# Patient Record
Sex: Female | Born: 1950
Health system: Southern US, Community
[De-identification: ages and names within clinical notes are randomized; demographics above are authoritative.]

## PROBLEM LIST (undated history)

## (undated) DIAGNOSIS — G43909 Migraine, unspecified, not intractable, without status migrainosus: Secondary | ICD-10-CM

## (undated) DIAGNOSIS — R7303 Prediabetes: Secondary | ICD-10-CM

## (undated) DIAGNOSIS — H269 Unspecified cataract: Secondary | ICD-10-CM

## (undated) DIAGNOSIS — E7800 Pure hypercholesterolemia, unspecified: Secondary | ICD-10-CM

## (undated) DIAGNOSIS — J189 Pneumonia, unspecified organism: Secondary | ICD-10-CM

## (undated) DIAGNOSIS — F32A Depression, unspecified: Secondary | ICD-10-CM

## (undated) DIAGNOSIS — I10 Essential (primary) hypertension: Secondary | ICD-10-CM

## (undated) DIAGNOSIS — F439 Reaction to severe stress, unspecified: Secondary | ICD-10-CM

## (undated) DIAGNOSIS — F329 Major depressive disorder, single episode, unspecified: Secondary | ICD-10-CM

## (undated) DIAGNOSIS — G40409 Other generalized epilepsy and epileptic syndromes, not intractable, without status epilepticus: Secondary | ICD-10-CM

## (undated) DIAGNOSIS — G40309 Generalized idiopathic epilepsy and epileptic syndromes, not intractable, without status epilepticus: Secondary | ICD-10-CM

## (undated) DIAGNOSIS — R569 Unspecified convulsions: Secondary | ICD-10-CM

## (undated) DIAGNOSIS — F0390 Unspecified dementia without behavioral disturbance: Secondary | ICD-10-CM

## (undated) DIAGNOSIS — T148XXA Other injury of unspecified body region, initial encounter: Secondary | ICD-10-CM

## (undated) HISTORY — PX: VAGINAL HYSTERECTOMY: SUR661

## (undated) HISTORY — DX: Unspecified cataract: H26.9

## (undated) HISTORY — DX: Pure hypercholesterolemia, unspecified: E78.00

## (undated) HISTORY — PX: HERNIA REPAIR: SHX51

---

## 1998-07-08 ENCOUNTER — Ambulatory Visit (HOSPITAL_COMMUNITY): Admission: RE | Admit: 1998-07-08 | Discharge: 1998-07-08 | Payer: Self-pay | Admitting: Family Medicine

## 1998-12-31 ENCOUNTER — Emergency Department (HOSPITAL_COMMUNITY): Admission: EM | Admit: 1998-12-31 | Discharge: 1998-12-31 | Payer: Self-pay

## 1999-05-17 ENCOUNTER — Ambulatory Visit: Admission: RE | Admit: 1999-05-17 | Discharge: 1999-05-17 | Payer: Self-pay | Admitting: *Deleted

## 1999-06-25 ENCOUNTER — Emergency Department (HOSPITAL_COMMUNITY): Admission: EM | Admit: 1999-06-25 | Discharge: 1999-06-25 | Payer: Self-pay | Admitting: Emergency Medicine

## 1999-09-07 ENCOUNTER — Ambulatory Visit (HOSPITAL_COMMUNITY): Admission: RE | Admit: 1999-09-07 | Discharge: 1999-09-07 | Payer: Self-pay | Admitting: Gastroenterology

## 1999-09-07 ENCOUNTER — Encounter (INDEPENDENT_AMBULATORY_CARE_PROVIDER_SITE_OTHER): Payer: Self-pay | Admitting: Specialist

## 1999-11-03 ENCOUNTER — Encounter: Payer: Self-pay | Admitting: Family Medicine

## 1999-11-03 ENCOUNTER — Ambulatory Visit (HOSPITAL_COMMUNITY): Admission: RE | Admit: 1999-11-03 | Discharge: 1999-11-03 | Payer: Self-pay | Admitting: Family Medicine

## 1999-11-28 ENCOUNTER — Ambulatory Visit (HOSPITAL_COMMUNITY): Admission: RE | Admit: 1999-11-28 | Discharge: 1999-11-28 | Payer: Self-pay | Admitting: Gastroenterology

## 1999-11-28 ENCOUNTER — Encounter: Payer: Self-pay | Admitting: Gastroenterology

## 2000-10-14 ENCOUNTER — Emergency Department (HOSPITAL_COMMUNITY): Admission: EM | Admit: 2000-10-14 | Discharge: 2000-10-14 | Payer: Self-pay | Admitting: Emergency Medicine

## 2000-10-14 ENCOUNTER — Encounter: Payer: Self-pay | Admitting: Emergency Medicine

## 2000-11-06 ENCOUNTER — Ambulatory Visit (HOSPITAL_COMMUNITY): Admission: RE | Admit: 2000-11-06 | Discharge: 2000-11-06 | Payer: Self-pay | Admitting: Family Medicine

## 2000-11-06 ENCOUNTER — Encounter: Payer: Self-pay | Admitting: Family Medicine

## 2001-11-18 ENCOUNTER — Encounter: Payer: Self-pay | Admitting: Family Medicine

## 2001-11-18 ENCOUNTER — Ambulatory Visit (HOSPITAL_COMMUNITY): Admission: RE | Admit: 2001-11-18 | Discharge: 2001-11-18 | Payer: Self-pay | Admitting: Family Medicine

## 2002-09-06 ENCOUNTER — Emergency Department (HOSPITAL_COMMUNITY): Admission: EM | Admit: 2002-09-06 | Discharge: 2002-09-06 | Payer: Self-pay | Admitting: Emergency Medicine

## 2002-12-03 ENCOUNTER — Encounter: Payer: Self-pay | Admitting: Family Medicine

## 2002-12-03 ENCOUNTER — Ambulatory Visit (HOSPITAL_COMMUNITY): Admission: RE | Admit: 2002-12-03 | Discharge: 2002-12-03 | Payer: Self-pay | Admitting: Family Medicine

## 2003-01-22 ENCOUNTER — Emergency Department (HOSPITAL_COMMUNITY): Admission: EM | Admit: 2003-01-22 | Discharge: 2003-01-22 | Payer: Self-pay

## 2003-01-26 ENCOUNTER — Encounter: Payer: Self-pay | Admitting: Family Medicine

## 2003-01-26 ENCOUNTER — Ambulatory Visit (HOSPITAL_COMMUNITY): Admission: RE | Admit: 2003-01-26 | Discharge: 2003-01-26 | Payer: Self-pay | Admitting: Family Medicine

## 2003-02-01 ENCOUNTER — Ambulatory Visit (HOSPITAL_COMMUNITY): Admission: RE | Admit: 2003-02-01 | Discharge: 2003-02-01 | Payer: Self-pay | Admitting: Family Medicine

## 2003-02-01 ENCOUNTER — Encounter: Payer: Self-pay | Admitting: Family Medicine

## 2003-08-21 ENCOUNTER — Encounter: Payer: Self-pay | Admitting: Emergency Medicine

## 2003-08-21 ENCOUNTER — Emergency Department (HOSPITAL_COMMUNITY): Admission: EM | Admit: 2003-08-21 | Discharge: 2003-08-21 | Payer: Self-pay

## 2003-12-16 ENCOUNTER — Emergency Department (HOSPITAL_COMMUNITY): Admission: EM | Admit: 2003-12-16 | Discharge: 2003-12-16 | Payer: Self-pay | Admitting: Emergency Medicine

## 2004-04-06 ENCOUNTER — Ambulatory Visit (HOSPITAL_COMMUNITY): Admission: RE | Admit: 2004-04-06 | Discharge: 2004-04-06 | Payer: Self-pay | Admitting: Family Medicine

## 2004-06-16 ENCOUNTER — Emergency Department (HOSPITAL_COMMUNITY): Admission: EM | Admit: 2004-06-16 | Discharge: 2004-06-16 | Payer: Self-pay | Admitting: Emergency Medicine

## 2005-03-29 ENCOUNTER — Ambulatory Visit (HOSPITAL_COMMUNITY): Admission: RE | Admit: 2005-03-29 | Discharge: 2005-03-29 | Payer: Self-pay | Admitting: Gastroenterology

## 2005-04-11 ENCOUNTER — Ambulatory Visit (HOSPITAL_COMMUNITY): Admission: RE | Admit: 2005-04-11 | Discharge: 2005-04-11 | Payer: Self-pay | Admitting: Family Medicine

## 2005-10-06 ENCOUNTER — Emergency Department (HOSPITAL_COMMUNITY): Admission: EM | Admit: 2005-10-06 | Discharge: 2005-10-06 | Payer: Self-pay | Admitting: Emergency Medicine

## 2006-03-11 ENCOUNTER — Other Ambulatory Visit: Admission: RE | Admit: 2006-03-11 | Discharge: 2006-03-11 | Payer: Self-pay | Admitting: Family Medicine

## 2006-06-25 ENCOUNTER — Ambulatory Visit (HOSPITAL_COMMUNITY): Admission: RE | Admit: 2006-06-25 | Discharge: 2006-06-25 | Payer: Self-pay | Admitting: Family Medicine

## 2006-09-03 ENCOUNTER — Emergency Department (HOSPITAL_COMMUNITY): Admission: EM | Admit: 2006-09-03 | Discharge: 2006-09-04 | Payer: Self-pay | Admitting: Emergency Medicine

## 2006-09-12 ENCOUNTER — Emergency Department (HOSPITAL_COMMUNITY): Admission: EM | Admit: 2006-09-12 | Discharge: 2006-09-13 | Payer: Self-pay | Admitting: Emergency Medicine

## 2007-04-29 ENCOUNTER — Ambulatory Visit (HOSPITAL_COMMUNITY): Admission: RE | Admit: 2007-04-29 | Discharge: 2007-04-29 | Payer: Self-pay | Admitting: Family Medicine

## 2008-02-02 ENCOUNTER — Ambulatory Visit (HOSPITAL_COMMUNITY): Admission: RE | Admit: 2008-02-02 | Discharge: 2008-02-02 | Payer: Self-pay | Admitting: Family Medicine

## 2008-02-12 ENCOUNTER — Observation Stay (HOSPITAL_COMMUNITY): Admission: AD | Admit: 2008-02-12 | Discharge: 2008-02-12 | Payer: Self-pay | Admitting: Internal Medicine

## 2008-10-14 ENCOUNTER — Ambulatory Visit: Payer: Self-pay | Admitting: Internal Medicine

## 2008-10-25 LAB — CBC & DIFF AND RETIC
BASO%: 0.6 % (ref 0.0–2.0)
Basophils Absolute: 0 10*3/uL (ref 0.0–0.1)
EOS%: 0.7 % (ref 0.0–7.0)
Eosinophils Absolute: 0.1 10*3/uL (ref 0.0–0.5)
HCT: 35 % (ref 34.8–46.6)
HGB: 11.7 g/dL (ref 11.6–15.9)
IRF: 0.32 (ref 0.130–0.330)
LYMPH%: 35.1 % (ref 14.0–48.0)
MCH: 30 pg (ref 26.0–34.0)
MCHC: 33.4 g/dL (ref 32.0–36.0)
MCV: 89.9 fL (ref 81.0–101.0)
MONO#: 0.7 10*3/uL (ref 0.1–0.9)
MONO%: 10 % (ref 0.0–13.0)
NEUT#: 3.7 10*3/uL (ref 1.5–6.5)
NEUT%: 53.6 % (ref 39.6–76.8)
Platelets: 331 10*3/uL (ref 145–400)
RBC: 3.9 10*6/uL (ref 3.70–5.32)
RDW: 13.6 % (ref 11.3–14.5)
RETIC #: 28.9 10*3/uL (ref 19.7–115.1)
Retic %: 0.7 % (ref 0.4–2.3)
WBC: 7 10*3/uL (ref 3.9–10.0)
lymph#: 2.4 10*3/uL (ref 0.9–3.3)

## 2008-10-27 LAB — COMPREHENSIVE METABOLIC PANEL
ALT: 12 U/L (ref 0–35)
AST: 16 U/L (ref 0–37)
Albumin: 4.6 g/dL (ref 3.5–5.2)
Alkaline Phosphatase: 52 U/L (ref 39–117)
BUN: 29 mg/dL — ABNORMAL HIGH (ref 6–23)
CO2: 27 mEq/L (ref 19–32)
Calcium: 11.3 mg/dL — ABNORMAL HIGH (ref 8.4–10.5)
Chloride: 99 mEq/L (ref 96–112)
Creatinine, Ser: 1.75 mg/dL — ABNORMAL HIGH (ref 0.40–1.20)
Glucose, Bld: 88 mg/dL (ref 70–99)
Potassium: 3.8 mEq/L (ref 3.5–5.3)
Sodium: 140 mEq/L (ref 135–145)
Total Bilirubin: 0.4 mg/dL (ref 0.3–1.2)
Total Protein: 7.6 g/dL (ref 6.0–8.3)

## 2008-10-27 LAB — PROTEIN ELECTROPHORESIS, SERUM
Albumin ELP: 56.2 % (ref 55.8–66.1)
Alpha-1-Globulin: 5.7 % — ABNORMAL HIGH (ref 2.9–4.9)
Alpha-2-Globulin: 14.2 % — ABNORMAL HIGH (ref 7.1–11.8)
Beta 2: 6 % (ref 3.2–6.5)
Beta Globulin: 5.8 % (ref 4.7–7.2)
Gamma Globulin: 12.1 % (ref 11.1–18.8)
Total Protein, Serum Electrophoresis: 7.6 g/dL (ref 6.0–8.3)

## 2008-10-27 LAB — IRON AND TIBC
%SAT: 35 % (ref 20–55)
Iron: 112 ug/dL (ref 42–145)
TIBC: 321 ug/dL (ref 250–470)
UIBC: 209 ug/dL

## 2008-10-27 LAB — HEMOGLOBINOPATHY EVALUATION
Hemoglobin Other: 0 % (ref 0.0–0.0)
Hgb A2 Quant: 2.1 % — ABNORMAL LOW (ref 2.2–3.2)
Hgb A: 97.6 % (ref 96.8–97.8)
Hgb F Quant: 0.3 % (ref 0.0–2.0)
Hgb S Quant: 0 % (ref 0.0–0.0)

## 2008-10-27 LAB — LACTATE DEHYDROGENASE: LDH: 123 U/L (ref 94–250)

## 2008-10-27 LAB — FOLATE RBC: RBC Folate: 936 ng/mL — ABNORMAL HIGH (ref 180–600)

## 2008-10-27 LAB — FERRITIN: Ferritin: 78 ng/mL (ref 10–291)

## 2008-10-27 LAB — VITAMIN B12: Vitamin B-12: 780 pg/mL (ref 211–911)

## 2008-10-27 LAB — ERYTHROPOIETIN: Erythropoietin: 5 m[IU]/mL (ref 2.6–34.0)

## 2008-11-02 LAB — CBC WITH DIFFERENTIAL/PLATELET
BASO%: 0.5 % (ref 0.0–2.0)
Basophils Absolute: 0 10*3/uL (ref 0.0–0.1)
EOS%: 0.6 % (ref 0.0–7.0)
Eosinophils Absolute: 0 10*3/uL (ref 0.0–0.5)
HCT: 32.7 % — ABNORMAL LOW (ref 34.8–46.6)
HGB: 10.9 g/dL — ABNORMAL LOW (ref 11.6–15.9)
LYMPH%: 25.3 % (ref 14.0–48.0)
MCH: 29.9 pg (ref 26.0–34.0)
MCHC: 33.2 g/dL (ref 32.0–36.0)
MCV: 90 fL (ref 81.0–101.0)
MONO#: 0.7 10*3/uL (ref 0.1–0.9)
MONO%: 8.6 % (ref 0.0–13.0)
NEUT#: 5 10*3/uL (ref 1.5–6.5)
NEUT%: 65 % (ref 39.6–76.8)
Platelets: 299 10*3/uL (ref 145–400)
RBC: 3.63 10*6/uL — ABNORMAL LOW (ref 3.70–5.32)
RDW: 13.4 % (ref 11.3–14.5)
WBC: 7.7 10*3/uL (ref 3.9–10.0)
lymph#: 1.9 10*3/uL (ref 0.9–3.3)

## 2009-03-01 ENCOUNTER — Ambulatory Visit (HOSPITAL_COMMUNITY): Admission: RE | Admit: 2009-03-01 | Discharge: 2009-03-01 | Payer: Self-pay | Admitting: Family Medicine

## 2009-05-08 ENCOUNTER — Emergency Department (HOSPITAL_COMMUNITY): Admission: EM | Admit: 2009-05-08 | Discharge: 2009-05-08 | Payer: Self-pay | Admitting: Emergency Medicine

## 2009-05-18 ENCOUNTER — Emergency Department (HOSPITAL_COMMUNITY): Admission: EM | Admit: 2009-05-18 | Discharge: 2009-05-18 | Payer: Self-pay | Admitting: Emergency Medicine

## 2009-07-08 ENCOUNTER — Emergency Department (HOSPITAL_COMMUNITY): Admission: EM | Admit: 2009-07-08 | Discharge: 2009-07-08 | Payer: Self-pay | Admitting: Emergency Medicine

## 2009-09-15 ENCOUNTER — Emergency Department (HOSPITAL_COMMUNITY): Admission: EM | Admit: 2009-09-15 | Discharge: 2009-09-16 | Payer: Self-pay | Admitting: Emergency Medicine

## 2009-09-15 ENCOUNTER — Encounter: Admission: RE | Admit: 2009-09-15 | Discharge: 2009-09-15 | Payer: Self-pay | Admitting: Family Medicine

## 2010-01-02 ENCOUNTER — Other Ambulatory Visit: Admission: RE | Admit: 2010-01-02 | Discharge: 2010-01-02 | Payer: Self-pay | Admitting: Family Medicine

## 2010-03-02 ENCOUNTER — Ambulatory Visit (HOSPITAL_COMMUNITY): Admission: RE | Admit: 2010-03-02 | Discharge: 2010-03-02 | Payer: Self-pay | Admitting: Family Medicine

## 2010-04-25 ENCOUNTER — Encounter: Admission: RE | Admit: 2010-04-25 | Discharge: 2010-04-25 | Payer: Self-pay | Admitting: Family Medicine

## 2011-02-01 ENCOUNTER — Emergency Department (HOSPITAL_COMMUNITY)
Admission: EM | Admit: 2011-02-01 | Discharge: 2011-02-01 | Disposition: A | Discharge status: Home / Self Care | Payer: BC Managed Care – PPO | Attending: Emergency Medicine | Admitting: Emergency Medicine

## 2011-02-01 DIAGNOSIS — E785 Hyperlipidemia, unspecified: Secondary | ICD-10-CM | POA: Insufficient documentation

## 2011-02-01 DIAGNOSIS — G43909 Migraine, unspecified, not intractable, without status migrainosus: Secondary | ICD-10-CM | POA: Insufficient documentation

## 2011-02-01 DIAGNOSIS — H53149 Visual discomfort, unspecified: Secondary | ICD-10-CM | POA: Insufficient documentation

## 2011-02-01 DIAGNOSIS — Z79899 Other long term (current) drug therapy: Secondary | ICD-10-CM | POA: Insufficient documentation

## 2011-02-01 DIAGNOSIS — K219 Gastro-esophageal reflux disease without esophagitis: Secondary | ICD-10-CM | POA: Insufficient documentation

## 2011-02-01 DIAGNOSIS — I10 Essential (primary) hypertension: Secondary | ICD-10-CM | POA: Insufficient documentation

## 2011-02-01 DIAGNOSIS — E7800 Pure hypercholesterolemia, unspecified: Secondary | ICD-10-CM | POA: Insufficient documentation

## 2011-02-01 DIAGNOSIS — G40909 Epilepsy, unspecified, not intractable, without status epilepticus: Secondary | ICD-10-CM | POA: Insufficient documentation

## 2011-02-28 ENCOUNTER — Other Ambulatory Visit (HOSPITAL_COMMUNITY): Payer: Self-pay | Admitting: Family Medicine

## 2011-02-28 DIAGNOSIS — Z1231 Encounter for screening mammogram for malignant neoplasm of breast: Secondary | ICD-10-CM

## 2011-03-14 ENCOUNTER — Ambulatory Visit (HOSPITAL_COMMUNITY)
Admission: RE | Admit: 2011-03-14 | Discharge: 2011-03-14 | Disposition: A | Discharge status: Home / Self Care | Payer: BC Managed Care – PPO | Source: Ambulatory Visit | Attending: Family Medicine | Admitting: Family Medicine

## 2011-03-14 DIAGNOSIS — Z1231 Encounter for screening mammogram for malignant neoplasm of breast: Secondary | ICD-10-CM

## 2011-05-08 NOTE — Discharge Summary (Signed)
NAMEMERCEDEZ, BOULE                ACCOUNT NO.:  [id number]   MEDICAL RECORD NO.:  [id number]          PATIENT TYPE:  EMS   LOCATION:  ED                           FACILITY:  [facility]   PHYSICIAN:  Corinna L. Lendell Caprice, MDDATE OF BIRTH:  [birth year]   DATE OF ADMISSION:  02/11/2008  DATE OF DISCHARGE:  02/12/2008                               DISCHARGE SUMMARY   DISCHARGE DIAGNOSES:  1. Chest pain, suspect gastrointestinal etiology.  2. Gastroesophageal reflux disease.  3. Seizure disorder.  4. History of migraines.  5. Hypertension.   DISCHARGE MEDICATIONS:  The same as upon admission, but if she continues  to have symptoms, I have encouraged her to discuss stopping Indocin with  Dr. Meryl Crutch.   CONDITION:  Stable.   ACTIVITY:  Ad lib.   FOLLOWUP:  Follow up with Dr. Meryl Crutch and/or Dr. Katrinka Blazing as needed.   DIET:  Low salt.   CONSULTATIONS:  None.   PROCEDURES:  None.   LABORATORY DATA:  CBC unremarkable.  D-dimer less than 0.22.  Complete  metabolic panel significant for a glucose of 155; otherwise,  unremarkable.  Lipase normal.  Serial cardiac enzymes negative.   SPECIAL STUDIES/RADIOLOGY:  EKG showed normal sinus rhythm with  nonspecific changes.  Chest x-ray negative.   HISTORY AND HOSPITAL COURSE:  Ms. Glazer is a 60 year old female patient  of Dr. Katrinka Blazing who presented with intermittent substernal chest pain  several times a day for the past 3 days.  She reports having had a  stress test a year ago with Dr. Carolanne Grumbling which was reportedly  normal.  I personally called Dr. Norris Cross office and had not yet  received the report of the stress test.  The patient's chest pain felt  like indigestion.  She has a history of reflux and ulcers and is on both  Cytotec and Nexium, but also takes Indocin.  Her chest pain was non  reproducible, and she had a normal exam, normal vital signs.  She had no  further chest pain.  I suspect this is possibly related to the Indocin  and  recommended that if she continues to have symptoms to discuss  alternate treatments for migraines.  Apparently, she suffers from  frequent severe migraines and his hesitant to stop this.  She was placed  on 23-hour observation on telemetry and remained in normal sinus rhythm.      Corinna L. Lendell Caprice, MD  Electronically Signed     CLS/MEDQ  D:  02/12/2008  T:  02/12/2008  Job:  [id number]   cc:   Dario Guardian, M.D.

## 2011-05-08 NOTE — H&P (Signed)
[name], [name]                ACCOUNT NO.:  [id number]   MEDICAL RECORD NO.:  [id number]          PATIENT TYPE:  EMS   LOCATION:  ED                           FACILITY:  [facility]   PHYSICIAN:  [name], MDDATE OF BIRTH:  02-17-1951   DATE OF ADMISSION:  02/11/2008  DATE OF DISCHARGE:                              HISTORY & PHYSICAL   CHIEF COMPLAINT:  Chest pain.   HISTORY OF PRESENT ILLNESS:  [name] is a 60 year old female patient  of [name] who presents with intermittent chest pain for the  past several days.  It lasts about a minute each time and feels like  indigestion.  Initially, she told the resident working in the ER that it  was exertional.  She tells me that there is no correlation to exertion.  She feels short of breath with it and she cannot recall having had this  sensation before, but she thought it may be indigestion.  She reportedly  had a stress test at [name]'s office last year which was  reportedly negative.  She cannot recall whether she had chest pain at  the time, but her husband reports that she did.  She thinks that the  test was normal.  She has no chest pain currently.  She has a history of  acid reflux and ulcers.  She has had no cough.  No fevers or chills.  No  wheezing.   PAST MEDICAL HISTORY:  1. Hyperlipidemia.  2. Gastroesophageal reflux disease.  3. History of peptic ulcers.  4. Migraine headaches.  5. Hypertension.  6. Seizure disorder.   MEDICATIONS:  1. Lamictal 200 mg twice a day.  2. Cytotec 200 mg twice a day.  3. Indocin 25 mg twice a day.  4. Lipitor 40 mg a day.  5. Verapamil 240 mg a day.  6. Atenolol 100 mg a day.  7. Nexium 40 mg a day.  8. Benicar/HCT 40/25 mg a day.  9. Zanaflex as needed.   ALLERGIES:  She reports an intolerance to ASPIRIN--ulcers.   SOCIAL HISTORY:  The patient works as a Advertising copywriter.  She denies  drinking, drugs or smoking.   FAMILY HISTORY:  Negative for early  coronary artery disease or clotting  disorders.   REVIEW OF SYSTEMS:  As above, otherwise negative.   PHYSICAL EXAMINATION:  VITAL SIGNS:  Temperature is 98.4, blood pressure  144/83, pulse 84, respiratory rate 16, oxygen saturation 97% on room  air.  GENERAL:  The patient is well-nourished, well-developed in no acute  distress.  HEENT:  Normocephalic, atraumatic.  Pupils are equal, round and reactive  to light.  Sclerae are nonicteric.  Moist mucous membranes.  NECK:  Supple.  No carotid bruits.  LUNGS:  Clear to auscultation bilaterally without wheezes, rhonchi or  rales.  CARDIOVASCULAR:  Regular rate and rhythm without murmurs,  gallops or rubs.  No chest wall tenderness.  ABDOMEN:  Soft, nontender, nondistended.  GU/RECTAL:  Deferred.  EXTREMITIES:  No clubbing, cyanosis or edema.  SKIN:  No rash.  PSYCHIATRIC:  Flat affect.  NEUROLOGIC:  Alert and oriented.  Cranial nerves and sensorimotor exam  are intact.   LABORATORY DATA:  CBC unremarkable.  Basic metabolic panel significant  for a glucose of 155, otherwise unremarkable.  Point of care enzymes  negative.   DIAGNOSTICS:  1. EKG showed normal sinus rhythm.  2. Chest x-ray negative.   ASSESSMENT/PLAN:  1. Chest pain.  The patient will be placed on 23-hour observation.  I      will check liver function tests, lipase and D-dimer.  Rule out MI.      She will be placed on telemetry, give Plavix for now as she is      refusing aspirin.  Continue Nexium.  2. Hypertension.  Continue outpatient medications.  3. Seizure disorder.  Continue outpatient medications.  4. Gastroesophageal reflux disease and history of ulcers.  5. Hyperglycemia.  I will check a hemoglobin A1c.      Michelle L. Lendell Caprice, MD  Electronically Signed     CLS/MEDQ  D:  02/11/2008  T:  02/12/2008  Job:  [id number]   cc:   [name], M.D.  Fax: [phone]   [name], M.D.  Fax: [phone]

## 2011-05-11 NOTE — Op Note (Signed)
NAMEJANYIAH, [name]                ACCOUNT NO.:  [id number]   MEDICAL RECORD NO.:  [id number]          PATIENT TYPE:  AMB   LOCATION:  ENDO                         FACILITY:  [facility]   PHYSICIAN:  Petra Kuba, M.D.    DATE OF BIRTH:  Aug 24, 1951   DATE OF PROCEDURE:  03/29/2005  DATE OF DISCHARGE:                                 OPERATIVE REPORT   PROCEDURE:  Colonoscopy.   INDICATIONS:  Chronic anemia screening. Consent was signed after risks,  benefits, methods, and options thoroughly discussed in the office on  multiple occasions.   MEDICATIONS:  Demerol 60, Versed 6.   PROCEDURE:  Rectal inspection pertinent for small external hemorrhoids.  Digital exam was negative. Video pediatric adjustable colonoscope was  inserted, easily advanced around the colon to the cecum. This did require  some abdominal pressure but no position changes. On insertion, some  occasional left sided diverticula were seen. The ascending stricture was  seen before was seen and seem to be more widely patent in the past. The  pediatric colonoscopy easily passed through it and advanced to the cecum  which was identified by the appendiceal orifice and ileocecal valve. Scope  was inserted a short ways into the terminal ileum which was normal. Photo  documentation was obtained. The scope was slowly withdrawn. Prep was  adequate. There was some liquid stool that required washing and suctioning.  On slow withdrawn through the colon, an occasional left sided diverticula  and the ascending ring/stricture as discussed above. No other abnormalities  were seen as we slowly withdrew back to the rectum. The prep was adequate.  There was some liquid stool that required washing and suctioning. Anorectal  pull through and retroflexion confirmed some small hemorrhoids. Scope was  straightened and readvanced short ways up the left side of the colon. Air  was suctioned, scope removed. The patient tolerated the procedure well.  There was no obvious immediate complication.   ENDOSCOPIC DIAGNOSES:  1.  Internal and external hemorrhoids.  2.  Left sided occasional diverticula.  3.  Ring/stricture of the proximal ascending seen previously, widely patent      now.  4.  Otherwise within normal limits to the terminal ileum.   PLAN:  Return care to Dr. Katrinka Blazing for the customary health maintanence to  include yearly rectals and guaiacs. Repeat EGD p.r.n. Happy to see back  p.r.n. and otherwise followup colon probably in 5 to 10 years.      MEM/MEDQ  D:  03/29/2005  T:  03/29/2005  Job:  [id number]   cc:   Dario Guardian, M.D.  [address]  [place]  [place] [address]  Fax: [phone]

## 2011-06-03 ENCOUNTER — Emergency Department (HOSPITAL_COMMUNITY)
Admission: EM | Admit: 2011-06-03 | Discharge: 2011-06-04 | Disposition: A | Discharge status: Home / Self Care | Payer: BC Managed Care – PPO | Attending: Emergency Medicine | Admitting: Emergency Medicine

## 2011-06-03 DIAGNOSIS — K219 Gastro-esophageal reflux disease without esophagitis: Secondary | ICD-10-CM | POA: Insufficient documentation

## 2011-06-03 DIAGNOSIS — G43909 Migraine, unspecified, not intractable, without status migrainosus: Secondary | ICD-10-CM | POA: Insufficient documentation

## 2011-06-03 DIAGNOSIS — H53149 Visual discomfort, unspecified: Secondary | ICD-10-CM | POA: Insufficient documentation

## 2011-06-03 DIAGNOSIS — G40909 Epilepsy, unspecified, not intractable, without status epilepticus: Secondary | ICD-10-CM | POA: Insufficient documentation

## 2011-06-03 DIAGNOSIS — R110 Nausea: Secondary | ICD-10-CM | POA: Insufficient documentation

## 2011-06-03 DIAGNOSIS — Z79899 Other long term (current) drug therapy: Secondary | ICD-10-CM | POA: Insufficient documentation

## 2011-06-03 DIAGNOSIS — E7800 Pure hypercholesterolemia, unspecified: Secondary | ICD-10-CM | POA: Insufficient documentation

## 2011-06-03 DIAGNOSIS — E785 Hyperlipidemia, unspecified: Secondary | ICD-10-CM | POA: Insufficient documentation

## 2011-06-03 DIAGNOSIS — I10 Essential (primary) hypertension: Secondary | ICD-10-CM | POA: Insufficient documentation

## 2011-09-14 LAB — CBC
HCT: 35.4 — ABNORMAL LOW
Hemoglobin: 12
MCHC: 33.8
MCV: 88
Platelets: 341
RBC: 4.02
RDW: 13.1
WBC: 8.1

## 2011-09-14 LAB — BASIC METABOLIC PANEL
BUN: 16
CO2: 29
Calcium: 9.5
Chloride: 104
Creatinine, Ser: 1.16
GFR calc Af Amer: 58 — ABNORMAL LOW
GFR calc non Af Amer: 48 — ABNORMAL LOW
Glucose, Bld: 155 — ABNORMAL HIGH
Potassium: 3.7
Sodium: 141

## 2011-09-14 LAB — CK TOTAL AND CKMB (NOT AT ARMC)
CK, MB: 1.5
CK, MB: 2.1
Relative Index: 1.1
Relative Index: 1.2
Total CK: 140
Total CK: 176

## 2011-09-14 LAB — HEPATIC FUNCTION PANEL
ALT: 21
AST: 21
Albumin: 3.6
Alkaline Phosphatase: 57
Bilirubin, Direct: <0.1
Total Bilirubin: 0.3
Total Protein: 6.8

## 2011-09-14 LAB — D-DIMER, QUANTITATIVE: D-Dimer, Quant: <0.22

## 2011-09-14 LAB — HEMOGLOBIN A1C
Hgb A1c MFr Bld: 6.5 — ABNORMAL HIGH
Mean Plasma Glucose: 154

## 2011-09-14 LAB — POCT CARDIAC MARKERS
CKMB, poc: 1
Myoglobin, poc: 81.8
Operator id: 5362
Troponin i, poc: <0.05

## 2011-09-14 LAB — TROPONIN I
Troponin I: 0.01
Troponin I: 0.02

## 2011-09-14 LAB — LIPASE, BLOOD: Lipase: 31

## 2012-02-26 ENCOUNTER — Other Ambulatory Visit (HOSPITAL_COMMUNITY): Payer: Self-pay | Admitting: Family Medicine

## 2012-02-26 DIAGNOSIS — Z1231 Encounter for screening mammogram for malignant neoplasm of breast: Secondary | ICD-10-CM

## 2012-03-25 ENCOUNTER — Ambulatory Visit (HOSPITAL_COMMUNITY)
Admission: RE | Admit: 2012-03-25 | Discharge: 2012-03-25 | Disposition: A | Discharge status: Home / Self Care | Payer: BC Managed Care – PPO | Source: Ambulatory Visit | Attending: Family Medicine | Admitting: Family Medicine

## 2012-03-25 DIAGNOSIS — Z1231 Encounter for screening mammogram for malignant neoplasm of breast: Secondary | ICD-10-CM | POA: Insufficient documentation

## 2012-04-02 ENCOUNTER — Other Ambulatory Visit: Payer: Self-pay | Admitting: Family Medicine

## 2012-04-02 DIAGNOSIS — R1900 Intra-abdominal and pelvic swelling, mass and lump, unspecified site: Secondary | ICD-10-CM

## 2012-04-03 ENCOUNTER — Ambulatory Visit
Admission: RE | Admit: 2012-04-03 | Discharge: 2012-04-03 | Disposition: A | Discharge status: Home / Self Care | Payer: BC Managed Care – PPO | Source: Ambulatory Visit | Attending: Family Medicine | Admitting: Family Medicine

## 2012-04-03 DIAGNOSIS — R1900 Intra-abdominal and pelvic swelling, mass and lump, unspecified site: Secondary | ICD-10-CM

## 2012-04-10 ENCOUNTER — Other Ambulatory Visit: Payer: Self-pay | Admitting: *Deleted

## 2012-04-10 ENCOUNTER — Other Ambulatory Visit: Payer: Self-pay | Admitting: Obstetrics and Gynecology

## 2012-04-10 ENCOUNTER — Ambulatory Visit
Admission: RE | Admit: 2012-04-10 | Discharge: 2012-04-10 | Disposition: A | Discharge status: Home / Self Care | Payer: BC Managed Care – PPO | Source: Ambulatory Visit | Attending: Obstetrics and Gynecology | Admitting: Obstetrics and Gynecology

## 2012-04-10 DIAGNOSIS — R1900 Intra-abdominal and pelvic swelling, mass and lump, unspecified site: Secondary | ICD-10-CM

## 2012-04-10 MED ORDER — IOHEXOL 300 MG/ML  SOLN
30.0000 mL | Freq: Once | INTRAMUSCULAR | Status: AC | PRN
  Administered 2012-04-10: 30 mL via ORAL

## 2012-04-10 MED ORDER — IOHEXOL 300 MG/ML  SOLN
100.0000 mL | Freq: Once | INTRAMUSCULAR | Status: AC | PRN
  Administered 2012-04-10: 100 mL via INTRAVENOUS

## 2012-10-11 ENCOUNTER — Emergency Department (HOSPITAL_COMMUNITY)
Admission: EM | Admit: 2012-10-11 | Discharge: 2012-10-11 | Disposition: A | Discharge status: Home / Self Care | Payer: BC Managed Care – PPO | Attending: Emergency Medicine | Admitting: Emergency Medicine

## 2012-10-11 ENCOUNTER — Encounter (HOSPITAL_COMMUNITY): Payer: Self-pay

## 2012-10-11 DIAGNOSIS — G43909 Migraine, unspecified, not intractable, without status migrainosus: Secondary | ICD-10-CM | POA: Insufficient documentation

## 2012-10-11 DIAGNOSIS — Z79899 Other long term (current) drug therapy: Secondary | ICD-10-CM | POA: Insufficient documentation

## 2012-10-11 HISTORY — DX: Migraine, unspecified, not intractable, without status migrainosus: G43.909

## 2012-10-11 HISTORY — DX: Unspecified convulsions: R56.9

## 2012-10-11 MED ORDER — METOCLOPRAMIDE HCL 10 MG PO TABS: 10.0000 mg | ORAL_TABLET | Freq: Four times a day (QID) | ORAL | Status: DC | PRN

## 2012-10-11 MED ORDER — DIPHENHYDRAMINE HCL 50 MG/ML IJ SOLN
25.0000 mg | Freq: Once | INTRAMUSCULAR | Status: AC
  Administered 2012-10-11: 25 mg via INTRAVENOUS
  Filled 2012-10-11: qty 1

## 2012-10-11 MED ORDER — METOCLOPRAMIDE HCL 5 MG/ML IJ SOLN
10.0000 mg | Freq: Once | INTRAMUSCULAR | Status: AC
  Administered 2012-10-11: 10 mg via INTRAVENOUS
  Filled 2012-10-11: qty 2

## 2012-10-11 MED ORDER — OXYCODONE-ACETAMINOPHEN 5-325 MG PO TABS: 1.0000 | ORAL_TABLET | ORAL | Status: DC | PRN

## 2012-10-11 MED ORDER — SODIUM CHLORIDE 0.9 % IV BOLUS (SEPSIS)
1000.0000 mL | Freq: Once | INTRAVENOUS | Status: AC
  Administered 2012-10-11: 1000 mL via INTRAVENOUS

## 2012-10-11 MED ORDER — OXYCODONE-ACETAMINOPHEN 5-325 MG PO TABS
1.0000 | ORAL_TABLET | Freq: Once | ORAL | Status: AC
  Administered 2012-10-11: 1 via ORAL
  Filled 2012-10-11: qty 1

## 2012-10-11 NOTE — ED Notes (Addendum)
Pt reports migraine headache and sensitivity to light starting last night, pt denies N/V, pt reports taking Topamax, Tylenol and Depakote w/no relief. No neuro deficits noted

## 2012-10-11 NOTE — ED Notes (Signed)
Pt reports she is unable "to think" what surgeries she has had in the past, RN in the back will need to f/u

## 2012-10-11 NOTE — ED Provider Notes (Signed)
History  This chart was scribed for Dione Booze, MD by Bennett Scrape. This patient was seen in room TR11C/TR11C and the patient's care was started at 6:07PM.  CSN: [id number]  Arrival date & time 10/11/12  1612   First MD Initiated Contact with Patient 10/11/12 1807      Chief Complaint  Patient presents with  . Migraine    The history is provided by the patient. No language interpreter was used.    CARLISS BLATT is a 61 y.o. female with a h/o migraines who presents to the Emergency Department complaining of gradual onset, gradually worsening, constant left sided HA described as achy and non-radiating located over the left eye since yesterday. She rates her pain a 5 out of 10 currently after taking Percocet and a 10 out of 10 before. She states that the pain is worse with loud noises and bright lights but denies any improving factors. She reports that she has been experiencing similar intermittent HAs for the past week which is the typical pattern for her migraines. She denies fever, nausea, emesis, congestion and rash as associated symptoms. She also has a h/o seizures and denies smoking and alcohol use.  PCP is [facility].  Past Medical History  Diagnosis Date  . Migraine   . Seizures     History reviewed. No pertinent past surgical history.  History reviewed. No pertinent family history.  History  Substance Use Topics  . Smoking status: Never Smoker   . Smokeless tobacco: Not on file  . Alcohol Use: No    No OB history provided.  Review of Systems  Constitutional: Negative for fever and chills.  Gastrointestinal: Negative for nausea and vomiting.  Neurological: Positive for headaches. Negative for weakness and numbness.  All other systems reviewed and are negative.    Allergies  Aspirin-gets ulcers  Home Medications   Current Outpatient Rx  Name Route Sig Dispense Refill  . ATORVASTATIN CALCIUM 40 MG PO TABS Oral Take 40 mg by mouth daily.    .  CYCLOBENZAPRINE HCL 10 MG PO TABS Oral Take 10 mg by mouth 3 (three) times daily as needed. For pain    . DIVALPROEX SODIUM ER 500 MG PO TB24 Oral Take 500 mg by mouth 3 (three) times daily.    Marland Kitchen ESOMEPRAZOLE MAGNESIUM 40 MG PO CPDR Oral Take 40 mg by mouth daily before breakfast.    . GABAPENTIN 300 MG PO CAPS Oral Take 300 mg by mouth 3 (three) times daily.    . IRON PO Oral Take 1 tablet by mouth daily.    Marland Kitchen MAGNESIUM OXIDE 400 MG PO TABS Oral Take 400 mg by mouth daily.    . ADULT MULTIVITAMIN W/MINERALS CH Oral Take 1 tablet by mouth daily.    Marland Kitchen OLMESARTAN MEDOXOMIL 40 MG PO TABS Oral Take 40 mg by mouth daily.    . TOPIRAMATE 100 MG PO TABS Oral Take 200 mg by mouth daily.    Marland Kitchen VERAPAMIL HCL ER 240 MG PO TBCR Oral Take 240 mg by mouth at bedtime.      Triage Vitals: BP 136/114  Pulse 84  Temp 97.9 F (36.6 C) (Oral)  Resp 18  SpO2 98%  Physical Exam  Nursing note and vitals reviewed. Constitutional: She is oriented to person, place, and time. She appears well-developed and well-nourished. No distress.       Appears uncomfortable  HENT:  Head: Normocephalic and atraumatic.  Eyes: Conjunctivae normal and EOM are normal. Pupils  are equal, round, and reactive to light.       Fundi are normal  Neck: Neck supple. No tracheal deviation present.  Cardiovascular: Normal rate and regular rhythm.   Pulmonary/Chest: Effort normal and breath sounds normal. No respiratory distress.  Musculoskeletal: Normal range of motion.  Neurological: She is alert and oriented to person, place, and time.  Skin: Skin is warm and dry.  Psychiatric: She has a normal mood and affect. Her behavior is normal.    ED Course  Procedures (including critical care time)  DIAGNOSTIC STUDIES: Oxygen Saturation is 98% on room air, normal by my interpretation.    COORDINATION OF CARE: 6:11PM-Discussed treatment plan which includes pain medications with pt at bedside and pt agreed to plan.  6:15PM-Ordered  1,000 mL of Bolus, 10 mg injection of Reglan, 25 mg injection of Benadryl   7:30PM-Pt rechecked and feels improved. She is resting comfortably. Discussed discharge plan of pain medication and Reglan with pt at bedside and pt agreed to plan.  7:33PM-Ordered 10 mg Reglan and 5-325 mg Roxicet   1. Migraine headache       MDM  Migraine headache. She got partial relief from Percocet prior to my seeing her. She will be given a headache cocktail and reassessed.  She got good relief from Metoclopramide and Diphenhydramine. She is sent home with prescriptions for Metoclopramide, and Percocet.   I personally performed the services described in this documentation, which was scribed in my presence. The recorded information has been reviewed and considered.    Dione Booze, MD 10/11/12 [phone]

## 2012-10-11 NOTE — ED Notes (Signed)
Pt c/o HA with senitivity to light and sound.

## 2013-03-20 ENCOUNTER — Other Ambulatory Visit (HOSPITAL_COMMUNITY): Payer: Self-pay | Admitting: Family Medicine

## 2013-03-20 DIAGNOSIS — Z1231 Encounter for screening mammogram for malignant neoplasm of breast: Secondary | ICD-10-CM

## 2013-03-23 ENCOUNTER — Other Ambulatory Visit: Payer: Self-pay | Admitting: Neurology

## 2013-03-31 ENCOUNTER — Encounter: Payer: Self-pay | Admitting: Neurology

## 2013-03-31 ENCOUNTER — Ambulatory Visit (INDEPENDENT_AMBULATORY_CARE_PROVIDER_SITE_OTHER): Payer: BC Managed Care – PPO | Admitting: Neurology

## 2013-03-31 VITALS — BP 125/75 | HR 74 | Ht 59.0 in | Wt 142.0 lb

## 2013-03-31 DIAGNOSIS — R51 Headache: Secondary | ICD-10-CM

## 2013-03-31 DIAGNOSIS — G8929 Other chronic pain: Secondary | ICD-10-CM

## 2013-03-31 MED ORDER — MAGNESIUM OXIDE 400 MG PO TABS: 400.0000 mg | ORAL_TABLET | Freq: Two times a day (BID) | ORAL | Status: DC

## 2013-03-31 MED ORDER — TOPIRAMATE 100 MG PO TABS: 100.0000 mg | ORAL_TABLET | Freq: Two times a day (BID) | ORAL | Status: DC

## 2013-03-31 MED ORDER — RIBOFLAVIN 100 MG PO TABS: 100.0000 mg | ORAL_TABLET | Freq: Two times a day (BID) | ORAL | Status: DC

## 2013-03-31 NOTE — Progress Notes (Signed)
HPI:  62 year old right-handed African American married female with daily left hemicranial headaches lasting 30 minutes to 1-1/2 hours usually occurring 5 or 6 times per day, previous patient of Dr. Sandria Manly, now with her husband for follow up.  Her headaches are always on the left side. She also has a history of complex partial  seizures but  none in many years, last seizure was 2007.  She had an MRI studies of her brain 05/2009, 09/15/09, and 07/02/12 showing  stable en plaque meningiomas, one over the left lateral convexity and the other in the  middle of the anterior fossa.   There is improvement in the frequency and severity of her headaches with the use of Indomethacin . It cuts down her headaches approximately 75%.  Indomethacin is associated with elevated creatinine. She is followed by Dr.Webb. Her creatinine runs 1.19. Because of this she discontinued her indomethacin .  She tried Botox by Dr. Jodi Mourning and [place] without benefit . Nasal oxygen increases the speed  with which her headache pain relief occurs. She developed headaches  in February 2012 and went to the emergency room. She went to the emergency room 10/19/213 with headaches. She continued to have headaches after her ER visit. They are worse at night than during the day. They are not associated with eyelid droop, nasal stuffiness, or tearing. She has gained weight on divalproex sodium.  UPDATE April 8th 2014: She continues to have left side headaches, about five-six times a day, lasting less than one hour.  She has stopped working because of headaches,  She is alternating between tylenol, tizanidin, flexeril, Reglan, oxygen during headaches  Imitrex does not work, worry about its over use too.  Overall , she is happy about current treatment    Physical Exam  General: well-developed female.   PHYSICAL EXAMINATOINS:  Generalized: In no acute distress  Neck: Supple, no carotid bruits   Cardiac: Regular rate rhythm  Pulmonary: Clear  to auscultation bilaterally  Musculoskeletal: No deformity  Neurological examination  Mentation: depressed looking middle aged female Alert oriented to time, place, history taking, and causual conversation  Cranial nerve II-XII: Pupils were equal round reactive to light extraocular movements were full, visual field were full on confrontational test. facial sensation and strength were normal. hearing was intact to finger rubbing bilaterally. Uvula tongue midline.  head turning and shoulder shrug and were normal and symmetric.Tongue protrusion into cheek strength was normal.  Motor: normal tone, bulk and strength.  Sensory: Intact to fine touch, pinprick, preserved vibratory sensation, and proprioception at toes.  Coordination: Normal finger to nose, heel-to-shin bilaterally there was no truncal ataxia  Gait: Rising up from seated position without assistance, normal stance, without trunk ataxia, moderate stride, good arm swing, smooth turning, able to perform tiptoe, and heel walking without difficulty.   Romberg signs: Negative  Deep tendon reflexes: Brachioradialis 2/2, biceps 2/2, triceps 2/2, patellar 2/2, Achilles 2/2, plantar responses were flexor bilaterally.  Assessment and plan;  62  African American female with  Chronic paroxysmal hemicranial headachesIs stable on current medications, including alternating dose of Tylenol, tizanidine, Flexeril, Reglan, high flow oxygen, Refill meds, Return to clinic in 6 months with Eber Jones

## 2013-04-01 DIAGNOSIS — G8929 Other chronic pain: Secondary | ICD-10-CM | POA: Insufficient documentation

## 2013-04-29 ENCOUNTER — Telehealth: Payer: Self-pay | Admitting: Neurology

## 2013-04-29 MED ORDER — ZOLMITRIPTAN 5 MG NA SOLN: 5.0000 | NASAL | Status: DC | PRN

## 2013-04-29 NOTE — Telephone Encounter (Signed)
I have called her, she complains of 2 weeks of worsening headaches, previously maxalt and imitrex do not work for her, I called in zomig nasal spray prn.

## 2013-04-29 NOTE — Telephone Encounter (Signed)
I called the patient and she stated she has been having headaches for a while and her pain level has been a 10. Patient stated her headache is on the leftside behind the eye and sometimes from the back of the head.

## 2013-05-05 ENCOUNTER — Ambulatory Visit (HOSPITAL_COMMUNITY)
Admission: RE | Admit: 2013-05-05 | Discharge: 2013-05-05 | Disposition: A | Discharge status: Home / Self Care | Payer: BC Managed Care – PPO | Source: Ambulatory Visit | Attending: Family Medicine | Admitting: Family Medicine

## 2013-05-05 DIAGNOSIS — Z1231 Encounter for screening mammogram for malignant neoplasm of breast: Secondary | ICD-10-CM | POA: Insufficient documentation

## 2013-05-15 ENCOUNTER — Encounter (HOSPITAL_COMMUNITY): Payer: Self-pay | Admitting: *Deleted

## 2013-05-15 ENCOUNTER — Emergency Department (HOSPITAL_COMMUNITY)
Admission: EM | Admit: 2013-05-15 | Discharge: 2013-05-16 | Disposition: A | Discharge status: Home / Self Care | Payer: BC Managed Care – PPO | Attending: Emergency Medicine | Admitting: Emergency Medicine

## 2013-05-15 DIAGNOSIS — G40909 Epilepsy, unspecified, not intractable, without status epilepticus: Secondary | ICD-10-CM | POA: Insufficient documentation

## 2013-05-15 DIAGNOSIS — E7800 Pure hypercholesterolemia, unspecified: Secondary | ICD-10-CM | POA: Insufficient documentation

## 2013-05-15 DIAGNOSIS — Z79899 Other long term (current) drug therapy: Secondary | ICD-10-CM | POA: Insufficient documentation

## 2013-05-15 DIAGNOSIS — G43909 Migraine, unspecified, not intractable, without status migrainosus: Secondary | ICD-10-CM

## 2013-05-15 NOTE — ED Notes (Signed)
Pt states she has history of migraines daily,  She took her prescribed HA medication at 1045, 7/10 left side behind eye pain,,,  Room lights dimmed,  Pt has cold pack holding on head

## 2013-05-16 MED ORDER — METOCLOPRAMIDE HCL 5 MG/ML IJ SOLN
10.0000 mg | Freq: Once | INTRAMUSCULAR | Status: AC
Start: 2013-05-16 — End: 2013-05-16
  Administered 2013-05-16: 10 mg via INTRAVENOUS
  Filled 2013-05-16: qty 2

## 2013-05-16 MED ORDER — KETOROLAC TROMETHAMINE 30 MG/ML IJ SOLN
30.0000 mg | Freq: Once | INTRAMUSCULAR | Status: AC
  Administered 2013-05-16: 30 mg via INTRAVENOUS
  Filled 2013-05-16: qty 1

## 2013-05-16 MED ORDER — DIPHENHYDRAMINE HCL 50 MG/ML IJ SOLN
25.0000 mg | Freq: Once | INTRAMUSCULAR | Status: AC
  Administered 2013-05-16: 25 mg via INTRAVENOUS
  Filled 2013-05-16: qty 1

## 2013-05-16 MED ORDER — SODIUM CHLORIDE 0.9 % IV BOLUS (SEPSIS)
1000.0000 mL | Freq: Once | INTRAVENOUS | Status: AC
  Administered 2013-05-16: 1000 mL via INTRAVENOUS

## 2013-05-16 NOTE — ED Provider Notes (Signed)
History     CSN: [id number]  Arrival date & time 05/15/13  2331   First MD Initiated Contact with Patient 05/16/13 0016      Chief Complaint  Patient presents with  . Migraine    (Consider location/radiation/quality/duration/timing/severity/associated sxs/prior treatment) HPI Patient presents emergency department with migraine headache since been persistent over the last 4 days.  Patient, states, that she does not have any variations for her normal migraine symptoms.  Patient, states she's having photo sensitivity, along with sound sensitivity.  Patient, states, that she's had an nausea, but no vomiting.  She also denies blurred vision, weakness, numbness, fever, back or neck pain.  Patient, states, that she did not take any medications prior to arrival other than her normal migraine medication without relief. Past Medical History  Diagnosis Date  . Migraine   . Seizures   . High cholesterol     History reviewed. No pertinent past surgical history.  Family History  Problem Relation Age of Onset  . Lung cancer Mother     History  Substance Use Topics  . Smoking status: Never Smoker   . Smokeless tobacco: Not on file  . Alcohol Use: No    OB History   Grav Para Term Preterm Abortions TAB SAB Ect Mult Living                  Review of Systems All other systems negative except as documented in the HPI. All pertinent positives and negatives as reviewed in the HPI. Allergies  Aspirin  Home Medications   Current Outpatient Rx  Name  Route  Sig  Dispense  Refill  . acetaZOLAMIDE (DIAMOX) 500 MG capsule   Oral   Take 500 mg by mouth daily.         Marland Kitchen atorvastatin (LIPITOR) 40 MG tablet   Oral   Take 40 mg by mouth daily.         . calcium-vitamin D (OSCAL WITH D) 250-125 MG-UNIT per tablet   Oral   Take 1 tablet by mouth daily.         . cyclobenzaprine (FLEXERIL) 10 MG tablet   Oral   Take 10 mg by mouth 3 (three) times daily as needed. For pain          . divalproex (DEPAKOTE ER) 500 MG 24 hr tablet   Oral   Take 500 mg by mouth 3 (three) times daily.         Marland Kitchen esomeprazole (NEXIUM) 40 MG capsule   Oral   Take 40 mg by mouth daily before breakfast.         . IRON PO   Oral   Take 1 tablet by mouth daily.         . magnesium oxide (MAG-OX) 400 MG tablet   Oral   Take 400 mg by mouth 2 (two) times daily.         . metoCLOPramide (REGLAN) 10 MG tablet   Oral   Take 1 tablet (10 mg total) by mouth every 6 (six) hours as needed (nausea or headache).   30 tablet   0   . Multiple Vitamin (MULTIVITAMIN WITH MINERALS) TABS   Oral   Take 1 tablet by mouth daily.         Marland Kitchen olmesartan (BENICAR) 40 MG tablet   Oral   Take 40 mg by mouth daily.         Marland Kitchen tiZANidine (ZANAFLEX) 4 MG tablet  Oral   Take 4 mg by mouth every 6 (six) hours as needed.         . topiramate (TOPAMAX) 100 MG tablet   Oral   Take 1 tablet (100 mg total) by mouth 2 (two) times daily.   60 tablet   12   . verapamil (CALAN-SR) 240 MG CR tablet   Oral   Take 240 mg by mouth at bedtime.         . vitamin B-12 (CYANOCOBALAMIN) 100 MCG tablet   Oral   Take 50 mcg by mouth daily.         Marland Kitchen zolmitriptan (ZOMIG) 5 MG nasal solution   Nasal   Place 5 sprays into the nose as needed for migraine.   6 Units   12     BP 138/57  Pulse 83  Temp(Src) 98.4 F (36.9 C) (Oral)  Resp 18  Ht 4\' 11"  (1.499 m)  Wt 140 lb (63.504 kg)  BMI 28.26 kg/m2  SpO2 100%  Physical Exam  Nursing note and vitals reviewed. Constitutional: She is oriented to person, place, and time. She appears well-developed and well-nourished. No distress.  HENT:  Head: Normocephalic and atraumatic.  Mouth/Throat: Oropharynx is clear and moist.  Eyes: Conjunctivae and EOM are normal. Pupils are equal, round, and reactive to light.  Neck: Normal range of motion. Neck supple.  Cardiovascular: Normal rate, regular rhythm and normal heart sounds.  Exam reveals no  gallop and no friction rub.   No murmur heard. Pulmonary/Chest: Effort normal and breath sounds normal.  Neurological: She is alert and oriented to person, place, and time. She exhibits normal muscle tone. Coordination normal.  Skin: Skin is warm and dry. No rash noted.    ED Course  Procedures (including critical care time)  Patient is given IV fluids and migraine cocktail is Toradol, Reglan and Benadryl.  Patient is advised followup with her primary care Dr. told to return here for any worsening in her symptoms.  Told to increase her fluid intake at home.  Patient has no neurological deficits noted on exam   MDM          Carlyle Dolly, PA-C 05/16/13 0151

## 2013-05-16 NOTE — ED Provider Notes (Signed)
Medical screening examination/treatment/procedure(s) were performed by non-physician practitioner and as supervising physician I was immediately available for consultation/collaboration.   Gilda Crease, MD 05/16/13 0157

## 2013-05-22 ENCOUNTER — Telehealth: Payer: Self-pay | Admitting: *Deleted

## 2013-05-22 MED ORDER — SUMATRIPTAN SUCCINATE 6 MG/0.5ML ~~LOC~~ SOAJ: 6.0000 mg | SUBCUTANEOUS | Status: DC | PRN

## 2013-05-22 NOTE — Telephone Encounter (Signed)
Patient went to hospital for migraine. Zomig is not helping and need something else. Pt migraines are getting really bad and she can not take the pain.

## 2013-05-22 NOTE — Telephone Encounter (Signed)
I have called her, she has tried zomig nasal spray, did not help her, she went to ED,   Previousely she tried BOTOX only good for one week, I will try BOTOX again.  I called in imitrex Sq.

## 2013-05-25 ENCOUNTER — Telehealth: Payer: Self-pay | Admitting: Neurology

## 2013-05-25 MED ORDER — SUMATRIPTAN SUCCINATE 6 MG/0.5ML ~~LOC~~ SOAJ: 6.0000 mg | SUBCUTANEOUS | Status: DC | PRN

## 2013-05-25 NOTE — Telephone Encounter (Signed)
[name] has print out and will make the correction

## 2013-05-25 NOTE — Telephone Encounter (Signed)
Patient is calling to tell us her medication needs to be sent to [facility] on [address] opposed to [facility].  She needs a call back asap.  [phone]

## 2013-06-16 ENCOUNTER — Telehealth: Payer: Self-pay | Admitting: Neurology

## 2013-06-18 ENCOUNTER — Telehealth: Payer: Self-pay | Admitting: Neurology

## 2013-06-18 MED ORDER — ZOLMITRIPTAN 5 MG NA SOLN: 5.0000 | NASAL | Status: DC | PRN

## 2013-06-18 NOTE — Telephone Encounter (Signed)
Rx sent to [facility] per patient request

## 2013-06-19 NOTE — Telephone Encounter (Signed)
error 

## 2013-07-07 ENCOUNTER — Other Ambulatory Visit: Payer: Self-pay

## 2013-07-07 MED ORDER — METOCLOPRAMIDE HCL 5 MG PO TABS: 5.0000 mg | ORAL_TABLET | Freq: Three times a day (TID) | ORAL | Status: DC | PRN

## 2013-07-07 MED ORDER — CYCLOBENZAPRINE HCL 5 MG PO TABS: 5.0000 mg | ORAL_TABLET | Freq: Three times a day (TID) | ORAL | Status: DC | PRN

## 2013-07-07 MED ORDER — TOPIRAMATE 100 MG PO TABS: 100.0000 mg | ORAL_TABLET | Freq: Two times a day (BID) | ORAL | Status: DC

## 2013-07-07 MED ORDER — TIZANIDINE HCL 2 MG PO TABS: 2.0000 mg | ORAL_TABLET | Freq: Two times a day (BID) | ORAL | Status: DC

## 2013-07-15 ENCOUNTER — Telehealth: Payer: Self-pay | Admitting: Neurology

## 2013-07-15 ENCOUNTER — Other Ambulatory Visit: Payer: Self-pay

## 2013-07-15 MED ORDER — TIZANIDINE HCL 2 MG PO TABS: 2.0000 mg | ORAL_TABLET | Freq: Two times a day (BID) | ORAL | Status: DC

## 2013-07-15 MED ORDER — METOCLOPRAMIDE HCL 5 MG PO TABS: 5.0000 mg | ORAL_TABLET | Freq: Three times a day (TID) | ORAL | Status: DC | PRN

## 2013-07-15 NOTE — Telephone Encounter (Signed)
Rxs sent

## 2013-07-15 NOTE — Telephone Encounter (Signed)
[facility] called.  They state they received Topamax and Flexeril Rx's but did not get Zanaflex and Reglan that were sent all at the same time.  They asked that we resend the last 2 Rx's.  Ref # [id number].

## 2013-08-05 ENCOUNTER — Other Ambulatory Visit: Payer: Self-pay

## 2013-08-05 MED ORDER — DIVALPROEX SODIUM ER 250 MG PO TB24: 250.0000 mg | ORAL_TABLET | Freq: Three times a day (TID) | ORAL | Status: DC

## 2013-09-04 DIAGNOSIS — D3910 Neoplasm of uncertain behavior of unspecified ovary: Secondary | ICD-10-CM | POA: Insufficient documentation

## 2013-09-24 ENCOUNTER — Other Ambulatory Visit: Payer: Self-pay | Admitting: Neurology

## 2013-09-25 ENCOUNTER — Telehealth: Payer: Self-pay | Admitting: *Deleted

## 2013-09-25 NOTE — Telephone Encounter (Signed)
Left message for patient to r/s appt  °

## 2013-09-30 ENCOUNTER — Ambulatory Visit: Payer: Self-pay | Admitting: Nurse Practitioner

## 2013-10-01 ENCOUNTER — Telehealth: Payer: Self-pay

## 2013-10-01 ENCOUNTER — Ambulatory Visit: Payer: Self-pay | Admitting: Nurse Practitioner

## 2013-10-01 NOTE — Telephone Encounter (Signed)
Patient botox was denied . Patient is still having headaches. Patient states she does not want botox . Dr.Yan had a cx so I got her a sooner apt. With Dr.Yan . Discussed with Larita Fife she agreed.

## 2013-10-02 ENCOUNTER — Ambulatory Visit (INDEPENDENT_AMBULATORY_CARE_PROVIDER_SITE_OTHER): Payer: BC Managed Care – PPO | Admitting: Neurology

## 2013-10-02 ENCOUNTER — Encounter (INDEPENDENT_AMBULATORY_CARE_PROVIDER_SITE_OTHER): Payer: Self-pay

## 2013-10-02 ENCOUNTER — Encounter: Payer: Self-pay | Admitting: Neurology

## 2013-10-02 VITALS — BP 138/75 | HR 83 | Ht <= 58 in | Wt 146.0 lb

## 2013-10-02 DIAGNOSIS — G8929 Other chronic pain: Secondary | ICD-10-CM

## 2013-10-02 DIAGNOSIS — R51 Headache: Secondary | ICD-10-CM

## 2013-10-02 MED ORDER — NORTRIPTYLINE HCL 25 MG PO CAPS: 50.0000 mg | ORAL_CAPSULE | Freq: Every day | ORAL | Status: DC

## 2013-10-02 NOTE — Progress Notes (Signed)
HPI:    [name] is a 62 year old right-handed African American married female with daily left hemicranial headaches lasting 30 minutes to 1-1/2 hours usually occurring 5 or 6 times per day, previous patient of Dr. Sandria Manly, now with her husband for follow up.  Her headaches are always on the left side. She also has a history of complex partial  Seizures, last seizure was 2007.  She had an MRI studies of her brain 05/2009, 09/15/09, and 07/02/12 showing  stable en plaque meningiomas, one over the left lateral convexity and the other in the  middle of the anterior fossa.   There is improvement in the frequency and severity of her headaches with the use of Indomethacin . It cuts down her headaches approximately 75%.  Indomethacin is associated with elevated creatinine. She is followed by Dr.Webb. Her creatinine runs 1.19. Because of this she discontinued her indomethacin .  She tried Botox by Dr. Jodi Mourning and Addelman without benefit . Nasal oxygen increases the speed  with which her headache pain relief occurs. She developed headaches  in February 2012 and went to the emergency room. She went to the emergency room 10/19/213 with headaches. She continued to have headaches after her ER visit. They are worse at night than during the day. They are not associated with eyelid droop, nasal stuffiness, or tearing. She has gained weight on divalproex sodium.  UPDATE April 8th 2014: She continues to have left side headaches, about five-six times a day, lasting less than one hour.  She has stopped working because of headaches,  She is alternating between tylenol, tizanidin, flexeril, Reglan, oxygen during headaches.  Imitrex does not work, she worries about its over use too.  Overall , she is happy about current treatment  UPDATE Oct 02 2013: She complains of left side headache, close to her left ear, done to left neck, every day, 5-6 times a day, lasting 45 minutes, She is taking flexeril, tizanidin, reglan for  headaches, she is also taking Depakote, Topamax for headache prevention.   Physical Exam  General: well-developed female.   PHYSICAL EXAMINATOINS:  Generalized: In no acute distress  Neck: Supple, no carotid bruits   Cardiac: Regular rate rhythm  Pulmonary: Clear to auscultation bilaterally  Musculoskeletal: No deformity  Neurological examination  Mentation: depressed looking middle aged female Alert oriented to time, place, history taking, and causual conversation  Cranial nerve II-XII: Pupils were equal round reactive to light extraocular movements were full, visual field were full on confrontational test. facial sensation and strength were normal. hearing was intact to finger rubbing bilaterally. Uvula tongue midline.  head turning and shoulder shrug and were normal and symmetric.Tongue protrusion into cheek strength was normal.  Motor: normal tone, bulk and strength.  Sensory: Intact to fine touch, pinprick, preserved vibratory sensation, and proprioception at toes.  Coordination: Normal finger to nose, heel-to-shin bilaterally there was no truncal ataxia  Gait: Rising up from seated position without assistance, normal stance, without trunk ataxia, moderate stride, good arm swing, smooth turning, able to perform tiptoe, and heel walking without difficulty.   Romberg signs: Negative  Deep tendon reflexes: Brachioradialis 2/2, biceps 2/2, triceps 2/2, patellar 2/2, Achilles 2/2, plantar responses were flexor bilaterally.  Assessment and plan;  58  African American female with chronic paroxysmal hemicranial headachesIs stable on current medications, including alternating dose of Tylenol, tizanidine, Flexeril, Reglan, high flow oxygen, Add on Nortriptyline 25mg  qhs as headache prevention, BOTOX Preauthorization

## 2013-10-07 ENCOUNTER — Ambulatory Visit: Payer: Self-pay | Admitting: Nurse Practitioner

## 2013-10-08 ENCOUNTER — Ambulatory Visit: Payer: Self-pay | Admitting: Nurse Practitioner

## 2013-10-11 ENCOUNTER — Other Ambulatory Visit: Payer: Self-pay

## 2013-10-11 MED ORDER — NORTRIPTYLINE HCL 25 MG PO CAPS: ORAL_CAPSULE | ORAL | Status: DC

## 2013-11-03 ENCOUNTER — Telehealth: Payer: Self-pay | Admitting: *Deleted

## 2013-11-03 NOTE — Telephone Encounter (Signed)
Dr. Terrace Arabia has placed me on Nortriptyline and it is not working. I am taking 2 daily currently. I am wondering if there is something else I can try or can she possibly change the dose.

## 2013-11-05 NOTE — Telephone Encounter (Signed)
I have left message for her. She has been dealing with the same problem for many years, tried different medications in the past, I encourage her to stay on current medication at this time, including depakote, nortriptyline, topamax, tizanidine.   Will discuss further treatment option on follow up visit.

## 2013-11-09 ENCOUNTER — Telehealth: Payer: Self-pay | Admitting: *Deleted

## 2013-11-12 NOTE — Telephone Encounter (Signed)
I spoke to patient and she states that she needs a letter from the doctor that states she needs portable oxygen for her migraines and that it is ok for her to take the portable oxygen concentrator on a fight, up to 3L.  (She submits this to airlines who in turn will have a form for the doctor fill out).  Then she needs another letter that can be faxed down to a supplier in [place] that it is ok to get an oxygen tank from them in case she can't get it from another source.    I think she is also going to need an order from doctor to get the portable oxygenator from [facility].  Please advise.

## 2013-11-16 ENCOUNTER — Encounter: Payer: Self-pay | Admitting: *Deleted

## 2013-11-16 NOTE — Telephone Encounter (Signed)
I have called Hilda Lias, she is using  oxgen at home to abort her head, initial Rx was by Dr. Sandria Manly in 2012, later [facility] was renewing her air tank each month, she is planning on flying to [facility], need a letter.  Fax [phone], attention [employer],  reference No, [id number], air tank, ([employer] as the supply of air tank)

## 2013-11-17 NOTE — Telephone Encounter (Signed)
I called and left a VM for patient that I have faxed letter to [facility] regarding her need for oxygen for migraine abortive treatmetn and that she will need oxygen tank refileld during stay in [place].

## 2013-11-18 ENCOUNTER — Other Ambulatory Visit: Payer: Self-pay | Admitting: Neurology

## 2013-11-18 DIAGNOSIS — G43119 Migraine with aura, intractable, without status migrainosus: Secondary | ICD-10-CM

## 2013-11-18 DIAGNOSIS — G43819 Other migraine, intractable, without status migrainosus: Secondary | ICD-10-CM

## 2013-12-25 ENCOUNTER — Encounter (HOSPITAL_COMMUNITY): Payer: Self-pay | Admitting: Emergency Medicine

## 2013-12-25 ENCOUNTER — Emergency Department (HOSPITAL_COMMUNITY)
Admission: EM | Admit: 2013-12-25 | Discharge: 2013-12-25 | Disposition: A | Discharge status: Home / Self Care | Payer: BC Managed Care – PPO | Attending: Emergency Medicine | Admitting: Emergency Medicine

## 2013-12-25 ENCOUNTER — Emergency Department (HOSPITAL_COMMUNITY): Payer: BC Managed Care – PPO

## 2013-12-25 DIAGNOSIS — E7800 Pure hypercholesterolemia, unspecified: Secondary | ICD-10-CM | POA: Insufficient documentation

## 2013-12-25 DIAGNOSIS — G43909 Migraine, unspecified, not intractable, without status migrainosus: Secondary | ICD-10-CM | POA: Insufficient documentation

## 2013-12-25 DIAGNOSIS — R519 Headache, unspecified: Secondary | ICD-10-CM

## 2013-12-25 DIAGNOSIS — R51 Headache: Secondary | ICD-10-CM

## 2013-12-25 DIAGNOSIS — Z79899 Other long term (current) drug therapy: Secondary | ICD-10-CM | POA: Insufficient documentation

## 2013-12-25 DIAGNOSIS — G40909 Epilepsy, unspecified, not intractable, without status epilepticus: Secondary | ICD-10-CM | POA: Insufficient documentation

## 2013-12-25 MED ORDER — KETOROLAC TROMETHAMINE 30 MG/ML IJ SOLN
30.0000 mg | Freq: Once | INTRAMUSCULAR | Status: AC
  Administered 2013-12-25: 30 mg via INTRAVENOUS
  Filled 2013-12-25: qty 1

## 2013-12-25 MED ORDER — HYDROMORPHONE HCL PF 1 MG/ML IJ SOLN
1.0000 mg | Freq: Once | INTRAMUSCULAR | Status: AC
  Administered 2013-12-25: 1 mg via INTRAVENOUS
  Filled 2013-12-25: qty 1

## 2013-12-25 MED ORDER — ONDANSETRON HCL 4 MG/2ML IJ SOLN
4.0000 mg | Freq: Once | INTRAMUSCULAR | Status: AC
  Administered 2013-12-25: 4 mg via INTRAVENOUS
  Filled 2013-12-25: qty 2

## 2013-12-25 MED ORDER — METOCLOPRAMIDE HCL 5 MG/ML IJ SOLN
10.0000 mg | Freq: Once | INTRAMUSCULAR | Status: AC
  Administered 2013-12-25: 10 mg via INTRAVENOUS
  Filled 2013-12-25: qty 2

## 2013-12-25 MED ORDER — HYDROCODONE-ACETAMINOPHEN 5-325 MG PO TABS: 1.0000 | ORAL_TABLET | Freq: Four times a day (QID) | ORAL | Status: DC | PRN

## 2013-12-25 MED ORDER — DIPHENHYDRAMINE HCL 50 MG/ML IJ SOLN
25.0000 mg | Freq: Once | INTRAMUSCULAR | Status: AC
  Administered 2013-12-25: 25 mg via INTRAVENOUS
  Filled 2013-12-25: qty 1

## 2013-12-25 NOTE — Discharge Instructions (Signed)
Follow up with your md next week if not improving °

## 2013-12-25 NOTE — ED Notes (Signed)
Patient transported to CT 

## 2013-12-25 NOTE — ED Provider Notes (Signed)
CSN: [id number]     Arrival date & time 12/25/13  0901 History   First MD Initiated Contact with Patient 12/25/13 [phone]     Chief Complaint  Patient presents with  . Migraine   (Consider location/radiation/quality/duration/timing/severity/associated sxs/prior Treatment) Patient is a 63 y.o. female presenting with migraines. The history is provided by the patient (pt complains of a headache).  Migraine This is a new problem. The current episode started 3 to 5 hours ago. The problem occurs constantly. The problem has not changed since onset.Associated symptoms include headaches. Pertinent negatives include no chest pain and no abdominal pain. Nothing aggravates the symptoms. Nothing relieves the symptoms. She has tried acetaminophen for the symptoms. The treatment provided moderate relief.    Past Medical History  Diagnosis Date  . Migraine   . Seizures   . High cholesterol    History reviewed. No pertinent past surgical history. Family History  Problem Relation Age of Onset  . Lung cancer Mother    History  Substance Use Topics  . Smoking status: Never Smoker   . Smokeless tobacco: Never Used  . Alcohol Use: No   OB History   Grav Para Term Preterm Abortions TAB SAB Ect Mult Living                 Review of Systems  Constitutional: Negative for appetite change and fatigue.  HENT: Negative for congestion, ear discharge and sinus pressure.   Eyes: Negative for discharge.  Respiratory: Negative for cough.   Cardiovascular: Negative for chest pain.  Gastrointestinal: Negative for abdominal pain and diarrhea.  Genitourinary: Negative for frequency and hematuria.  Musculoskeletal: Negative for back pain.  Skin: Negative for rash.  Neurological: Positive for headaches. Negative for seizures.  Psychiatric/Behavioral: Negative for hallucinations.    Allergies  Aspirin  Home Medications   Current Outpatient Rx  Name  Route  Sig  Dispense  Refill  . acetaminophen (TYLENOL)  500 MG tablet   Oral   Take 1,000 mg by mouth every 6 (six) hours as needed.         Marland Kitchen atorvastatin (LIPITOR) 40 MG tablet   Oral   Take 40 mg by mouth daily.         . calcium-vitamin D (OSCAL WITH D) 250-125 MG-UNIT per tablet   Oral   Take 1 tablet by mouth daily.         . cyclobenzaprine (FLEXERIL) 5 MG tablet   Oral   Take 1 tablet (5 mg total) by mouth 3 (three) times daily as needed.   270 tablet   1   . divalproex (DEPAKOTE ER) 250 MG 24 hr tablet   Oral   Take 1 tablet (250 mg total) by mouth 3 (three) times daily.   270 tablet   1   . esomeprazole (NEXIUM) 40 MG capsule   Oral   Take 40 mg by mouth daily before breakfast.         . IRON PO   Oral   Take 1 tablet by mouth daily.         . magnesium oxide (MAG-OX) 400 MG tablet   Oral   Take 400 mg by mouth 2 (two) times daily.         . metoCLOPramide (REGLAN) 5 MG tablet   Oral   Take 1 tablet (5 mg total) by mouth 3 (three) times daily as needed (nausea or headache).   270 tablet   1   . Multiple  Vitamin (MULTIVITAMIN WITH MINERALS) TABS   Oral   Take 1 tablet by mouth daily.         . nortriptyline (PAMELOR) 25 MG capsule      One po qhs x one week then 2 caps po qhs   180 capsule   3   . olmesartan (BENICAR) 40 MG tablet   Oral   Take 40 mg by mouth daily.         Marland Kitchen tiZANidine (ZANAFLEX) 2 MG tablet   Oral   Take 1 tablet (2 mg total) by mouth 2 (two) times daily.   180 tablet   1   . topiramate (TOPAMAX) 100 MG tablet   Oral   Take 1 tablet (100 mg total) by mouth 2 (two) times daily.   180 tablet   1   . verapamil (CALAN-SR) 240 MG CR tablet   Oral   Take 240 mg by mouth at bedtime.         . vitamin B-12 (CYANOCOBALAMIN) 100 MCG tablet   Oral   Take 50 mcg by mouth daily.         Marland Kitchen zolmitriptan (ZOMIG) 5 MG nasal solution   Nasal   Place 5 sprays into the nose as needed for migraine.   12 Units   3   . HYDROcodone-acetaminophen (NORCO/VICODIN) 5-325  MG per tablet   Oral   Take 1 tablet by mouth every 6 (six) hours as needed for moderate pain.   20 tablet   0    BP 153/74  Pulse 72  Temp(Src) 97.6 F (36.4 C) (Oral)  Resp 16  SpO2 100% Physical Exam  Constitutional: She is oriented to person, place, and time. She appears well-developed.  HENT:  Head: Normocephalic.  Eyes: Conjunctivae and EOM are normal. No scleral icterus.  Neck: Neck supple. No thyromegaly present.  Cardiovascular: Normal rate and regular rhythm.  Exam reveals no gallop and no friction rub.   No murmur heard. Pulmonary/Chest: No stridor. She has no wheezes. She has no rales. She exhibits no tenderness.  Abdominal: She exhibits no distension. There is no tenderness. There is no rebound.  Musculoskeletal: Normal range of motion. She exhibits no edema.  Lymphadenopathy:    She has no cervical adenopathy.  Neurological: She is oriented to person, place, and time. She exhibits normal muscle tone. Coordination normal.  Skin: No rash noted. No erythema.  Psychiatric: She has a normal mood and affect. Her behavior is normal.    ED Course  Procedures (including critical care time) Labs Review Labs Reviewed - No data to display Imaging Review Ct Head Wo Contrast  12/25/2013   CLINICAL DATA:  Migraine.  EXAM: CT HEAD WITHOUT CONTRAST  TECHNIQUE: Contiguous axial images were obtained from the base of the skull through the vertex without intravenous contrast.  COMPARISON:  MRI 09/15/2009  FINDINGS: No acute intracranial abnormality. Specifically, no hemorrhage, hydrocephalus, mass lesion, acute infarction, or significant intracranial injury. No acute calvarial abnormality. Visualized paranasal sinuses and mastoids clear. Orbital soft tissues unremarkable.  IMPRESSION: Negative.   Electronically Signed   By: Rolm Baptise M.D.   On: 12/25/2013 11:11    EKG Interpretation   None       MDM   1. Headache        Maudry Diego, MD 12/25/13 1120

## 2013-12-25 NOTE — ED Notes (Signed)
Pt fell asleep while this RN was slowly giving her the pain medication.

## 2013-12-25 NOTE — ED Notes (Signed)
Pt c/o migraine x 1 day.  Pain score 5/10.  Sts light sensitivity.  Denies blurred vision.  Hx of migraines.  Followed by [name] MD.  Sts "it's different than normal, because I can't get rid of it."

## 2013-12-28 ENCOUNTER — Telehealth: Payer: Self-pay | Admitting: Neurology

## 2013-12-28 NOTE — Telephone Encounter (Signed)
Patient has been having headaches for four days and has been to the ER and urgent care please call.

## 2013-12-28 NOTE — Telephone Encounter (Signed)
Spoke to patient's husband and she has had a headache since Thursday.  She has been to the ED on Friday where they gave her Toradol and then to Urgent care on Sunday and gave her more Toradol and Phenergan.  They would like to know what she can do.  Please advise.

## 2013-12-29 NOTE — Telephone Encounter (Signed)
She has chronic headaches, had a lot of phone calls inbetween the visit.  Markham Jordan, how is her BOTOX status?

## 2013-12-30 NOTE — Telephone Encounter (Signed)
Dana:  Give her a follow up appt with Hoyle Sauer in next available.

## 2013-12-30 NOTE — Telephone Encounter (Signed)
This patient was denied for botox

## 2013-12-31 ENCOUNTER — Ambulatory Visit (INDEPENDENT_AMBULATORY_CARE_PROVIDER_SITE_OTHER): Payer: BC Managed Care – PPO | Admitting: Nurse Practitioner

## 2013-12-31 ENCOUNTER — Telehealth: Payer: Self-pay | Admitting: Nurse Practitioner

## 2013-12-31 ENCOUNTER — Encounter: Payer: Self-pay | Admitting: Nurse Practitioner

## 2013-12-31 VITALS — BP 138/73 | HR 77 | Ht 61.0 in | Wt 142.0 lb

## 2013-12-31 DIAGNOSIS — R51 Headache: Secondary | ICD-10-CM

## 2013-12-31 DIAGNOSIS — G8929 Other chronic pain: Secondary | ICD-10-CM

## 2013-12-31 MED ORDER — PREDNISONE 10 MG PO TABS: 10.0000 mg | ORAL_TABLET | Freq: Every day | ORAL | Status: DC

## 2013-12-31 MED ORDER — RIZATRIPTAN BENZOATE 10 MG PO TBDP: 10.0000 mg | ORAL_TABLET | ORAL | Status: DC | PRN

## 2013-12-31 NOTE — Progress Notes (Signed)
[facility]  PATIENT: [name] DOB: 12/30/1950   REASON FOR VISIT: Followup chronic headache   HISTORY OF PRESENT ILLNESS: [name], 63 year old female returns for followup. She has history of chronic headaches which are daily at this point. Her current headache has been going on for 6 days she was last seen in this office by  Dr. [name] on 10/02/2013. She has been tried on multiple preventive in the past. Her insurance has denied Botox. She also has also a history of complex partial seizure disorder with last seizure occurring in 2007. She is currently taking Depakote Flexeril magnesium nortriptyline Zanaflex Topamax and verapamil as preventives.. She takes Zomig acutely, it works sometimes and others not. Imitrex has failed in the past. She has had 2 ER visits for headache since last seen receiving Toradol both times. Indocin helped her headaches in the past however it was stopped due to elevated creatinine. She returns for reevaluation  HISTORY: right-handed [place] American married female with daily left hemicranial headaches lasting 30 minutes to 1-1/2 hours usually occurring 5 or 6 times per day, previous patient of Dr. Erling Cruz.  Her headaches are always on the left side. She also has a history of complex partial Seizures, last seizure was 2007.  She had an MRI studies of her brain 05/2009, 09/15/09, and 07/02/12 showing stable en plaque meningiomas, one over the left lateral convexity and the other in the middle of the anterior fossa.  There is improvement in the frequency and severity of her headaches with the use of Indomethacin . It cuts down her headaches approximately 75%. Indomethacin is associated with elevated creatinine. She is followed by Dr.Webb. Her creatinine runs 1.19. Because of this she discontinued her indomethacin .  She tried Botox by Dr. Tyron Russell and Addelman without benefit . Nasal oxygen increases the speed with which her headache pain relief occurs.  She developed headaches in February 2012 and went to the emergency room. She went to the emergency room 10/19/213 with headaches. She continued to have headaches after her ER visit. They are worse at night than during the day. They are not associated with eyelid droop, nasal stuffiness, or tearing. She has gained weight on divalproex sodium.  UPDATE April 8th 2014:  She continues to have left side headaches, about five-six times a day, lasting less than one hour. She has stopped working because of headaches, She is alternating between tylenol, tizanidin, flexeril, Reglan, oxygen during headaches.  Imitrex does not work, she worries about its over use too. Overall , she is happy about current treatment  UPDATE Oct 02 2013:  She complains of left side headache, close to her left ear, done to left neck, every day, 5-6 times a day, lasting 45 minutes, She is taking flexeril, tizanidin, reglan for headaches, she is also taking Depakote, Topamax for headache prevention.   REVIEW OF SYSTEMS: Full 14 system review of systems performed and notable only for those listed, all others are neg:  Constitutional: N/A  Cardiovascular: N/A  Ear/Nose/Throat: Neck pain Skin: N/A  Eyes: N/A  Respiratory: N/A  Gastroitestinal: N/A  Hematology/Lymphatic: N/A  Endocrine: N/A Musculoskeletal: Joint pain Allergy/Immunology: N/A  Neurological: Headache, frequent  awakening during the night with headache Psychiatric: N/A   ALLERGIES: Allergies  Allergen Reactions  . Aspirin Other (See Comments)    Could cause ulcers    HOME MEDICATIONS: Outpatient Prescriptions Prior to Visit  Medication Sig Dispense Refill  . acetaminophen (TYLENOL) 500 MG tablet Take 1,000 mg by  mouth every 6 (six) hours as needed.      Marland Kitchen atorvastatin (LIPITOR) 40 MG tablet Take 40 mg by mouth daily.      . calcium-vitamin D (OSCAL WITH D) 250-125 MG-UNIT per tablet Take 1 tablet by mouth daily.      . cyclobenzaprine (FLEXERIL) 5 MG  tablet Take 1 tablet (5 mg total) by mouth 3 (three) times daily as needed.  270 tablet  1  . divalproex (DEPAKOTE ER) 250 MG 24 hr tablet Take 1 tablet (250 mg total) by mouth 3 (three) times daily.  270 tablet  1  . esomeprazole (NEXIUM) 40 MG capsule Take 40 mg by mouth daily before breakfast.      . HYDROcodone-acetaminophen (NORCO/VICODIN) 5-325 MG per tablet Take 1 tablet by mouth every 6 (six) hours as needed for moderate pain.  20 tablet  0  . IRON PO Take 1 tablet by mouth daily.      . magnesium oxide (MAG-OX) 400 MG tablet Take 400 mg by mouth 2 (two) times daily.      . metoCLOPramide (REGLAN) 5 MG tablet Take 1 tablet (5 mg total) by mouth 3 (three) times daily as needed (nausea or headache).  270 tablet  1  . Multiple Vitamin (MULTIVITAMIN WITH MINERALS) TABS Take 1 tablet by mouth daily.      . nortriptyline (PAMELOR) 25 MG capsule One po qhs x one week then 2 caps po qhs  180 capsule  3  . olmesartan (BENICAR) 40 MG tablet Take 40 mg by mouth daily.      Marland Kitchen tiZANidine (ZANAFLEX) 2 MG tablet Take 1 tablet (2 mg total) by mouth 2 (two) times daily.  180 tablet  1  . topiramate (TOPAMAX) 100 MG tablet Take 1 tablet (100 mg total) by mouth 2 (two) times daily.  180 tablet  1  . verapamil (CALAN-SR) 240 MG CR tablet Take 240 mg by mouth at bedtime.      . vitamin B-12 (CYANOCOBALAMIN) 100 MCG tablet Take 50 mcg by mouth daily.      Marland Kitchen zolmitriptan (ZOMIG) 5 MG nasal solution Place 5 sprays into the nose as needed for migraine.  12 Units  3   No facility-administered medications prior to visit.    PAST MEDICAL HISTORY: Past Medical History  Diagnosis Date  . Migraine   . Seizures   . High cholesterol     PAST SURGICAL HISTORY: History reviewed. No pertinent past surgical history.  FAMILY HISTORY: Family History  Problem Relation Age of Onset  . Lung cancer Mother     SOCIAL HISTORY: History   Social History  . Marital Status: Married    Spouse Name: Elenore Rota    Number  of Children: 3  . Years of Education: college   Occupational History  .      does not work   Social History Main Topics  . Smoking status: Never Smoker   . Smokeless tobacco: Never Used  . Alcohol Use: No  . Drug Use: No  . Sexual Activity: Not on file   Other Topics Concern  . Not on file   Social History Narrative   Patient lives at home with her husband Elenore Rota). Patient does not work.   Right handed.   College education.   Caffeine- One cup of coffee daily.   Patient has three children.     PHYSICAL EXAM  Filed Vitals:   12/31/13 0957  BP: 138/73  Pulse: 77  Height: 5'  1" (1.549 m)  Weight: 142 lb (64.411 kg)   Body mass index is 26.84 kg/(m^2).  Generalized: Well developed, in no acute distress  Head: normocephalic and atraumatic,. Oropharynx benign  Neck: Supple, no carotid bruits  Cardiac: Regular rate rhythm, no murmur  Musculoskeletal: No deformity   Neurological examination   Mentation: Alert oriented to time, place, history taking. Follows all commands speech and language fluent  Cranial nerve II-XII: Pupils were equal round reactive to light extraocular movements were full, visual field were full on confrontational test. Facial sensation and strength were normal. hearing was intact to finger rubbing bilaterally. Uvula tongue midline. head turning and shoulder shrug were normal and symmetric.Tongue protrusion into cheek strength was normal. Motor: normal bulk and tone, full strength in the BUE, BLE, fine finger movements normal, no pronator drift. No focal weakness Coordination: finger-nose-finger, heel-to-shin bilaterally, no dysmetria Reflexes: Brachioradialis 2/2, biceps 2/2, triceps 2/2, patellar 2/2, Achilles 2/2, plantar responses were flexor bilaterally. Gait and Station: Rising up from seated position without assistance, normal stance,  moderate stride, good arm swing, smooth turning, able to perform tiptoe, and heel walking without difficulty.  Tandem gait is steady  DIAGNOSTIC DATA (LABS, IMAGING, TESTING) - None to review  ASSESSMENT AND PLAN  63 y.o. year old female  has a past medical history of Migraine; Seizures; and High cholesterol. here to followup. Last seizure occurred in 2007. Patient has had a daily headache for 6 days and to the ER visits in the last 2 months  Prednisone 10 day dose pack Try maxalt 10mg  MLT as acute med Continue all the preventives (7) Botox was denied Zonegran has not been used Given information and list on foods and other factors to avoid F/U 2 months Keep headache diary Dennie Bible, [facility], [facility], APRN  [facility] [address] [place], [place] [address] [phone]

## 2013-12-31 NOTE — Telephone Encounter (Signed)
I called the pharmacy.  Spoke with Judson Roch.  She said they were confused on the directions for the Rx so they dispensed #56 10mg  tabs and put one daily with breakfast as the directions.  She said they did not have 10mg  packs available and they did not know what the tapering instructions needed to be.  She said the patient already picked up the medication they filled.

## 2013-12-31 NOTE — Patient Instructions (Signed)
Prednisone 10 day dose pack Try maxalt 10mg  melt as acute med Continue all the preventives (7) Botox was denied Given information and list on foods and other factors to avoid F/U 2 months

## 2013-12-31 NOTE — Telephone Encounter (Signed)
Calling to find out the instructions for his wife taking the prednisone

## 2013-12-31 NOTE — Telephone Encounter (Signed)
Jessica,I wanted 10 day is that no longer available. Can you call the pharmacy and let me know I will call the pt

## 2013-12-31 NOTE — Telephone Encounter (Signed)
Mr. Mingo Amber called in regarding his wife's Prednisone rx.  Was told by doctor that she needed a 10 day dose pack, but pharmacy only has 6 or 12 day dose packs.  Mr. Mingo Amber isn't sure what he should do.  Please call him as well as the pharmacy if necessary.  Thank you.

## 2013-12-31 NOTE — Telephone Encounter (Signed)
Called and spoke to husband. Made him aware how to take the dose pack since [facility] did not put directions on the script.

## 2014-01-08 ENCOUNTER — Encounter: Payer: Self-pay | Admitting: Neurology

## 2014-01-08 NOTE — Telephone Encounter (Signed)
Patient saw Hoyle Sauer on 12-31-13.

## 2014-01-08 NOTE — Telephone Encounter (Signed)
This encounter was created in error - please disregard.

## 2014-02-23 ENCOUNTER — Telehealth: Payer: Self-pay | Admitting: Neurology

## 2014-02-23 NOTE — Telephone Encounter (Signed)
Colletta Maryland from [facility] calling to state that patient will be coming to them tomorrow and she is going to need a script for oxygen since she will be traveling. Please fax the order to [phone].

## 2014-02-24 ENCOUNTER — Other Ambulatory Visit: Payer: Self-pay | Admitting: *Deleted

## 2014-02-24 DIAGNOSIS — R51 Headache: Secondary | ICD-10-CM

## 2014-02-24 NOTE — Telephone Encounter (Signed)
Patient's order for oxygen was put in and faxed over to [facility], Attn: Colletta [place], also got conformation.

## 2014-03-24 ENCOUNTER — Telehealth: Payer: Self-pay | Admitting: Neurology

## 2014-03-24 MED ORDER — PREDNISONE 10 MG PO TABS: ORAL_TABLET | ORAL | Status: DC

## 2014-03-24 NOTE — Telephone Encounter (Signed)
Patient states she has had a migraine since early this morning and would like to request Prednisone script since it has helped her in the past. Patient states that the Maxalt is not helping her. Please call and advise patient.  ° °

## 2014-03-24 NOTE — Telephone Encounter (Signed)
Patient states she has had a migraine since early this morning and would like to request Prednisone script since it has helped her in the past. Patient states that the Maxalt is not helping her. Please call and advise patient.

## 2014-03-31 ENCOUNTER — Other Ambulatory Visit (HOSPITAL_COMMUNITY): Payer: Self-pay | Admitting: Family Medicine

## 2014-03-31 ENCOUNTER — Other Ambulatory Visit (HOSPITAL_COMMUNITY): Payer: Self-pay | Admitting: *Deleted

## 2014-03-31 DIAGNOSIS — Z1231 Encounter for screening mammogram for malignant neoplasm of breast: Secondary | ICD-10-CM

## 2014-04-02 ENCOUNTER — Encounter (INDEPENDENT_AMBULATORY_CARE_PROVIDER_SITE_OTHER): Payer: Self-pay

## 2014-04-02 ENCOUNTER — Ambulatory Visit (INDEPENDENT_AMBULATORY_CARE_PROVIDER_SITE_OTHER): Payer: BC Managed Care – PPO | Admitting: Nurse Practitioner

## 2014-04-02 ENCOUNTER — Encounter: Payer: Self-pay | Admitting: Nurse Practitioner

## 2014-04-02 VITALS — BP 130/72 | HR 83 | Ht 61.0 in | Wt 136.0 lb

## 2014-04-02 DIAGNOSIS — G8929 Other chronic pain: Secondary | ICD-10-CM

## 2014-04-02 DIAGNOSIS — R51 Headache: Secondary | ICD-10-CM

## 2014-04-02 NOTE — Patient Instructions (Addendum)
continue all of your preventives as previously directed Use your oxygen as previously directed by Dr. Erling Cruz F/U in 3 months

## 2014-04-02 NOTE — Progress Notes (Signed)
[facility]  PATIENT: [name] DOB: 09/08/1951   REASON FOR VISIT: Followup for headache   HISTORY OF PRESENT ILLNESS: [name], 63 year old female returns for followup. She called in the office on 03/24/2014 and was given a prednisone dosepak by [name]. Patient says she was told to stop her preventives while she was on the dosepak. She has not gotten relief from the dosepak. I made her aware that she was not supposed to stop the preventives she takes the dose  pack  in addition. She brings in her headache diary, she has 2 severe headaches in February, 7 severe headaches in March, that is when she called in for prednisone which has been helpful in the past. She claimed her headache was a 10 when I first walked into the room today but after she was set up to receive IV infusion suddenly her headache went away. She is on multiple preventives to include Depakote, Flexeril, magnesium, nortriptyline Zanaflex, Topamax, and verapamil She takes Zomig acutely, she also has oxygen to take for her headaches which she did not try this morning before coming to the office. She returns for reevaluation  HISTORY:  of chronic headaches which are daily at this point. Her current headache has been going on for 6 days she was last seen in this office by [name] on 10/02/2013. She has been tried on multiple preventive in the past. Her insurance has denied Botox. She also has also a history of complex partial seizure disorder with last seizure occurring in 2007. She is currently taking Depakote Flexeril magnesium nortriptyline Zanaflex Topamax and verapamil as preventives.. She takes Zomig acutely, it works sometimes and others not. Imitrex has failed in the past. She has had 2 ER visits for headache since last seen receiving Toradol both times. Indocin helped her headaches in the past however it was stopped due to elevated creatinine. She returns for reevaluation  HISTORY: right-handed Michelle  Curtis married female with daily left hemicranial headaches lasting 30 minutes to 1-1/2 hours usually occurring 5 or 6 times per day, previous patient of Dr. Erling Curtis.  Her headaches are always on the left side. She also has a history of complex partial Seizures, last seizure was 2007.  She had an MRI studies of her brain 05/2009, 09/15/09, and 07/02/12 showing stable en plaque meningiomas, one over the left lateral convexity and the other in the middle of the anterior fossa.  There is improvement in the frequency and severity of her headaches with the use of Indomethacin . It cuts down her headaches approximately 75%. Indomethacin is associated with elevated creatinine. She is followed by [facility]. Her creatinine runs 1.19. Because of this she discontinued her indomethacin .  She tried Botox by Dr. Tyron Curtis and [name] without benefit . Nasal oxygen increases the speed with which her headache pain relief occurs. She developed headaches in February 2012 and went to the emergency room. She went to the emergency room 10/19/213 with headaches. She continued to have headaches after her ER visit. They are worse at night than during the day. They are not associated with eyelid droop, nasal stuffiness, or tearing. She has gained weight on divalproex sodium.  UPDATE April 8th 2014:  She continues to have left side headaches, about five-six times a day, lasting less than one hour. She has stopped working because of headaches, She is alternating between tylenol, tizanidin, flexeril, Reglan, oxygen during headaches.  Imitrex does not work, she worries about its over use too.  Overall , she is happy about current treatment  UPDATE Oct 02 2013:  She complains of left side headache, close to her left ear, done to left neck, every day, 5-6 times a day, lasting 45 minutes, She is taking flexeril, tizanidin, reglan for headaches, she is also taking Depakote, Topamax for headache prevention.    REVIEW OF SYSTEMS: Full 14 system  review of systems performed and notable only for those listed, all others are neg:  Constitutional: N/A  Cardiovascular: N/A  Ear/Nose/Throat: N/A  Skin: N/A  Eyes: N/A  Respiratory: N/A  Gastroitestinal: N/A  Hematology/Lymphatic: N/A  Endocrine: N/A Musculoskeletal:N/A  Allergy/Immunology: N/A  Neurological: Headache Psychiatric: N/A   ALLERGIES: Allergies  Allergen Reactions  . Aspirin Other (See Comments)    Could cause ulcers    HOME MEDICATIONS: Outpatient Prescriptions Prior to Visit  Medication Sig Dispense Refill  . acetaminophen (TYLENOL) 500 MG tablet Take 1,000 mg by mouth every 6 (six) hours as needed.      Marland Kitchen atorvastatin (LIPITOR) 40 MG tablet Take 40 mg by mouth daily.      . calcium-vitamin D (OSCAL WITH D) 250-125 MG-UNIT per tablet Take 1 tablet by mouth daily.      . cyclobenzaprine (FLEXERIL) 5 MG tablet Take 1 tablet (5 mg total) by mouth 3 (three) times daily as needed.  270 tablet  1  . divalproex (DEPAKOTE ER) 250 MG 24 hr tablet Take 1 tablet (250 mg total) by mouth 3 (three) times daily.  270 tablet  1  . esomeprazole (NEXIUM) 40 MG capsule Take 40 mg by mouth daily before breakfast.      . IRON PO Take 1 tablet by mouth daily.      . magnesium oxide (MAG-OX) 400 MG tablet Take 400 mg by mouth 2 (two) times daily.      . metoCLOPramide (REGLAN) 5 MG tablet Take 1 tablet (5 mg total) by mouth 3 (three) times daily as needed (nausea or headache).  270 tablet  1  . Multiple Vitamin (MULTIVITAMIN WITH MINERALS) TABS Take 1 tablet by mouth daily.      . nortriptyline (PAMELOR) 25 MG capsule One po qhs x one week then 2 caps po qhs  180 capsule  3  . olmesartan (BENICAR) 40 MG tablet Take 40 mg by mouth daily.      . predniSONE (DELTASONE) 10 MG tablet Take as directed for 10 days  (Instructions same as 10 day dose pack)  56 tablet  0  . rizatriptan (MAXALT-MLT) 10 MG disintegrating tablet Take 1 tablet (10 mg total) by mouth as needed for migraine. May  repeat in 2 hours if needed  10 tablet  2  . tiZANidine (ZANAFLEX) 2 MG tablet Take 1 tablet (2 mg total) by mouth 2 (two) times daily.  180 tablet  1  . topiramate (TOPAMAX) 100 MG tablet Take 1 tablet (100 mg total) by mouth 2 (two) times daily.  180 tablet  1  . verapamil (CALAN-SR) 240 MG CR tablet Take 240 mg by mouth at bedtime.      . vitamin B-12 (CYANOCOBALAMIN) 100 MCG tablet Take 50 mcg by mouth daily.      Marland Kitchen zolmitriptan (ZOMIG) 5 MG nasal solution Place 5 sprays into the nose as needed for migraine.  12 Units  3  . HYDROcodone-acetaminophen (NORCO/VICODIN) 5-325 MG per tablet Take 1 tablet by mouth every 6 (six) hours as needed for moderate pain.  20 tablet  0   No  facility-administered medications prior to visit.    PAST MEDICAL HISTORY: Past Medical History  Diagnosis Date  . Migraine   . Seizures   . High cholesterol     PAST SURGICAL HISTORY: History reviewed. No pertinent past surgical history.  FAMILY HISTORY: Family History  Problem Relation Age of Onset  . Lung cancer Mother     SOCIAL HISTORY: History   Social History  . Marital Status: Married    Spouse Name: Elenore Rota    Number of Children: 3  . Years of Education: college   Occupational History  .      does not work   Social History Main Topics  . Smoking status: Never Smoker   . Smokeless tobacco: Never Used  . Alcohol Use: No  . Drug Use: No  . Sexual Activity: Not on file   Other Topics Concern  . Not on file   Social History Narrative   Patient lives at home with her husband Elenore Rota). Patient does not work.   Right handed.   College education.   Caffeine- One cup of coffee daily.   Patient has three children.     PHYSICAL EXAM  Filed Vitals:   04/02/14 1021  BP: 130/72  Pulse: 83  Height: 5\' 1"  (1.549 m)  Weight: 136 lb (61.689 kg)   Body mass index is 25.71 kg/(m^2).  Generalized: Well developed, in no acute distress  Head: normocephalic and atraumatic,. Oropharynx  benign  Neck: Supple, no carotid bruits  Cardiac: Regular rate rhythm, no murmur  Musculoskeletal: No deformity   Neurological examination   Mentation: Alert oriented to time, place, history taking. Follows all commands speech and language fluent  Cranial nerve II-XII: Pupils were equal round reactive to light extraocular movements were full, visual field were full on confrontational test. Facial sensation and strength were normal. hearing was intact to finger rubbing bilaterally. Uvula tongue midline. head turning and shoulder shrug were normal and symmetric.Tongue protrusion into cheek strength was normal. Motor: normal bulk and tone, full strength in the BUE, BLE, fine finger movements normal, no pronator drift. No focal weakness Sensory: normal and symmetric to light touch, pinprick, and  vibration  Coordination: finger-nose-finger, heel-to-shin bilaterally, no dysmetria Reflexes: Brachioradialis 2/2, biceps 2/2, triceps 2/2, patellar 2/2, Achilles 2/2, plantar responses were flexor bilaterally. Gait and Station: Rising up from seated position without assistance, normal stance,  moderate stride, good arm swing, smooth turning, able to perform tiptoe, and heel walking without difficulty. Tandem gait is steady  DIAGNOSTIC DATA (LABS, IMAGING, TESTING) - ASSESSMENT AND PLAN  63 y.o. year old female  has a past medical history of Migraine; left hemicranial headaches with symptoms of eyelid droop nasal stuffiness  and eye tearing , and Seizures; she was given a prednisone dosepak by [name] on 03/24/14 and stopped preventives at that time for whatever reason.  Continue all of your preventives as previously directed, she has not been on Zonegran and Botox has been denied. She is currently on 7 preventives. Due to this I had a buccal swab  performed to see if she's metabolizing her medications. Use your oxygen as previously directed  F/U in 3 months, next visit with  Dr. Luan Pulling,  [facility], [facility], [facility] [address] [place],  [address] [phone]

## 2014-04-19 ENCOUNTER — Telehealth: Payer: Self-pay | Admitting: *Deleted

## 2014-04-19 NOTE — Telephone Encounter (Signed)
Patient calling to get results from buccal swab performed at office visit on 4/10.  Please call patients with results. thanks

## 2014-04-19 NOTE — Telephone Encounter (Signed)
Pt is calling requesting results from the buccal swab test that was performed on 4/10. Pt was last seen by Hoyle Sauer, NP, Hoyle Sauer, NP is out of the office, sending information to the pt's doctor, Dr. [name]. Please advise

## 2014-04-22 NOTE — Telephone Encounter (Signed)
Called pt to inform her per Hoyle Sauer, NP that the Buccal swab test results showed that the pt's medication is metabolizing in her body and that everything looks good. I advised the pt that if she has any other problems, questions or concerns to call the office. Pt verbalized understanding.

## 2014-04-22 NOTE — Telephone Encounter (Signed)
Patient called to state she still hasn't heard back about results, please call patient and advise.

## 2014-04-24 ENCOUNTER — Other Ambulatory Visit: Payer: Self-pay | Admitting: Neurology

## 2014-05-06 ENCOUNTER — Ambulatory Visit (HOSPITAL_COMMUNITY)
Admission: RE | Admit: 2014-05-06 | Discharge: 2014-05-06 | Disposition: A | Discharge status: Home / Self Care | Payer: BC Managed Care – PPO | Source: Ambulatory Visit | Attending: Family Medicine | Admitting: Family Medicine

## 2014-05-06 DIAGNOSIS — Z1231 Encounter for screening mammogram for malignant neoplasm of breast: Secondary | ICD-10-CM | POA: Insufficient documentation

## 2014-05-18 ENCOUNTER — Ambulatory Visit: Payer: BC Managed Care – PPO

## 2014-05-25 ENCOUNTER — Ambulatory Visit: Payer: BC Managed Care – PPO

## 2014-06-01 ENCOUNTER — Encounter: Payer: BC Managed Care – PPO | Attending: Family Medicine

## 2014-06-01 ENCOUNTER — Ambulatory Visit: Payer: BC Managed Care – PPO

## 2014-06-01 VITALS — Ht 60.0 in | Wt 142.2 lb

## 2014-06-01 DIAGNOSIS — E119 Type 2 diabetes mellitus without complications: Secondary | ICD-10-CM | POA: Insufficient documentation

## 2014-06-01 DIAGNOSIS — Z713 Dietary counseling and surveillance: Secondary | ICD-10-CM | POA: Insufficient documentation

## 2014-06-02 NOTE — Progress Notes (Signed)
Patient was seen on 06/01/14 for the first of a series of three diabetes self-management courses at the [facility].  Current HbA1c: 7.4% on 05/04/14  The following learning objectives were met by the patient during this class:  Describe diabetes  State some common risk factors for diabetes  Defines the role of glucose and insulin  Identifies type of diabetes and pathophysiology  Describe the relationship between diabetes and cardiovascular risk  State the members of the Healthcare Team  States the rationale for glucose monitoring  State when to test glucose  State their individual Target Range  State the importance of logging glucose readings  Describe how to interpret glucose readings  Identifies A1C target  Explain the correlation between A1c and eAG values  State symptoms and treatment of high blood glucose  State symptoms and treatment of low blood glucose  Explain proper technique for glucose testing  Identifies proper sharps disposal  Handouts given during class include:  Living Well with Diabetes book  Carb Counting and Meal Planning book  Meal Plan Card  Carbohydrate guide  Meal planning worksheet  Low Sodium Flavoring Tips  The diabetes portion plate  F2X to eAG Conversion Chart  Diabetes Medications  Diabetes Recommended Care Schedule  Support Group  Diabetes Success Plan  Core Class Satisfaction Survey  Follow-Up Plan:  Attend core 2

## 2014-06-02 NOTE — Progress Notes (Deleted)
Patient was seen on 05/29/14 for the complete diabetes self-management series at the [facility]. This is a part of the [facility] [employer].  Current A1c = 7.4% on 05/04/14  Handouts given during class include:  Living Well with Diabetes book  Carb Counting and Meal Planning book  Meal Plan Card  Carbohydrate guide  Meal planning worksheet  Low Sodium Flavoring Tips  The diabetes portion plate  Low Carbohydrate Snack Suggestions  A1c to eAG Conversion Chart  Diabetes Medications  Stress Management  Diabetes Recommended Care Schedule  Diabetes Success Plan  Core Class Satisfaction Survey  The following learning objectives were met by the patient during this course:  Describe diabetes  State some common risk factors for diabetes  Defines the role of glucose and insulin  Identifies type of diabetes and pathophysiology  Describe the relationship between diabetes and cardiovascular risk  State the members of the Healthcare Team  States the rationale for glucose monitoring  State when to test glucose  State their individual Target Range  State the importance of logging glucose readings  Describe how to interpret glucose readings  Identifies A1C target  Explain the correlation between A1c and eAG values  State symptoms and treatment of high blood glucose  State symptoms and treatment of low blood glucose  Explain proper technique for glucose testing  Identifies proper sharps disposal  Describe the role of different macronutrients on glucose  Explain how carbohydrates affect blood glucose  State what foods contain the most carbohydrates  Demonstrate carbohydrate counting  Demonstrate how to read Nutrition Facts food label  Describe effects of various fats on heart health  Describe the importance of good nutrition for health and healthy eating strategies  Describe techniques for managing your shopping, cooking  and meal planning  List strategies to follow meal plan when dining out  Describe the effects of alcohol on glucose and how to use it safely   State the amount of activity recommended for healthy living   Describe activities suitable for individual needs   Identify ways to regularly incorporate activity into daily life   Identify barriers to activity and ways to over come these barriers  Identify diabetes medications being personally used and their primary action for lowering glucose and possible side effects   Describe role of stress on blood glucose and develop strategies to address psychosocial issues   Identify diabetes complications and ways to prevent them  Explain how to manage diabetes during illness   Evaluate success in meeting personal goal   Establish 2-3 goals that they will plan to diligently work on until they return for the  58-monthfollow-up visit  Goals:  Follow Diabetes Meal Plan as instructed  Eat 3 meals and 2 snacks, every 3-5 hrs  Limit carbohydrate intake to 45 grams carbohydrate/meal Limit carbohydrate intake to 15 grams carbohydrate/snack Add lean protein foods to meals/snacks  Monitor glucose levels as instructed by your doctor  Aim for 15-30 mins of physical activity daily as tolerated  Bring food record and glucose log to all healthcare visits   Plan: F/U with [facility]

## 2014-06-07 ENCOUNTER — Encounter (HOSPITAL_COMMUNITY): Payer: Self-pay | Admitting: Emergency Medicine

## 2014-06-07 ENCOUNTER — Emergency Department (HOSPITAL_COMMUNITY)
Admission: EM | Admit: 2014-06-07 | Discharge: 2014-06-08 | Disposition: A | Discharge status: Home / Self Care | Payer: BC Managed Care – PPO | Attending: Emergency Medicine | Admitting: Emergency Medicine

## 2014-06-07 DIAGNOSIS — E119 Type 2 diabetes mellitus without complications: Secondary | ICD-10-CM | POA: Insufficient documentation

## 2014-06-07 DIAGNOSIS — E7800 Pure hypercholesterolemia, unspecified: Secondary | ICD-10-CM | POA: Diagnosis not present

## 2014-06-07 DIAGNOSIS — Z79899 Other long term (current) drug therapy: Secondary | ICD-10-CM | POA: Insufficient documentation

## 2014-06-07 DIAGNOSIS — G40909 Epilepsy, unspecified, not intractable, without status epilepticus: Secondary | ICD-10-CM | POA: Insufficient documentation

## 2014-06-07 DIAGNOSIS — G43909 Migraine, unspecified, not intractable, without status migrainosus: Secondary | ICD-10-CM | POA: Diagnosis not present

## 2014-06-07 NOTE — ED Notes (Signed)
Pt arrived to the ED with a complaint of a migraine.  Pt has had a migraine since 1630 hrs.  Pt states she has a hx of migraines.  Pt at present is extremely photo sensitve.

## 2014-06-08 ENCOUNTER — Encounter (HOSPITAL_COMMUNITY): Payer: Self-pay | Admitting: Emergency Medicine

## 2014-06-08 DIAGNOSIS — G43909 Migraine, unspecified, not intractable, without status migrainosus: Secondary | ICD-10-CM | POA: Diagnosis not present

## 2014-06-08 DIAGNOSIS — E119 Type 2 diabetes mellitus without complications: Secondary | ICD-10-CM

## 2014-06-08 MED ORDER — DIPHENHYDRAMINE HCL 50 MG/ML IJ SOLN
25.0000 mg | Freq: Once | INTRAMUSCULAR | Status: AC
  Administered 2014-06-08: 25 mg via INTRAVENOUS
  Filled 2014-06-08: qty 1

## 2014-06-08 MED ORDER — METOCLOPRAMIDE HCL 5 MG/ML IJ SOLN
10.0000 mg | Freq: Once | INTRAMUSCULAR | Status: AC
  Administered 2014-06-08: 10 mg via INTRAVENOUS
  Filled 2014-06-08: qty 2

## 2014-06-08 MED ORDER — KETOROLAC TROMETHAMINE 30 MG/ML IJ SOLN
30.0000 mg | Freq: Once | INTRAMUSCULAR | Status: AC
  Administered 2014-06-08: 30 mg via INTRAVENOUS
  Filled 2014-06-08: qty 1

## 2014-06-08 MED ORDER — SODIUM CHLORIDE 0.9 % IV BOLUS (SEPSIS)
500.0000 mL | Freq: Once | INTRAVENOUS | Status: AC
  Administered 2014-06-08: 500 mL via INTRAVENOUS

## 2014-06-08 NOTE — Progress Notes (Signed)

## 2014-06-08 NOTE — ED Provider Notes (Signed)
CSN: [id number]     Arrival date & time 06/07/14  2216 History   First MD Initiated Contact with Patient 06/08/14 0002     Chief Complaint  Patient presents with  . Migraine     (Consider location/radiation/quality/duration/timing/severity/associated sxs/prior Treatment) Patient is a 63 y.o. female presenting with migraines. The history is provided by the patient.  Migraine This is a recurrent problem. The current episode started 6 to 12 hours ago. The problem occurs constantly. The problem has not changed since onset.Pertinent negatives include no chest pain, no abdominal pain and no shortness of breath. Nothing aggravates the symptoms. Nothing relieves the symptoms. Treatments tried: depakote and topamax. The treatment provided no relief.  Typical migraine.  Always dull always on left side. No f/c/r.  No rashes no tick or travel exposure.    Past Medical History  Diagnosis Date  . Migraine   . Seizures   . High cholesterol   . Diabetes mellitus without complication    Past Surgical History  Procedure Laterality Date  . Abdominal hysterectomy     Family History  Problem Relation Age of Onset  . Lung cancer Mother   . Hypertension Other    History  Substance Use Topics  . Smoking status: Never Smoker   . Smokeless tobacco: Never Used  . Alcohol Use: No   OB History   Grav Para Term Preterm Abortions TAB SAB Ect Mult Living                 Review of Systems  Constitutional: Negative for fever.  Respiratory: Negative for shortness of breath.   Cardiovascular: Negative for chest pain.  Gastrointestinal: Negative for vomiting and abdominal pain.  Musculoskeletal: Negative for neck pain and neck stiffness.  Skin: Negative for rash.  Neurological: Negative for dizziness, tremors, seizures, syncope, facial asymmetry, speech difficulty, weakness, light-headedness and numbness.  All other systems reviewed and are negative.     Allergies  Aspirin  Home Medications    Prior to Admission medications   Medication Sig Start Date End Date Taking? Authorizing Provider  cyclobenzaprine (FLEXERIL) 5 MG tablet Take 5 mg by mouth 3 (three) times daily as needed for muscle spasms.   Yes Historical Provider, MD  divalproex (DEPAKOTE ER) 250 MG 24 hr tablet Take 250 mg by mouth 3 (three) times daily.   Yes Historical Provider, MD  ferrous sulfate 325 (65 FE) MG tablet Take 325 mg by mouth daily with breakfast.   Yes Historical Provider, MD  nortriptyline (PAMELOR) 25 MG capsule Take 50 mg by mouth at bedtime.   Yes Historical Provider, MD  topiramate (TOPAMAX) 100 MG tablet Take 100 mg by mouth 2 (two) times daily.   Yes Historical Provider, MD  acetaminophen (TYLENOL) 500 MG tablet Take 1,000 mg by mouth every 6 (six) hours as needed for mild pain or headache.     Historical Provider, MD  atorvastatin (LIPITOR) 40 MG tablet Take 40 mg by mouth daily.    Historical Provider, MD  calcium-vitamin D (OSCAL WITH D) 250-125 MG-UNIT per tablet Take 1 tablet by mouth daily.    Historical Provider, MD  esomeprazole (NEXIUM) 40 MG capsule Take 40 mg by mouth daily before breakfast.    Historical Provider, MD  magnesium oxide (MAG-OX) 400 MG tablet Take 400 mg by mouth 2 (two) times daily.    Historical Provider, MD  metoCLOPramide (REGLAN) 5 MG tablet Take 5 mg by mouth 3 (three) times daily as needed for nausea (headache).  07/15/13   Marcial Pacas, MD  Multiple Vitamin (MULTIVITAMIN WITH MINERALS) TABS Take 1 tablet by mouth daily.    Historical Provider, MD  olmesartan (BENICAR) 40 MG tablet Take 40 mg by mouth daily.    Historical Provider, MD  rizatriptan (MAXALT-MLT) 10 MG disintegrating tablet Take 1 tablet (10 mg total) by mouth as needed for migraine. May repeat in 2 hours if needed 12/31/13   Dennie Bible, NP  tiZANidine (ZANAFLEX) 2 MG tablet Take 1 tablet (2 mg total) by mouth 2 (two) times daily. 07/15/13   Marcial Pacas, MD  verapamil (CALAN-SR) 240 MG CR tablet Take 240  mg by mouth at bedtime.    Historical Provider, MD  vitamin B-12 (CYANOCOBALAMIN) 100 MCG tablet Take 50 mcg by mouth daily.    Historical Provider, MD  zolmitriptan (ZOMIG) 5 MG nasal solution Place 5 sprays into the nose as needed for migraine. 06/18/13   Marcial Pacas, MD   BP 171/75  Pulse 83  Temp(Src) 98 F (36.7 C) (Oral)  Resp 18  SpO2 100% Physical Exam  Constitutional: She is oriented to person, place, and time. She appears well-developed and well-nourished. No distress.  HENT:  Head: Normocephalic and atraumatic.  Mouth/Throat: Oropharynx is clear and moist.  Eyes: Conjunctivae and EOM are normal. Pupils are equal, round, and reactive to light.  Neck: Normal range of motion. Neck supple.  No meningismus, cognition and phonation are intact  Cardiovascular: Normal rate, regular rhythm and intact distal pulses.   Pulmonary/Chest: Effort normal and breath sounds normal. She has no wheezes. She has no rales.  Abdominal: Soft. Bowel sounds are normal. There is no tenderness. There is no rebound and no guarding.  Musculoskeletal: Normal range of motion. She exhibits no edema and no tenderness.  Lymphadenopathy:    She has no cervical adenopathy.  Neurological: She is alert and oriented to person, place, and time. She has normal reflexes. No cranial nerve deficit. She exhibits normal muscle tone. Coordination normal.  Gait is normal  Skin: Skin is warm and dry.  Psychiatric: She has a normal mood and affect.    ED Course  Procedures (including critical care time) Labs Review Labs Reviewed - No data to display  Imaging Review No results found.   EKG Interpretation None      MDM   Final diagnoses:  None   Patient states this is a typical migraine.  No signs of meningitis.  No indication for CT or LP and patient does not feel she needs one at this time and is markedly improved post medication.  Will d/c.  Return for fevers, neck pain, weakness, numbness, changes in  cognition or any concerns   April K Palumbo-Rasch, MD 06/08/14 0131

## 2014-06-08 NOTE — Discharge Instructions (Signed)
Migraine Headache A migraine headache is an intense, throbbing pain on one or both sides of your head. A migraine can last for 30 minutes to several hours. CAUSES  The exact cause of a migraine headache is not always known. However, a migraine may be caused when nerves in the brain become irritated and release chemicals that cause inflammation. This causes pain. Certain things may also trigger migraines, such as:  Alcohol.  Smoking.  Stress.  Menstruation.  Aged cheeses.  Foods or drinks that contain nitrates, glutamate, aspartame, or tyramine.  Lack of sleep.  Chocolate.  Caffeine.  Hunger.  Physical exertion.  Fatigue.  Medicines used to treat chest pain (nitroglycerine), birth control pills, estrogen, and some blood pressure medicines. SIGNS AND SYMPTOMS  Pain on one or both sides of your head.  Pulsating or throbbing pain.  Severe pain that prevents daily activities.  Pain that is aggravated by any physical activity.  Nausea, vomiting, or both.  Dizziness.  Pain with exposure to bright lights, loud noises, or activity.  General sensitivity to bright lights, loud noises, or smells. Before you get a migraine, you may get warning signs that a migraine is coming (aura). An aura may include:  Seeing flashing lights.  Seeing bright spots, halos, or zig-zag lines.  Having tunnel vision or blurred vision.  Having feelings of numbness or tingling.  Having trouble talking.  Having muscle weakness. DIAGNOSIS  A migraine headache is often diagnosed based on:  Symptoms.  Physical exam.  A CT scan or MRI of your head. These imaging tests cannot diagnose migraines, but they can help rule out other causes of headaches. TREATMENT Medicines may be given for pain and nausea. Medicines can also be given to help prevent recurrent migraines.  HOME CARE INSTRUCTIONS  Only take over-the-counter or prescription medicines for pain or discomfort as directed by your  health care provider. The use of long-term narcotics is not recommended.  Lie down in a dark, quiet room when you have a migraine.  Keep a journal to find out what may trigger your migraine headaches. For example, write down:  What you eat and drink.  How much sleep you get.  Any change to your diet or medicines.  Limit alcohol consumption.  Quit smoking if you smoke.  Get 7 9 hours of sleep, or as recommended by your health care provider.  Limit stress.  Keep lights dim if bright lights bother you and make your migraines worse. SEEK IMMEDIATE MEDICAL CARE IF:   Your migraine becomes severe.  You have a fever.  You have a stiff neck.  You have vision loss.  You have muscular weakness or loss of muscle control.  You start losing your balance or have trouble walking.  You feel faint or pass out.  You have severe symptoms that are different from your first symptoms. MAKE SURE YOU:   Understand these instructions.  Will watch your condition.  Will get help right away if you are not doing well or get worse. Document Released: 12/10/2005 Document Revised: 09/30/2013 Document Reviewed: 08/17/2013 [employer] Patient Information 2014 [employer].  

## 2014-06-09 ENCOUNTER — Telehealth: Payer: Self-pay | Admitting: Neurology

## 2014-06-09 NOTE — Telephone Encounter (Signed)
Please let patient know, she has chronic headaches for many years, most recent visit with Hoyle Sauer was in April 20 15, followup visit in July, with me, will discuss medication options

## 2014-06-09 NOTE — Telephone Encounter (Signed)
I called the patient back.  Relayed Dr Rhea Belton note.  She verbalized understanding.

## 2014-06-09 NOTE — Telephone Encounter (Signed)
Pt called states the 3 medications she is taking is not working and she needs to see if Dr. [name] can prescribe something else. The following are the ones pt states are not working. 1. cyclobenzaprine (FLEXERIL) 5 MG tablet 2. metoCLOPramide (REGLAN) 5 MG tablet 3. tiZANidine (ZANAFLEX) 2 MG tablet Please call pt back concerning this matter. Thanks

## 2014-06-09 NOTE — Telephone Encounter (Signed)
I called the patient back.  She says she is having pain in the left side of her head and difficultly sleeping.  States she has always had this issue, but it has gotten worse,  Although the med regiment she is on had been working for quite some time, she now feels Flexeril, Zanaflex and Remeron are no longer effective for her and she would like to know if the doses need to be changed or if something different should be prescribed.   (Per notes she has tried many several medications including Imitrex, Zomig, Maxalt, Prednisone, Depakote, Magnesium, Nortriptyline, Topamax, Verapamil, Toradol, Indocin and Tylenol.)  Please advise.  Thank you.

## 2014-06-10 ENCOUNTER — Ambulatory Visit
Admission: RE | Admit: 2014-06-10 | Discharge: 2014-06-10 | Disposition: A | Discharge status: Home / Self Care | Payer: BC Managed Care – PPO | Source: Ambulatory Visit | Attending: Gastroenterology | Admitting: Gastroenterology

## 2014-06-10 ENCOUNTER — Other Ambulatory Visit: Payer: Self-pay | Admitting: Gastroenterology

## 2014-06-10 DIAGNOSIS — K5901 Slow transit constipation: Secondary | ICD-10-CM

## 2014-06-10 DIAGNOSIS — R159 Full incontinence of feces: Secondary | ICD-10-CM

## 2014-06-10 DIAGNOSIS — R109 Unspecified abdominal pain: Secondary | ICD-10-CM

## 2014-06-15 DIAGNOSIS — E119 Type 2 diabetes mellitus without complications: Secondary | ICD-10-CM

## 2014-06-15 NOTE — Progress Notes (Signed)
Patient was seen on 06/15/14 for the third of a series of three diabetes self-management courses at the [facility]. The following learning objectives were met by the patient during this class:    State the amount of activity recommended for healthy living   Describe activities suitable for individual needs   Identify ways to regularly incorporate activity into daily life   Identify barriers to activity and ways to over come these barriers  Identify diabetes medications being personally used and their primary action for lowering glucose and possible side effects   Describe role of stress on blood glucose and develop strategies to address psychosocial issues   Identify diabetes complications and ways to prevent them  Explain how to manage diabetes during illness   Evaluate success in meeting personal goal   Establish 2-3 goals that they will plan to diligently work on until they return for the  4-monthfollow-up visit  Goals:  Follow Diabetes Meal Plan as instructed  Aim for 15-30 mins of physical activity daily as tolerated  Bring food record and glucose log to your follow up visit  Your patient has established the following 4 month goals in their individualized success plan: I will count my carb choices at most meals and snacks I will increase my activity level at least 3 days a week  Your patient has identified these potential barriers to change:  Stress  Your patient has identified their diabetes self-care support plan as  [facility]  Family support  Magazine subscription  On-Line resources  Plan:  Attend Core 4 in 4 months

## 2014-06-24 ENCOUNTER — Other Ambulatory Visit: Payer: Self-pay | Admitting: Gastroenterology

## 2014-06-28 ENCOUNTER — Telehealth: Payer: Self-pay | Admitting: Nurse Practitioner

## 2014-06-28 NOTE — Telephone Encounter (Signed)
Reviewed headache diary sent to me for May and June For May 4#2 15#3 7#4 4#5 For June 4#2 15#3 7#4 4#5 Please keep appt with Dr. [name] on July 16th.

## 2014-06-29 NOTE — Telephone Encounter (Signed)
Called patient and left a message that NP CM received her headache diary and has reviewed it and to keep her appointment with Dr [name] on 07/08/14 at 11 am.

## 2014-07-03 ENCOUNTER — Encounter (HOSPITAL_COMMUNITY): Payer: Self-pay | Admitting: Emergency Medicine

## 2014-07-03 ENCOUNTER — Emergency Department (HOSPITAL_COMMUNITY)
Admission: EM | Admit: 2014-07-03 | Discharge: 2014-07-04 | Disposition: A | Discharge status: Home / Self Care | Payer: BC Managed Care – PPO | Attending: Emergency Medicine | Admitting: Emergency Medicine

## 2014-07-03 DIAGNOSIS — E7800 Pure hypercholesterolemia, unspecified: Secondary | ICD-10-CM | POA: Insufficient documentation

## 2014-07-03 DIAGNOSIS — G43909 Migraine, unspecified, not intractable, without status migrainosus: Secondary | ICD-10-CM

## 2014-07-03 DIAGNOSIS — E119 Type 2 diabetes mellitus without complications: Secondary | ICD-10-CM | POA: Diagnosis not present

## 2014-07-03 DIAGNOSIS — Z79899 Other long term (current) drug therapy: Secondary | ICD-10-CM | POA: Insufficient documentation

## 2014-07-03 DIAGNOSIS — G40909 Epilepsy, unspecified, not intractable, without status epilepticus: Secondary | ICD-10-CM | POA: Diagnosis not present

## 2014-07-03 LAB — CBG MONITORING, ED: Glucose-Capillary: 117 mg/dL — ABNORMAL HIGH (ref 70–99)

## 2014-07-03 NOTE — ED Notes (Signed)
Pt c/o migraine x 2 days. Pt states home medications not relieving pain. Denies n/v. Light and some sound sensitivity

## 2014-07-04 DIAGNOSIS — G43909 Migraine, unspecified, not intractable, without status migrainosus: Secondary | ICD-10-CM | POA: Diagnosis not present

## 2014-07-04 MED ORDER — SODIUM CHLORIDE 0.9 % IV BOLUS (SEPSIS)
1000.0000 mL | Freq: Once | INTRAVENOUS | Status: AC
  Administered 2014-07-04: 1000 mL via INTRAVENOUS

## 2014-07-04 MED ORDER — BUTALBITAL-APAP-CAFFEINE 50-325-40 MG PO TABS: 1.0000 | ORAL_TABLET | Freq: Four times a day (QID) | ORAL | Status: DC | PRN

## 2014-07-04 MED ORDER — KETOROLAC TROMETHAMINE 30 MG/ML IJ SOLN
30.0000 mg | Freq: Once | INTRAMUSCULAR | Status: AC
  Administered 2014-07-04: 30 mg via INTRAVENOUS
  Filled 2014-07-04: qty 1

## 2014-07-04 MED ORDER — DEXAMETHASONE SODIUM PHOSPHATE 10 MG/ML IJ SOLN
10.0000 mg | Freq: Once | INTRAMUSCULAR | Status: AC
  Administered 2014-07-04: 10 mg via INTRAVENOUS
  Filled 2014-07-04: qty 1

## 2014-07-04 MED ORDER — DIPHENHYDRAMINE HCL 50 MG/ML IJ SOLN
25.0000 mg | Freq: Once | INTRAMUSCULAR | Status: AC
  Administered 2014-07-04: 25 mg via INTRAVENOUS
  Filled 2014-07-04: qty 1

## 2014-07-04 MED ORDER — METOCLOPRAMIDE HCL 5 MG/ML IJ SOLN
10.0000 mg | Freq: Once | INTRAMUSCULAR | Status: AC
  Administered 2014-07-04: 10 mg via INTRAVENOUS
  Filled 2014-07-04: qty 2

## 2014-07-04 NOTE — ED Provider Notes (Signed)
CSN: [id number]     Arrival date & time 07/03/14  2114 History   First MD Initiated Contact with Patient 07/04/14 0119     Chief Complaint  Patient presents with  . Migraine    HPI  History provided by the patient and significant other. The patient is a 63 year old female with history of hypercholesterolemia, hypertension, diabetes, seizure disorder and migraine headaches who presents with intractable left-sided migraine headache for the past 2 days. Symptoms were waxing and waning initially but became more persistent and intense on Friday. Patient has taken many of her preventative and abortive migraine medications without any relief. She complains she has had some worsening issues in the past several months regulating her headache symptoms. She has seen her neurologist 3 months ago but at that time did not wish to make any medication changes. Patient was seen and treated for migraine in the emergency department basically and reports good improvement for about 2 days until headaches returned. Headaches are typical for her migraines. There is no associated fever, chills or sweats. No nausea vomiting. No weakness or numbness. No confusion or speech changes. Headaches are worsened with light and loud noise. No other aggravating or alleviating factors. No other associated symptoms.     Past Medical History  Diagnosis Date  . Migraine   . Seizures   . High cholesterol   . Diabetes mellitus without complication    Past Surgical History  Procedure Laterality Date  . Abdominal hysterectomy     Family History  Problem Relation Age of Onset  . Lung cancer Mother   . Hypertension Other    History  Substance Use Topics  . Smoking status: Never Smoker   . Smokeless tobacco: Never Used  . Alcohol Use: No   OB History   Grav Para Term Preterm Abortions TAB SAB Ect Mult Living                 Review of Systems  Constitutional: Negative for fever, chills and diaphoresis.  Eyes: Positive for  photophobia.  Gastrointestinal: Negative for nausea and vomiting.  Neurological: Positive for headaches. Negative for weakness and numbness.  Psychiatric/Behavioral: Negative for confusion.  All other systems reviewed and are negative.     Allergies  Aspirin  Home Medications   Prior to Admission medications   Medication Sig Start Date End Date Taking? Authorizing Provider  atorvastatin (LIPITOR) 40 MG tablet Take 40 mg by mouth daily.   Yes Historical Provider, MD  calcium-vitamin D (OSCAL WITH D) 250-125 MG-UNIT per tablet Take 1 tablet by mouth daily.   Yes Historical Provider, MD  cyclobenzaprine (FLEXERIL) 5 MG tablet Take 5 mg by mouth 3 (three) times daily as needed for muscle spasms.   Yes Historical Provider, MD  divalproex (DEPAKOTE ER) 250 MG 24 hr tablet Take 250 mg by mouth 2 (two) times daily.    Yes Historical Provider, MD  esomeprazole (NEXIUM) 40 MG capsule Take 40 mg by mouth daily before breakfast.   Yes Historical Provider, MD  ferrous sulfate 325 (65 FE) MG tablet Take 325 mg by mouth daily with breakfast.   Yes Historical Provider, MD  magnesium oxide (MAG-OX) 400 MG tablet Take 400 mg by mouth daily.    Yes Historical Provider, MD  metoCLOPramide (REGLAN) 5 MG tablet Take 5 mg by mouth 3 (three) times daily as needed for nausea (headache). 07/15/13  Yes Marcial Pacas, MD  Multiple Vitamin (MULTIVITAMIN WITH MINERALS) TABS Take 1 tablet by mouth daily.  Yes Historical Provider, MD  olmesartan (BENICAR) 40 MG tablet Take 40 mg by mouth daily.   Yes Historical Provider, MD  tiZANidine (ZANAFLEX) 4 MG tablet Take 4 mg by mouth 2 (two) times daily as needed for muscle spasms.   Yes Historical Provider, MD  topiramate (TOPAMAX) 100 MG tablet Take 100 mg by mouth 2 (two) times daily.   Yes Historical Provider, MD  verapamil (CALAN-SR) 240 MG CR tablet Take 240 mg by mouth at bedtime.   Yes Historical Provider, MD  vitamin B-12 (CYANOCOBALAMIN) 100 MCG tablet Take 50 mcg by  mouth daily.   Yes Historical Provider, MD  acetaminophen (TYLENOL) 500 MG tablet Take 1,000 mg by mouth every 6 (six) hours as needed for mild pain or headache.     Historical Provider, MD   BP 183/75  Pulse 62  Temp(Src) 98 F (36.7 C) (Oral)  Resp 14  Ht 5' (1.524 m)  Wt 137 lb (62.143 kg)  BMI 26.76 kg/m2  SpO2 100% Physical Exam  Nursing note and vitals reviewed. Constitutional: She is oriented to person, place, and time. She appears well-developed and well-nourished. No distress.  HENT:  Head: Normocephalic and atraumatic.  Eyes: Conjunctivae and EOM are normal. Pupils are equal, round, and reactive to light.  Neck: Normal range of motion. Neck supple.  No meningeal signs  Cardiovascular: Normal rate and regular rhythm.   No murmur heard. Pulmonary/Chest: Effort normal and breath sounds normal. No respiratory distress. She has no wheezes. She has no rales.  Abdominal: Soft. There is no tenderness. There is no rigidity, no rebound, no guarding, no CVA tenderness and no tenderness at McBurney's point.  Neurological: She is alert and oriented to person, place, and time. She has normal strength. No cranial nerve deficit or sensory deficit. Gait normal.  Skin: Skin is warm and dry. No rash noted.  Psychiatric: She has a normal mood and affect. Her behavior is normal.    ED Course  Procedures   COORDINATION OF CARE:  Nursing notes reviewed. Vital signs reviewed. Initial pt interview and examination performed.   Filed Vitals:   07/03/14 2228 07/03/14 2244  BP: 183/75   Pulse: 62   Temp: 98 F (36.7 C)   TempSrc: Oral   Resp: 14   Height:  5' (1.524 m)  Weight:  137 lb (62.143 kg)  SpO2: 100%     2:05 AM- patient seen and evaluated. She appears well in some discomfort. Does not appear severely ill or toxic. headache is similar to previous past headaches. Patient is followed by neurology specialist on several preventative and abortive migraine medications which did not  help at home. No other concerning or red flag symptoms to her headache tonight. We'll plan to give migraine cocktail and reassess.  3:50 AM patient reports good improvement of headache. The pain is now 1/10. She does request to continue resting for a short period to see if headache will completely resolve.  Patient continues to be feeling well. She is ready to be discharged at this time.  Treatment plan initiated: Medications  sodium chloride 0.9 % bolus 1,000 mL (1,000 mLs Intravenous New Bag/Given 07/04/14 0159)  diphenhydrAMINE (BENADRYL) injection 25 mg (25 mg Intravenous Given 07/04/14 0203)  metoCLOPramide (REGLAN) injection 10 mg (10 mg Intravenous Given 07/04/14 0159)  dexamethasone (DECADRON) injection 10 mg (10 mg Intravenous Given 07/04/14 0159)  ketorolac (TORADOL) 30 MG/ML injection 30 mg (30 mg Intravenous Given 07/04/14 0159)     Results for orders  placed during the hospital encounter of 07/03/14  CBG MONITORING, ED      Result Value Ref Range   Glucose-Capillary 117 (*) 70 - 99 mg/dL   Comment 1 Documented in Chart     Comment 2 Notify RN        MDM   Final diagnoses:  Migraine without status migrainosus, not intractable, unspecified migraine type        Martie Lee, PA-C 07/04/14 2250

## 2014-07-04 NOTE — Discharge Instructions (Signed)
Please followup with your doctors for continued evaluation and treatment.   Migraine Headache A migraine headache is an intense, throbbing pain on one or both sides of your head. A migraine can last for 30 minutes to several hours. CAUSES  The exact cause of a migraine headache is not always known. However, a migraine may be caused when nerves in the brain become irritated and release chemicals that cause inflammation. This causes pain. Certain things may also trigger migraines, such as:  Alcohol.  Smoking.  Stress.  Menstruation.  Aged cheeses.  Foods or drinks that contain nitrates, glutamate, aspartame, or tyramine.  Lack of sleep.  Chocolate.  Caffeine.  Hunger.  Physical exertion.  Fatigue.  Medicines used to treat chest pain (nitroglycerine), birth control pills, estrogen, and some blood pressure medicines. SIGNS AND SYMPTOMS  Pain on one or both sides of your head.  Pulsating or throbbing pain.  Severe pain that prevents daily activities.  Pain that is aggravated by any physical activity.  Nausea, vomiting, or both.  Dizziness.  Pain with exposure to bright lights, loud noises, or activity.  General sensitivity to bright lights, loud noises, or smells. Before you get a migraine, you may get warning signs that a migraine is coming (aura). An aura may include:  Seeing flashing lights.  Seeing bright spots, halos, or zig-zag lines.  Having tunnel vision or blurred vision.  Having feelings of numbness or tingling.  Having trouble talking.  Having muscle weakness. DIAGNOSIS  A migraine headache is often diagnosed based on:  Symptoms.  Physical exam.  A CT scan or MRI of your head. These imaging tests cannot diagnose migraines, but they can help rule out other causes of headaches. TREATMENT Medicines may be given for pain and nausea. Medicines can also be given to help prevent recurrent migraines.  HOME CARE INSTRUCTIONS  Only take  over-the-counter or prescription medicines for pain or discomfort as directed by your health care provider. The use of long-term narcotics is not recommended.  Lie down in a dark, quiet room when you have a migraine.  Keep a journal to find out what may trigger your migraine headaches. For example, write down:  What you eat and drink.  How much sleep you get.  Any change to your diet or medicines.  Limit alcohol consumption.  Quit smoking if you smoke.  Get 7-9 hours of sleep, or as recommended by your health care provider.  Limit stress.  Keep lights dim if bright lights bother you and make your migraines worse. SEEK IMMEDIATE MEDICAL CARE IF:   Your migraine becomes severe.  You have a fever.  You have a stiff neck.  You have vision loss.  You have muscular weakness or loss of muscle control.  You start losing your balance or have trouble walking.  You feel faint or pass out.  You have severe symptoms that are different from your first symptoms. MAKE SURE YOU:   Understand these instructions.  Will watch your condition.  Will get help right away if you are not doing well or get worse. Document Released: 12/10/2005 Document Revised: 09/30/2013 Document Reviewed: 08/17/2013 [facility] Patient Information 2015 [place], [place]. This information is not intended to replace advice given to you by your health care provider. Make sure you discuss any questions you have with your health care provider.

## 2014-07-05 NOTE — ED Provider Notes (Signed)
Medical screening examination/treatment/procedure(s) were performed by non-physician practitioner and as supervising physician I was immediately available for consultation/collaboration.   EKG Interpretation None       Kalman Drape, MD 07/05/14 [phone]

## 2014-07-08 ENCOUNTER — Ambulatory Visit (INDEPENDENT_AMBULATORY_CARE_PROVIDER_SITE_OTHER): Payer: BC Managed Care – PPO | Admitting: Neurology

## 2014-07-08 ENCOUNTER — Telehealth: Payer: Self-pay

## 2014-07-08 ENCOUNTER — Encounter: Payer: Self-pay | Admitting: Neurology

## 2014-07-08 ENCOUNTER — Encounter (INDEPENDENT_AMBULATORY_CARE_PROVIDER_SITE_OTHER): Payer: Self-pay

## 2014-07-08 VITALS — BP 169/79 | HR 70 | Ht 61.0 in | Wt 136.5 lb

## 2014-07-08 DIAGNOSIS — R51 Headache: Secondary | ICD-10-CM

## 2014-07-08 DIAGNOSIS — G8929 Other chronic pain: Secondary | ICD-10-CM

## 2014-07-08 MED ORDER — ELETRIPTAN HYDROBROMIDE 40 MG PO TABS: ORAL_TABLET | ORAL | Status: DC

## 2014-07-08 MED ORDER — TOPIRAMATE 100 MG PO TABS: 100.0000 mg | ORAL_TABLET | Freq: Two times a day (BID) | ORAL | Status: DC

## 2014-07-08 MED ORDER — DIVALPROEX SODIUM ER 250 MG PO TB24: 250.0000 mg | ORAL_TABLET | Freq: Two times a day (BID) | ORAL | Status: DC

## 2014-07-08 NOTE — Telephone Encounter (Signed)
Dr.Yan wrote RX hand written for [facility]. Dr.Yan's first RX . I called [facility] and asked the process they told me to fax Dr. Rhea Belton hand written RX to [phone]. Faxed insurance card as well.  Fax will go to [place] our pharmacy.

## 2014-07-08 NOTE — Progress Notes (Signed)
[facility]  PATIENT: [name] DOB: 1951-04-01  HISTORY OF PRESENT ILLNESS:  [name] is a 63 yoright-handed African American married female with her husband today, with daily left hemicranial headaches lasting 30 minutes to 1-1/2 hours usually occurring 5 or 6 times per day, previous patient of Dr. Erling Cruz.  This has been ongoing for 30 years.  Her headaches are always on the left side. She also has a history of complex partial Seizures, last seizure was 2007.  She had an MRI studies of her brain 05/2009, 09/15/09, and 07/02/12 showing stable en plaque meningiomas, one over the left lateral convexity and the other in the middle of the anterior fossa.   There is improvement in the frequency and severity of her headaches with the use of Indomethacin in the past. It cuts down her headaches approximately 75%. Indomethacin is associated with elevated creatinine. She is followed by Dr.Webb. Her creatinine runs 1.19. Because of this, she discontinued her indomethacin .   She tried Botox by Dr. Tyron Russell and Addelman in the past without benefit . Nasal oxygen increases the speed with which her headache pain relief occurs.   Her headache is always located at left side,  about five-six times a day, lasting less than one hour. She has stopped working at [employer] because of headaches, She is alternating between tylenol, tizanidin, flexeril, Reglan, oxygen during headaches.   She has tried Imitrex, Maxalt , zomig does not work, she worries about its over use too.    She has been tried on multiple preventive in the past. Her insurance has denied Botox. She is currently taking Depakote, Flexeril, magnesium, nortriptyline Zanaflex, Topamax, and verapamil, as preventives. She also use home oxygen about 4 times a day for her headaches.  Most recent ED visit was in July 04 2014, she was given prescription of Fioricet, which has helped her some, she denies significant side  effect.  REVIEW OF SYSTEMS: Full 14 system review of systems performed and notable only for those listed, all others are neg:     ALLERGIES: Allergies  Allergen Reactions  . Aspirin Other (See Comments)    Could cause ulcers    HOME MEDICATIONS: Outpatient Prescriptions Prior to Visit  Medication Sig Dispense Refill  . atorvastatin (LIPITOR) 40 MG tablet Take 40 mg by mouth daily.      . butalbital-acetaminophen-caffeine (FIORICET) 50-325-40 MG per tablet Take 1 tablet by mouth every 6 (six) hours as needed for headache.  20 tablet  0  . calcium-vitamin D (OSCAL WITH D) 250-125 MG-UNIT per tablet Take 1 tablet by mouth daily.      . divalproex (DEPAKOTE ER) 250 MG 24 hr tablet Take 250 mg by mouth 2 (two) times daily.       Marland Kitchen esomeprazole (NEXIUM) 40 MG capsule Take 40 mg by mouth daily before breakfast.      . ferrous sulfate 325 (65 FE) MG tablet Take 325 mg by mouth daily with breakfast.      . magnesium oxide (MAG-OX) 400 MG tablet Take 400 mg by mouth daily.       . metoCLOPramide (REGLAN) 5 MG tablet Take 5 mg by mouth 3 (three) times daily as needed for nausea (headache).      . Multiple Vitamin (MULTIVITAMIN WITH MINERALS) TABS Take 1 tablet by mouth daily.      Marland Kitchen olmesartan (BENICAR) 40 MG tablet Take 40 mg by mouth daily.      Marland Kitchen tiZANidine (ZANAFLEX) 4  MG tablet Take 4 mg by mouth 2 (two) times daily as needed for muscle spasms.      Marland Kitchen topiramate (TOPAMAX) 100 MG tablet Take 100 mg by mouth 2 (two) times daily.      . verapamil (CALAN-SR) 240 MG CR tablet Take 240 mg by mouth at bedtime.      . vitamin B-12 (CYANOCOBALAMIN) 100 MCG tablet Take 50 mcg by mouth daily.      Marland Kitchen acetaminophen (TYLENOL) 500 MG tablet Take 1,000 mg by mouth every 6 (six) hours as needed for mild pain or headache.       . cyclobenzaprine (FLEXERIL) 5 MG tablet Take 5 mg by mouth 3 (three) times daily as needed for muscle spasms.       No facility-administered medications prior to visit.    PAST  MEDICAL HISTORY: Past Medical History  Diagnosis Date  . Migraine   . Seizures   . High cholesterol   . Diabetes mellitus without complication     PAST SURGICAL HISTORY: Past Surgical History  Procedure Laterality Date  . Abdominal hysterectomy      FAMILY HISTORY: Family History  Problem Relation Age of Onset  . Lung cancer Mother   . Hypertension Other   . Kidney failure Father     SOCIAL HISTORY: History   Social History  . Marital Status: Married    Spouse Name: Elenore Rota    Number of Children: 3  . Years of Education: college   Occupational History  .      does not work   Social History Main Topics  . Smoking status: Never Smoker   . Smokeless tobacco: Never Used  . Alcohol Use: No  . Drug Use: No  . Sexual Activity: Not on file   Other Topics Concern  . Not on file   Social History Narrative   Patient lives at home with her husband Elenore Rota). Patient was a homemaker.   Right handed.   College education.   Caffeine- One cup of coffee daily.   Patient has three children.     PHYSICAL EXAM  Filed Vitals:   07/08/14 1053  BP: 169/79  Pulse: 70  Height: 5\' 1"  (1.549 m)  Weight: 136 lb 8 oz (61.916 kg)   Body mass index is 25.8 kg/(m^2).  Generalized: Well developed, in no acute distress  Head: normocephalic and atraumatic,. Oropharynx benign  Neck: Supple, no carotid bruits  Cardiac: Regular rate rhythm, no murmur  Musculoskeletal: No deformity   Neurological examination   Mentation: stressed Alert oriented to time, place, history taking. Follows all commands speech and language fluent  Cranial nerve II-XII: Pupils were equal round reactive to light extraocular movements were full, visual field were full on confrontational test. Facial sensation and strength were normal. hearing was intact to finger rubbing bilaterally. Uvula tongue midline. head turning and shoulder shrug were normal and symmetric.Tongue protrusion into cheek strength was  normal. Motor: normal bulk and tone, full strength  Sensory: normal and symmetric to light touch, pinprick, and  vibration  Coordination: finger-nose-finger, heel-to-shin bilaterally, no dysmetria Reflexes: Brachioradialis 2/2, biceps 2/2, triceps 2/2, patellar 2/2, Achilles 2/2, plantar responses were flexor bilaterally. Gait and Station: Rising up from seated position without assistance, normal stance,  moderate stride, good arm swing, smooth turning, able to perform tiptoe, and heel walking without difficulty. Tandem gait is steady  DIAGNOSTIC DATA (LABS, IMAGING, TESTING) - ASSESSMENT AND PLAN  63 y.o. year old female  has a past medical  history of Migraine; left hemicranial headaches with symptoms of eyelid droop nasal stuffiness  and eye tearing , and Seizures, on polypharmacy treatment, including Depakote, Topamax, continue had daily headaches, has tried and failed multiple preventative medications in the past, insurance would not approve for Botox injection,  1, Cefaly Rx was writtent. 2. Relpax prn. 3. RTC in 4-6 months with [facility] [address] [place],  [address] [phone]

## 2014-07-09 ENCOUNTER — Telehealth: Payer: Self-pay | Admitting: Neurology

## 2014-07-09 MED ORDER — ELETRIPTAN HYDROBROMIDE 40 MG PO TABS: ORAL_TABLET | ORAL | Status: DC

## 2014-07-09 NOTE — Telephone Encounter (Signed)
Rx has been resent 

## 2014-07-09 NOTE — Telephone Encounter (Signed)
All info received

## 2014-07-09 NOTE — Telephone Encounter (Signed)
Patient would like Relpax medication called to the [facility] on [address] and not to the mail order pharmacy.

## 2014-07-12 ENCOUNTER — Telehealth: Payer: Self-pay | Admitting: Neurology

## 2014-07-13 ENCOUNTER — Telehealth: Payer: Self-pay | Admitting: Neurology

## 2014-07-13 NOTE — Telephone Encounter (Signed)
Patient stated pharmacy would not fill Rx metoCLOPramide (REGLAN) 5 MG tablet due to insurance would not pay for it.  [facility] pharmacy was contacting our office for alternative medication.  Please call and advise.  Thanks

## 2014-07-13 NOTE — Telephone Encounter (Signed)
Patient called [place] last month and said Reglan did not work for her.  I called the patient back.  She said she was not calling about Reglan, she was calling about Relpax.

## 2014-07-13 NOTE — Telephone Encounter (Signed)
I spoke with Mr. [name].  He is aware they will need to contact the company directly to request they fill this Rx at [phone].

## 2014-07-13 NOTE — Telephone Encounter (Signed)
I called the pharmacy and spoke with Cristie Hem.  He said Relpax is covered on the patients plan, however, ins will not pay for it again until 09/04 because they said they've already paid for this medication claim, likely through mail order.  I called the patient back.  Relayed info provided by the pharmacist.  She will call [facility] and call us back if needed.

## 2014-07-13 NOTE — Telephone Encounter (Signed)
Please advise 

## 2014-07-15 ENCOUNTER — Telehealth: Payer: Self-pay | Admitting: Neurology

## 2014-07-15 NOTE — Telephone Encounter (Signed)
Spouse indicates Depakote and Topamax are not working for migraine headaches.   Last OV note says: Her headache is always located at left side, about five-six times a day, lasting less than one hour. She is alternating between tylenol, tizanidine, flexeril, Reglan, oxygen during headaches. She has tried Imitrex, Maxalt , zomig does not work. She has been tried on multiple preventive in the past. Her insurance has denied Botox. She is currently taking Depakote, Flexeril, magnesium, nortriptyline Zanaflex, Topamax, and verapamil, as preventives. She also use home oxygen about 4 times a day for her headaches. She also takes Relpax.  Most recent ED visit was in July 04 2014, she was given prescription of Fioricet, which has helped her some, she denies significant side effect.  Has a past medical history of Migraine; left hemicranial headaches with symptoms of eyelid droop nasal stuffiness and eye tearing , and Seizures.  They are requesting a different medication.  Dr [name] is out of the office.  Forwarding to [facility] for review.   Please advise.  Thank you.

## 2014-07-15 NOTE — Telephone Encounter (Signed)
Spouse stated patient has had migraine since Wednesday was seen in ER.  divalproex (DEPAKOTE ER) 250 MG 24 hr tablet and topiramate (TOPAMAX) 100 MG tablet is not working, requesting something else for migraine.  Please call and advise.

## 2014-07-16 NOTE — Telephone Encounter (Signed)
Called the listed number and left message on answering machine

## 2014-07-22 ENCOUNTER — Ambulatory Visit (INDEPENDENT_AMBULATORY_CARE_PROVIDER_SITE_OTHER): Payer: BC Managed Care – PPO

## 2014-07-22 ENCOUNTER — Telehealth: Payer: Self-pay

## 2014-07-22 ENCOUNTER — Other Ambulatory Visit: Payer: Self-pay | Admitting: Neurology

## 2014-07-22 DIAGNOSIS — R51 Headache: Secondary | ICD-10-CM

## 2014-07-22 DIAGNOSIS — G8929 Other chronic pain: Secondary | ICD-10-CM

## 2014-07-22 NOTE — Telephone Encounter (Signed)
[employer] notified us they have approved our request for coverage on Relpax effective until 07/21/2015 Ref # [id number]

## 2014-07-26 ENCOUNTER — Telehealth: Payer: Self-pay | Admitting: *Deleted

## 2014-07-26 NOTE — Progress Notes (Signed)
Quick Note:  Shared normal MR Brain results with patient thru VM message. ______

## 2014-07-26 NOTE — Telephone Encounter (Signed)
Will forward message to Hilda Blades in medical Records.

## 2014-07-26 NOTE — Telephone Encounter (Signed)
Called patient with MRI results, she verbalized understanding and wanted a copy of those results, requesting to pick them up when ready

## 2014-07-26 NOTE — Telephone Encounter (Signed)
Patient calling back  Regarding MRI results.  Please return call anytime and if not available please leave message.  Thanks

## 2014-07-26 NOTE — Telephone Encounter (Signed)
Please call patient for normal MRI brain 

## 2014-07-29 NOTE — Telephone Encounter (Signed)
Noted  

## 2014-08-03 ENCOUNTER — Other Ambulatory Visit: Payer: Self-pay | Admitting: Neurology

## 2014-09-10 DIAGNOSIS — G43719 Chronic migraine without aura, intractable, without status migrainosus: Secondary | ICD-10-CM | POA: Insufficient documentation

## 2014-10-22 ENCOUNTER — Encounter (HOSPITAL_COMMUNITY): Payer: Self-pay | Admitting: Emergency Medicine

## 2014-10-22 ENCOUNTER — Emergency Department (HOSPITAL_COMMUNITY)
Admission: EM | Admit: 2014-10-22 | Discharge: 2014-10-22 | Disposition: A | Discharge status: Home / Self Care | Payer: BC Managed Care – PPO | Attending: Emergency Medicine | Admitting: Emergency Medicine

## 2014-10-22 DIAGNOSIS — G43809 Other migraine, not intractable, without status migrainosus: Secondary | ICD-10-CM | POA: Diagnosis not present

## 2014-10-22 DIAGNOSIS — Z79899 Other long term (current) drug therapy: Secondary | ICD-10-CM | POA: Insufficient documentation

## 2014-10-22 DIAGNOSIS — G40909 Epilepsy, unspecified, not intractable, without status epilepticus: Secondary | ICD-10-CM | POA: Insufficient documentation

## 2014-10-22 DIAGNOSIS — E780 Pure hypercholesterolemia: Secondary | ICD-10-CM | POA: Diagnosis not present

## 2014-10-22 DIAGNOSIS — E119 Type 2 diabetes mellitus without complications: Secondary | ICD-10-CM | POA: Insufficient documentation

## 2014-10-22 DIAGNOSIS — G43909 Migraine, unspecified, not intractable, without status migrainosus: Secondary | ICD-10-CM | POA: Diagnosis present

## 2014-10-22 MED ORDER — HYDROCODONE-ACETAMINOPHEN 5-325 MG PO TABS: 1.0000 | ORAL_TABLET | Freq: Four times a day (QID) | ORAL | Status: DC | PRN

## 2014-10-22 MED ORDER — HYDROCODONE-ACETAMINOPHEN 5-325 MG PO TABS
1.0000 | ORAL_TABLET | Freq: Once | ORAL | Status: AC
  Administered 2014-10-22: 1 via ORAL
  Filled 2014-10-22: qty 1

## 2014-10-22 MED ORDER — HYDROMORPHONE HCL 1 MG/ML IJ SOLN
1.0000 mg | Freq: Once | INTRAMUSCULAR | Status: AC
  Administered 2014-10-22: 1 mg via INTRAVENOUS
  Filled 2014-10-22: qty 1

## 2014-10-22 MED ORDER — METOCLOPRAMIDE HCL 5 MG/ML IJ SOLN
10.0000 mg | Freq: Once | INTRAMUSCULAR | Status: AC
  Administered 2014-10-22: 10 mg via INTRAVENOUS
  Filled 2014-10-22: qty 2

## 2014-10-22 NOTE — ED Notes (Signed)
CBG 123 

## 2014-10-22 NOTE — ED Notes (Signed)
Per pt sts migraine since last night with hx of the same. Denies vision problems N,V.

## 2014-10-22 NOTE — ED Provider Notes (Addendum)
CSN: [id number]     Arrival date & time 10/22/14  38 History   First MD Initiated Contact with Patient 10/22/14 1601     Chief Complaint  Patient presents with  . Migraine     (Consider location/radiation/quality/duration/timing/severity/associated sxs/prior Treatment) Patient is a 63 y.o. female presenting with migraines.  Migraine Associated symptoms include headaches.   Complains of left-sided headache parietal area nonradiating throbbing in nature and typical of migraine headache she's had for the past 30 years. Associated symptoms include photophobia no nausea no fever no trauma. Treated with Zanaflex, without relief no other associated symptoms. Pain made worse with exposure to light not improved by anything. Patient gets similar headaches daily. Has needed to come to the hospital for treatment of headaches approximately 3 times per year. No other associated symptoms Past Medical History  Diagnosis Date  . Migraine   . Seizures   . High cholesterol   . Diabetes mellitus without complication    Past Surgical History  Procedure Laterality Date  . Abdominal hysterectomy     Family History  Problem Relation Age of Onset  . Lung cancer Mother   . Hypertension Other   . Kidney failure Father    History  Substance Use Topics  . Smoking status: Never Smoker   . Smokeless tobacco: Never Used  . Alcohol Use: No   OB History   Grav Para Term Preterm Abortions TAB SAB Ect Mult Living                 Review of Systems  Constitutional: Negative.   Eyes: Positive for photophobia.  Respiratory: Negative.   Cardiovascular: Negative.   Gastrointestinal: Negative.   Musculoskeletal: Negative.   Skin: Negative.   Neurological: Positive for headaches.  Psychiatric/Behavioral: Negative.   All other systems reviewed and are negative.     Allergies  Aspirin  Home Medications   Prior to Admission medications   Medication Sig Start Date End Date Taking? Authorizing  Provider  atorvastatin (LIPITOR) 40 MG tablet Take 40 mg by mouth daily.    Historical Provider, MD  butalbital-acetaminophen-caffeine (FIORICET) [phone] MG per tablet Take 1 tablet by mouth every 6 (six) hours as needed for headache. 07/04/14 07/04/15  Ruthell Rummage Dammen, PA-C  calcium-vitamin D (OSCAL WITH D) 250-125 MG-UNIT per tablet Take 1 tablet by mouth daily.    Historical Provider, MD  divalproex (DEPAKOTE ER) 250 MG 24 hr tablet Take 1 tablet (250 mg total) by mouth 2 (two) times daily. 07/08/14   Marcial Pacas, MD  eletriptan (RELPAX) 40 MG tablet One tablet by mouth at onset of headache. May repeat in 2 hours if headache persists or recurs. 07/09/14   Marcial Pacas, MD  esomeprazole (NEXIUM) 40 MG capsule Take 40 mg by mouth daily before breakfast.    Historical Provider, MD  ferrous sulfate 325 (65 FE) MG tablet Take 325 mg by mouth daily with breakfast.    Historical Provider, MD  magnesium oxide (MAG-OX) 400 MG tablet Take 400 mg by mouth daily.     Historical Provider, MD  metoCLOPramide (REGLAN) 5 MG tablet Take 1 tablet by mouth 3  times a day as needed 08/03/14   Marcial Pacas, MD  Multiple Vitamin (MULTIVITAMIN WITH MINERALS) TABS Take 1 tablet by mouth daily.    Historical Provider, MD  olmesartan (BENICAR) 40 MG tablet Take 40 mg by mouth daily.    Historical Provider, MD  tiZANidine (ZANAFLEX) 2 MG tablet Take 1 tablet by mouth  twice a day 08/03/14   Marcial Pacas, MD  tiZANidine (ZANAFLEX) 4 MG tablet Take 4 mg by mouth 2 (two) times daily as needed for muscle spasms.    Historical Provider, MD  topiramate (TOPAMAX) 100 MG tablet Take 1 tablet (100 mg total) by mouth 2 (two) times daily. 07/08/14   Marcial Pacas, MD  verapamil (CALAN-SR) 240 MG CR tablet Take 240 mg by mouth at bedtime.    Historical Provider, MD  vitamin B-12 (CYANOCOBALAMIN) 100 MCG tablet Take 50 mcg by mouth daily.    Historical Provider, MD   BP 201/77  Pulse 62  Temp(Src) 98.4 F (36.9 C) (Oral)  Resp 18  Ht 5' (1.524 m)   Wt 130 lb (58.968 kg)  BMI 25.39 kg/m2  SpO2 100% Physical Exam  Nursing note and vitals reviewed. Constitutional: She is oriented to person, place, and time. She appears well-developed and well-nourished.  HENT:  Head: Normocephalic and atraumatic.  Eyes: Conjunctivae are normal. Pupils are equal, round, and reactive to light.  Neck: Neck supple. No tracheal deviation present. No thyromegaly present.  Cardiovascular: Normal rate and regular rhythm.   No murmur heard. Pulmonary/Chest: Effort normal and breath sounds normal.  Abdominal: Soft. Bowel sounds are normal. She exhibits no distension. There is no tenderness.  Musculoskeletal: Normal range of motion. She exhibits no edema and no tenderness.  Neurological: She is alert and oriented to person, place, and time. She has normal reflexes. No cranial nerve deficit. Coordination normal.  Gait normal Romberg normal pronator drift normal finger to nose normal DTR symmetric bilaterally at knee jerk and ankle jerk and biceps toes downgoing bilaterally  Skin: Skin is warm and dry. No rash noted.  Psychiatric: She has a normal mood and affect.    ED Course  Procedures (including critical care time) Labs Review Labs Reviewed - No data to display  Imaging Review No results found. CBG -123  Reported to me by RN  EKG Interpretation None     17 10 PM she reports some improvement in pain and symptoms after treatment with intravenous Reglan. However requesting additional pain medicine. Intravenous hydromorphone ordered. 1750 p.m. feels improved and ready to go home alert and reported Glasgow Coma Score 15  MDM  Plan prescription Norco. Return or follow-up with headache specialist in the room if headaches not well controlled Diagnosis migraine headache  Final diagnoses:  None        Orlie Dakin, MD 10/22/14 1758  6:10 PM patient reports headache worse. Norco ordered prior to discharge.  Orlie Dakin, MD 10/22/14 1816

## 2014-10-22 NOTE — Discharge Instructions (Signed)
Migraine Headache Contact your headache specialist in [facility] on Monday, 10/26/2014. Advise your physician that you need to come to the emergency department. Your medications may need to be adjusted A migraine headache is very bad, throbbing pain on one or both sides of your head. Talk to your doctor about what things may bring on (trigger) your migraine headaches. HOME CARE  Only take medicines as told by your doctor.  Lie down in a dark, quiet room when you have a migraine.  Keep a journal to find out if certain things bring on migraine headaches. For example, write down:  What you eat and drink.  How much sleep you get.  Any change to your diet or medicines.  Lessen how much alcohol you drink.  Quit smoking if you smoke.  Get enough sleep.  Lessen any stress in your life.  Keep lights dim if bright lights bother you or make your migraines worse. GET HELP RIGHT AWAY IF:   Your migraine becomes really bad.  You have a fever.  You have a stiff neck.  You have trouble seeing.  Your muscles are weak, or you lose muscle control.  You lose your balance or have trouble walking.  You feel like you will pass out (faint), or you pass out.  You have really bad symptoms that are different than your first symptoms. MAKE SURE YOU:   Understand these instructions.  Will watch your condition.  Will get help right away if you are not doing well or get worse. Document Released: 09/18/2008 Document Revised: 03/03/2012 Document Reviewed: 08/17/2013 [facility] Patient Information 2015 [place], [place]. This information is not intended to replace advice given to you by your health care provider. Make sure you discuss any questions you have with your health care provider.

## 2014-10-22 NOTE — ED Notes (Signed)
Patient ambulating in room stated headache returned and wanted to speak with Doctor. Doctor at bedside ordered additional pain medication.

## 2014-10-25 ENCOUNTER — Ambulatory Visit: Payer: BC Managed Care – PPO

## 2014-10-25 ENCOUNTER — Telehealth: Payer: Self-pay | Admitting: Neurology

## 2014-10-25 MED ORDER — DIVALPROEX SODIUM ER 500 MG PO TB24: ORAL_TABLET | ORAL | Status: DC

## 2014-10-25 NOTE — Telephone Encounter (Signed)
I called patient. The patient is on Depakote, she indicates that she has gotten some benefit with this, but her maximum dose is only been 1000 mg daily, she currently is only on 500 mg daily. I've asked her to go the 500 mg twice daily, I'll call in the 500 mg tablets, convert her to 500 mg in the morning and 1000 g in the evening after 2 weeks.

## 2014-10-25 NOTE — Telephone Encounter (Signed)
Patient's spouse, Elenore Rota stated patient is experiencing Migraines more frequently.  Patient was seen in ER on 10/30.  Questioning if Rx could be called in for Migraines.  Please call and advise.

## 2014-10-25 NOTE — Telephone Encounter (Signed)
I called back.  Spoke with patient.  She would like a new Rx prescribed for migraines.  She is currently taking Depakote, Flexeril, Magnesium, Nortriptyline, Zanaflex, Topamax, Reglan, Relpax and Verapamil.  Patient also uses home oxygen about 4 times a day for her headaches.  She has tried Maxalt, Imitrex, Zomig and Fioricet with no benefit.   Dr [name] is out of the office.  Forwarding request to [facility] for review.  Please advise.  Thank you.

## 2014-11-10 ENCOUNTER — Encounter: Payer: Self-pay | Admitting: Neurology

## 2014-11-16 ENCOUNTER — Encounter: Payer: Self-pay | Admitting: Neurology

## 2014-11-22 ENCOUNTER — Telehealth: Payer: Self-pay | Admitting: Neurology

## 2014-11-22 MED ORDER — ELETRIPTAN HYDROBROMIDE 40 MG PO TABS: 40.0000 mg | ORAL_TABLET | ORAL | Status: DC | PRN

## 2014-11-22 NOTE — Telephone Encounter (Signed)
Patient's husband is calling. Patient has not received medication Relpax from the mail order pharmacy and would like Rx called in to [facility]. Thank you

## 2014-11-22 NOTE — Telephone Encounter (Signed)
Husband called in at [place] stating that pharmacy has not got any prescription of Relpax yet. He called in again at 9pm stating that pharmacy needs pre authorization before dispense the medication. I called [facility] and no pharmacist was available, so I left message but nobody called me back so far.   Rosalin Hawking, MD PhD Stroke Neurology 11/22/2014 11:49 PM

## 2014-11-22 NOTE — Telephone Encounter (Signed)
Rx has been sent  

## 2014-11-23 NOTE — Telephone Encounter (Signed)
Prior [name] has already been approved until 07/21/2015.  I called the pharmacy.  Spoke with Ebony Hail.  She said the ins will not pay because mail order has already billed them.  There currently is not an override available.  I called the patient back.  They said the mail order shipment should arrive today or tomorrow, and they should be ok on meds until that time.  They will call us back if anything further is needed.

## 2015-01-04 DIAGNOSIS — M542 Cervicalgia: Secondary | ICD-10-CM | POA: Diagnosis not present

## 2015-01-05 DIAGNOSIS — H25813 Combined forms of age-related cataract, bilateral: Secondary | ICD-10-CM | POA: Diagnosis not present

## 2015-01-07 DIAGNOSIS — M542 Cervicalgia: Secondary | ICD-10-CM | POA: Diagnosis not present

## 2015-01-10 ENCOUNTER — Ambulatory Visit: Payer: BC Managed Care – PPO | Admitting: Nurse Practitioner

## 2015-01-11 DIAGNOSIS — G4723 Circadian rhythm sleep disorder, irregular sleep wake type: Secondary | ICD-10-CM | POA: Diagnosis not present

## 2015-01-11 DIAGNOSIS — G2581 Restless legs syndrome: Secondary | ICD-10-CM | POA: Diagnosis not present

## 2015-01-11 DIAGNOSIS — G44049 Chronic paroxysmal hemicrania, not intractable: Secondary | ICD-10-CM | POA: Diagnosis not present

## 2015-01-12 ENCOUNTER — Ambulatory Visit (INDEPENDENT_AMBULATORY_CARE_PROVIDER_SITE_OTHER): Payer: Medicare Other | Admitting: Neurology

## 2015-01-12 ENCOUNTER — Encounter: Payer: Self-pay | Admitting: Neurology

## 2015-01-12 VITALS — BP 122/69 | HR 79 | Ht 61.0 in | Wt 118.0 lb

## 2015-01-12 DIAGNOSIS — R51 Headache: Secondary | ICD-10-CM

## 2015-01-12 DIAGNOSIS — G8929 Other chronic pain: Secondary | ICD-10-CM

## 2015-01-12 MED ORDER — TIZANIDINE HCL 4 MG PO TABS
4.0000 mg | ORAL_TABLET | Freq: Two times a day (BID) | ORAL | Status: DC | PRN
Start: 2015-01-12 — End: 2015-10-10

## 2015-01-12 MED ORDER — DIVALPROEX SODIUM ER 500 MG PO TB24: ORAL_TABLET | ORAL | Status: DC

## 2015-01-12 MED ORDER — ELETRIPTAN HYDROBROMIDE 40 MG PO TABS: 40.0000 mg | ORAL_TABLET | ORAL | Status: DC | PRN

## 2015-01-12 NOTE — Progress Notes (Signed)
[facility]  PATIENT: [name] DOB: Dec 27, 1950   REASON FOR VISIT: Followup for headache   HISTORY OF PRESENT ILLNESS: [name],  HISTORY: Right-handed African American married female with daily left hemicranial headaches lasting 30 minutes to 1-1/2 hours usually occurring 5 or 6 times per day, previous patient of Dr. Erling Cruz.  Her headaches are always on the left side. She also has a history of complex partial Seizures, last seizure was 2007.   She had an MRI studies of her brain 05/2009, 09/15/09, and 07/02/12 showing stable en plaque meningiomas, one over the left lateral convexity and the other in the middle of the anterior fossa.  There is improvement in the frequency and severity of her headaches with the use of Indomethacin . It cuts down her headaches approximately 75%. Indomethacin is associated with elevated creatinine. She is followed by Dr.Webb. Her creatinine runs 1.19. Because of this she discontinued her indomethacin .  She tried Botox by Dr. Tyron Russell and [place]elman without benefit . Nasal oxygen increases the speed with which her headache pain relief occurs. She developed headaches in February 2012 and went to the emergency room. She went to the emergency room 10/19/213 with headaches. She continued to have headaches after her ER visit. They are worse at night than during the day. They are not associated with eyelid droop, nasal stuffiness, or tearing. She has gained weight on divalproex sodium.   UPDATE April 8th 2014:  She continues to have left side headaches, about five-six times a day, lasting less than one hour. She has stopped working because of headaches, She is alternating between tylenol, tizanidin, flexeril, Reglan, oxygen during headaches.  Imitrex does not work, she worries about its over use too. Overall , she is happy about current treatment  UPDATE Oct 02 2013:  She complains of left side headache, close to her left ear, done to left neck,  every day, 5-6 times a day, lasting 45 minutes, She is taking flexeril, tizanidin, reglan for headaches, she is also taking Depakote, Topamax for headache prevention.    64 year old female returns for followup. She called in the office on 03/24/2014 and was given a prednisone dosepak by Dr. [name]. Patient says she was told to stop her preventives while she was on the dosepak. She has not gotten relief from the dosepak. I made her aware that she was not supposed to stop the preventives she takes the dose  pack  in addition. She brings in her headache diary, she has 2 severe headaches in February, 7 severe headaches in March, that is when she called in for prednisone which has been helpful in the past. She claimed her headache was a 10 when I first walked into the room today but after she was set up to receive IV infusion suddenly her headache went away. She is on multiple preventives to include Depakote, Flexeril, magnesium, nortriptyline Zanaflex, Topamax, and verapamil She takes Zomig acutely, she also has oxygen to take for her headaches which she did not try this morning before coming to the office. She returns for reevaluation  UPDATE Jan 20th 2016: She continues to complain of left side headaches, constant, 1-2 out of 10, but few times each day, it would exacerbated to a moderate headaches, especially at the end of the day,  She continued to be on polypharmacy, this including Depakote ER 500 mg every night, Topamax 100 mg twice a day, tizanidine 4 mg 2 tablets as needed, magnesium oxide, verapamil, Relpax works  well for her, but she is taking it almost daily basis  REVIEW OF SYSTEMS: Full 14 system review of systems performed and notable only for those listed, all others are neg:    ALLERGIES: Allergies  Allergen Reactions  . Aspirin Other (See Comments) and Nausea Only    Could cause ulcers Could cause ulcers Could cause ulcers    HOME MEDICATIONS: Outpatient Prescriptions Prior to Visit    Medication Sig Dispense Refill  . atorvastatin (LIPITOR) 40 MG tablet Take 40 mg by mouth daily.    . calcium-vitamin D (OSCAL WITH D) 250-125 MG-UNIT per tablet Take 1 tablet by mouth daily.    . divalproex (DEPAKOTE ER) 500 MG 24 hr tablet 1 tablet the morning, 2 tablets in the evening 90 tablet 1  . eletriptan (RELPAX) 40 MG tablet Take 1 tablet (40 mg total) by mouth as needed for migraine or headache. May repeat in 2 hours if headache persists or recurs. 15 tablet 1  . ferrous sulfate 325 (65 FE) MG tablet Take 325 mg by mouth daily with breakfast.    . HYDROcodone-acetaminophen (NORCO) 5-325 MG per tablet Take 1-2 tablets by mouth every 6 (six) hours as needed for moderate pain or severe pain. 10 tablet 0  . Linaclotide (LINZESS) 290 MCG CAPS capsule Take 290 mcg by mouth daily.    . magnesium oxide (MAG-OX) 400 MG tablet Take 400 mg by mouth daily.     . metoCLOPramide (REGLAN) 5 MG tablet Take 5 mg by mouth 3 (three) times daily as needed for nausea.    . Multiple Vitamin (MULTIVITAMIN WITH MINERALS) TABS Take 1 tablet by mouth daily.    Marland Kitchen olmesartan (BENICAR) 40 MG tablet Take 40 mg by mouth daily.    Marland Kitchen tiZANidine (ZANAFLEX) 4 MG tablet Take 4 mg by mouth 2 (two) times daily as needed for muscle spasms.    Marland Kitchen topiramate (TOPAMAX) 100 MG tablet Take 1 tablet (100 mg total) by mouth 2 (two) times daily. 60 tablet 11  . verapamil (CALAN-SR) 240 MG CR tablet Take 240 mg by mouth at bedtime.    . vitamin B-12 (CYANOCOBALAMIN) 1000 MCG tablet Take 1,000 mcg by mouth daily.     No facility-administered medications prior to visit.    PAST MEDICAL HISTORY: Past Medical History  Diagnosis Date  . Migraine   . Seizures   . High cholesterol   . Diabetes mellitus without complication     PAST SURGICAL HISTORY: Past Surgical History  Procedure Laterality Date  . Abdominal hysterectomy      FAMILY HISTORY: Family History  Problem Relation Age of Onset  . Lung cancer Mother   .  Hypertension Other   . Kidney failure Father     SOCIAL HISTORY: History   Social History  . Marital Status: Married    Spouse Name: Elenore Rota    Number of Children: 3  . Years of Education: college   Occupational History  .      does not work   Social History Main Topics  . Smoking status: Never Smoker   . Smokeless tobacco: Never Used  . Alcohol Use: No  . Drug Use: No  . Sexual Activity: Not on file   Other Topics Concern  . Not on file   Social History Narrative   Patient lives at home with her husband Elenore Rota). Patient was a homemaker.   Right handed.   College education.   Caffeine- One cup of coffee daily.  Patient has three children.     PHYSICAL EXAM  Filed Vitals:   01/12/15 1104  BP: 122/69  Pulse: 79  Height: 5\' 1"  (1.549 m)  Weight: 118 lb (53.524 kg)   Body mass index is 22.31 kg/(m^2).  Generalized: Well developed, in no acute distress  Head: normocephalic and atraumatic,. Oropharynx benign  Neck: Supple, no carotid bruits  Cardiac: Regular rate rhythm, no murmur  Musculoskeletal: No deformity   Neurological examination   Mentation: Alert oriented to time, place, history taking. Follows all commands speech and language fluent, depressed looking elderly female  Cranial nerve II-XII: Pupils were equal round reactive to light extraocular movements were full, visual field were full on confrontational test. Facial sensation and strength were normal. hearing was intact to finger rubbing bilaterally. Uvula tongue midline. head turning and shoulder shrug were normal and symmetric.Tongue protrusion into cheek strength was normal. Motor: normal bulk and tone, full strength in the BUE, BLE, fine finger movements normal, no pronator drift. No focal weakness Sensory: normal and symmetric to light touch, pinprick, and  vibration  Coordination: finger-nose-finger, heel-to-shin bilaterally, no dysmetria Reflexes: Brachioradialis 2/2, biceps 2/2, triceps 2/2,  patellar 2/2, Achilles 2/2, plantar responses were flexor bilaterally. Gait and Station: Rising up from seated position without assistance, normal stance,  moderate stride, good arm swing, smooth turning, able to perform tiptoe, and heel walking without difficulty. Tandem gait is steady  DIAGNOSTIC DATA (LABS, IMAGING, TESTING) - ASSESSMENT AND PLAN  63 y.o. year old female  has a past medical history of Migraine; left hemicranial headaches with symptoms of eyelid droop nasal stuffiness  and eye tearing , and Seizures;  1, chronic headaches, refill her medications  2. Return to clinic with Hoyle Sauer in 6-9 months  Marcial Pacas. M.D. Ph.D. 01/12/2015, 11:06 AM   [facility] [address], [place], [place] [address] [phone]

## 2015-01-18 DIAGNOSIS — M542 Cervicalgia: Secondary | ICD-10-CM | POA: Diagnosis not present

## 2015-01-20 DIAGNOSIS — M542 Cervicalgia: Secondary | ICD-10-CM | POA: Diagnosis not present

## 2015-01-25 DIAGNOSIS — M542 Cervicalgia: Secondary | ICD-10-CM | POA: Diagnosis not present

## 2015-01-27 DIAGNOSIS — M542 Cervicalgia: Secondary | ICD-10-CM | POA: Diagnosis not present

## 2015-02-01 DIAGNOSIS — R944 Abnormal results of kidney function studies: Secondary | ICD-10-CM | POA: Diagnosis not present

## 2015-02-01 DIAGNOSIS — K469 Unspecified abdominal hernia without obstruction or gangrene: Secondary | ICD-10-CM | POA: Diagnosis not present

## 2015-02-03 DIAGNOSIS — M542 Cervicalgia: Secondary | ICD-10-CM | POA: Diagnosis not present

## 2015-02-04 DIAGNOSIS — D631 Anemia in chronic kidney disease: Secondary | ICD-10-CM | POA: Diagnosis not present

## 2015-02-04 DIAGNOSIS — N183 Chronic kidney disease, stage 3 (moderate): Secondary | ICD-10-CM | POA: Diagnosis not present

## 2015-02-04 DIAGNOSIS — N189 Chronic kidney disease, unspecified: Secondary | ICD-10-CM | POA: Diagnosis not present

## 2015-02-04 DIAGNOSIS — E785 Hyperlipidemia, unspecified: Secondary | ICD-10-CM | POA: Diagnosis not present

## 2015-02-04 DIAGNOSIS — N2581 Secondary hyperparathyroidism of renal origin: Secondary | ICD-10-CM | POA: Diagnosis not present

## 2015-02-18 ENCOUNTER — Other Ambulatory Visit (INDEPENDENT_AMBULATORY_CARE_PROVIDER_SITE_OTHER): Payer: Self-pay | Admitting: Surgery

## 2015-02-18 DIAGNOSIS — K432 Incisional hernia without obstruction or gangrene: Secondary | ICD-10-CM | POA: Diagnosis not present

## 2015-03-08 DIAGNOSIS — G4723 Circadian rhythm sleep disorder, irregular sleep wake type: Secondary | ICD-10-CM | POA: Diagnosis not present

## 2015-03-08 DIAGNOSIS — G44049 Chronic paroxysmal hemicrania, not intractable: Secondary | ICD-10-CM | POA: Diagnosis not present

## 2015-03-08 DIAGNOSIS — G2581 Restless legs syndrome: Secondary | ICD-10-CM | POA: Diagnosis not present

## 2015-04-19 ENCOUNTER — Other Ambulatory Visit: Payer: Self-pay | Admitting: Surgery

## 2015-04-19 DIAGNOSIS — K432 Incisional hernia without obstruction or gangrene: Secondary | ICD-10-CM | POA: Diagnosis not present

## 2015-05-03 ENCOUNTER — Other Ambulatory Visit (HOSPITAL_COMMUNITY): Payer: Self-pay | Admitting: Family Medicine

## 2015-05-03 DIAGNOSIS — Z1231 Encounter for screening mammogram for malignant neoplasm of breast: Secondary | ICD-10-CM

## 2015-05-11 ENCOUNTER — Ambulatory Visit (HOSPITAL_COMMUNITY)
Admission: RE | Admit: 2015-05-11 | Discharge: 2015-05-11 | Disposition: A | Discharge status: Home / Self Care | Payer: Medicare Other | Source: Ambulatory Visit | Attending: Family Medicine | Admitting: Family Medicine

## 2015-05-11 DIAGNOSIS — Z1231 Encounter for screening mammogram for malignant neoplasm of breast: Secondary | ICD-10-CM | POA: Diagnosis present

## 2015-05-18 DIAGNOSIS — G44049 Chronic paroxysmal hemicrania, not intractable: Secondary | ICD-10-CM | POA: Diagnosis not present

## 2015-05-18 DIAGNOSIS — G4723 Circadian rhythm sleep disorder, irregular sleep wake type: Secondary | ICD-10-CM | POA: Diagnosis not present

## 2015-05-18 DIAGNOSIS — G2581 Restless legs syndrome: Secondary | ICD-10-CM | POA: Diagnosis not present

## 2015-06-02 DIAGNOSIS — G43511 Persistent migraine aura without cerebral infarction, intractable, with status migrainosus: Secondary | ICD-10-CM | POA: Diagnosis not present

## 2015-06-02 DIAGNOSIS — E119 Type 2 diabetes mellitus without complications: Secondary | ICD-10-CM | POA: Diagnosis not present

## 2015-06-02 DIAGNOSIS — N183 Chronic kidney disease, stage 3 (moderate): Secondary | ICD-10-CM | POA: Diagnosis not present

## 2015-06-02 DIAGNOSIS — E785 Hyperlipidemia, unspecified: Secondary | ICD-10-CM | POA: Diagnosis not present

## 2015-06-02 DIAGNOSIS — I10 Essential (primary) hypertension: Secondary | ICD-10-CM | POA: Diagnosis not present

## 2015-06-08 ENCOUNTER — Encounter (HOSPITAL_COMMUNITY)
Admission: RE | Admit: 2015-06-08 | Discharge: 2015-06-08 | Disposition: A | Discharge status: Home / Self Care | Payer: Medicare Other | Source: Ambulatory Visit | Attending: Surgery | Admitting: Surgery

## 2015-06-08 ENCOUNTER — Encounter (HOSPITAL_COMMUNITY): Payer: Self-pay

## 2015-06-08 DIAGNOSIS — R569 Unspecified convulsions: Secondary | ICD-10-CM | POA: Diagnosis not present

## 2015-06-08 DIAGNOSIS — E119 Type 2 diabetes mellitus without complications: Secondary | ICD-10-CM | POA: Diagnosis not present

## 2015-06-08 DIAGNOSIS — Z79891 Long term (current) use of opiate analgesic: Secondary | ICD-10-CM | POA: Diagnosis not present

## 2015-06-08 DIAGNOSIS — Z90710 Acquired absence of both cervix and uterus: Secondary | ICD-10-CM | POA: Diagnosis not present

## 2015-06-08 DIAGNOSIS — Z79899 Other long term (current) drug therapy: Secondary | ICD-10-CM | POA: Diagnosis not present

## 2015-06-08 DIAGNOSIS — I10 Essential (primary) hypertension: Secondary | ICD-10-CM | POA: Diagnosis not present

## 2015-06-08 DIAGNOSIS — K432 Incisional hernia without obstruction or gangrene: Secondary | ICD-10-CM | POA: Diagnosis not present

## 2015-06-08 HISTORY — DX: Essential (primary) hypertension: I10

## 2015-06-08 LAB — CBC
HCT: 38 % (ref 36.0–46.0)
Hemoglobin: 12.2 g/dL (ref 12.0–15.0)
MCH: 28.8 pg (ref 26.0–34.0)
MCHC: 32.1 g/dL (ref 30.0–36.0)
MCV: 89.6 fL (ref 78.0–100.0)
Platelets: 262 10*3/uL (ref 150–400)
RBC: 4.24 MIL/uL (ref 3.87–5.11)
RDW: 13.5 % (ref 11.5–15.5)
WBC: 8 10*3/uL (ref 4.0–10.5)

## 2015-06-08 LAB — BASIC METABOLIC PANEL
Anion gap: 10 (ref 5–15)
BUN: 24 mg/dL — ABNORMAL HIGH (ref 6–20)
CO2: 26 mmol/L (ref 22–32)
Calcium: 9.6 mg/dL (ref 8.9–10.3)
Chloride: 104 mmol/L (ref 101–111)
Creatinine, Ser: 1.21 mg/dL — ABNORMAL HIGH (ref 0.44–1.00)
GFR calc Af Amer: 53 mL/min — ABNORMAL LOW (ref >60)
GFR calc non Af Amer: 46 mL/min — ABNORMAL LOW (ref >60)
Glucose, Bld: 78 mg/dL (ref 65–99)
Potassium: 4.4 mmol/L (ref 3.5–5.1)
Sodium: 140 mmol/L (ref 135–145)

## 2015-06-08 LAB — GLUCOSE, CAPILLARY: Glucose-Capillary: 73 mg/dL (ref 65–99)

## 2015-06-08 NOTE — Pre-Procedure Instructions (Signed)
[name]  06/08/2015      [id number] [name], [place] - [place] [address]  [place] [address] Phone: [phone] Fax: [phone]  [id number] - [address], [place]. [place] [address] Phone: [phone] Fax: [phone]  [place], [place] [place] [address] Phone: [phone] Fax: [phone]    Your procedure is scheduled on : June 23rd, Thursday   Report to [place] at 5:30 A.M.   Call this number if you have problems the morning of surgery:  [phone]   Remember:  Do not eat food or drink liquids after midnight Wednesday.   Take these medicines the morning of surgery with A SIP OF WATER - Depakote, Reglan or Zofran, Tramadol   Do not wear jewelry, make-up or nail polish.  Do not wear lotions, powders, or perfumes.   Do not shave underarms & legs 48 hours prior to surgery.     Do not bring valuables to the hospital.  [facility] is not responsible for any belongings or valuables.  Contacts, dentures or bridgework may not be worn into surgery.  Leave your suitcase in the car.  After surgery it may be brought to your room.  Patients discharged the day of surgery will not be allowed to drive home.   Name and phone number of your driver:     Special instructions: : Preparing for Surgery instruction sheet.  Please read over the following fact sheets that you were given. Pain Booklet and Surgical Site Infection Prevention

## 2015-06-08 NOTE — Progress Notes (Addendum)
Denies any heart problems, has never seen a cardio.  Has migraines, which she takes depakote & topamax for.  Dr. Gordy Levan is her headache specialist ( in [facility]) Dr. [name] is her neurologist Dr. Alben Spittle is her PCP. Blood glucose was 73 -  She stated she feels fine.  DA

## 2015-06-09 LAB — HEMOGLOBIN A1C
Hgb A1c MFr Bld: 6.3 % — ABNORMAL HIGH (ref 4.8–5.6)
Mean Plasma Glucose: 134 mg/dL

## 2015-06-15 DIAGNOSIS — R569 Unspecified convulsions: Secondary | ICD-10-CM | POA: Diagnosis not present

## 2015-06-15 DIAGNOSIS — Z90710 Acquired absence of both cervix and uterus: Secondary | ICD-10-CM | POA: Diagnosis not present

## 2015-06-15 DIAGNOSIS — E119 Type 2 diabetes mellitus without complications: Secondary | ICD-10-CM | POA: Diagnosis not present

## 2015-06-15 DIAGNOSIS — Z79891 Long term (current) use of opiate analgesic: Secondary | ICD-10-CM | POA: Diagnosis not present

## 2015-06-15 DIAGNOSIS — I10 Essential (primary) hypertension: Secondary | ICD-10-CM | POA: Diagnosis not present

## 2015-06-15 DIAGNOSIS — K432 Incisional hernia without obstruction or gangrene: Secondary | ICD-10-CM | POA: Diagnosis not present

## 2015-06-15 MED ORDER — CEFAZOLIN SODIUM-DEXTROSE 2-3 GM-% IV SOLR
2.0000 g | INTRAVENOUS | Status: AC
  Administered 2015-06-16: 2 g via INTRAVENOUS
  Filled 2015-06-15: qty 50

## 2015-06-15 NOTE — H&P (Signed)
[facility] L. [facility]  Location: [facility] Patient #: [id number] DOB: 17-Oct-1951 Married / Language: English / Race: Black or African American Female  History of Present Illness ([name] A. [name] MD;  Patient words: [name].  The patient is a 64 year old female who presents with an incisional hernia. This is a pleasant female referred by Dr. Carol Ada for an incisional hernia. She has noticed a hernia for at least 2 years. She reports no discomfort. It is just getting larger. She is otherwise without complaints.   Past Surgical History ([name], CMA Hysterectomy (not due to cancer) - Complete Hysterectomy (not due to cancer) - Partial  Allergies ([name], CMA Aspirin *ANALGESICS - NonNarcotic*  Medication History ([name], CMA; Divalproex Sodium ER (250MG  Tablet ER 24HR, Oral) Active. HydrOXYzine HCl (10MG  Tablet, Oral) Active. Ketorolac Tromethamine (10MG  Tablet, Oral) Active. Cyclobenzaprine HCl (5MG  Tablet, Oral) Active. Fluocinonide (0.05% Gel, External) Active. TraMADol HCl ER (100MG  Tablet ER 24HR, Oral as needed) Active. Oxycodone-Acetaminophen (5-325MG  Tablet, Oral as needed) Active. Medications Reconciled  Social History ([name], CMA Caffeine use Coffee. No alcohol use No drug use Tobacco use Never smoker.  Family History [name], [place];  Alcohol Abuse Father. Cancer Mother, Sister. Kidney Disease Father.  Review of Systems ([name];  General Present- Weight Loss. Not Present- Appetite Loss, Chills, Fatigue, Fever, Night Sweats and Weight Gain. Skin Not Present- Change in Wart/Mole, Dryness, Hives, Jaundice, New Lesions, Non-Healing Wounds, Rash and Ulcer. HEENT Present- Wears glasses/contact lenses. Not Present- Earache, Hearing Loss, Hoarseness, Nose Bleed, Oral Ulcers, Ringing in the Ears, Seasonal Allergies, Sinus Pain, Sore Throat, Visual Disturbances and Yellow Eyes. Respiratory Not Present- Bloody  sputum, Chronic Cough, Difficulty Breathing, Snoring and Wheezing. Breast Not Present- Breast Mass, Breast Pain, Nipple Discharge and Skin Changes. Cardiovascular Not Present- Chest Pain, Difficulty Breathing Lying Down, Leg Cramps, Palpitations, Rapid Heart Rate, Shortness of Breath and Swelling of Extremities. Gastrointestinal Present- Constipation. Not Present- Abdominal Pain, Bloating, Bloody Stool, Change in Bowel Habits, Chronic diarrhea, Difficulty Swallowing, Excessive gas, Gets full quickly at meals, Hemorrhoids, Indigestion, Nausea, Rectal Pain and Vomiting. Female Genitourinary Present- Nocturia. Not Present- Frequency, Painful Urination, Pelvic Pain and Urgency. Musculoskeletal Not Present- Back Pain, Joint Pain, Joint Stiffness, Muscle Pain, Muscle Weakness and Swelling of Extremities. Neurological Present- Headaches and Seizures. Not Present- Decreased Memory, Fainting, Numbness, Tingling, Tremor, Trouble walking and Weakness. Psychiatric Not Present- Anxiety, Bipolar, Change in Sleep Pattern, Depression, Fearful and Frequent crying. Endocrine Not Present- Cold Intolerance, Excessive Hunger, Hair Changes, Heat Intolerance, Hot flashes and New Diabetes. Hematology Not Present- Easy Bruising, Excessive bleeding, Gland problems, HIV and Persistent Infections.   Vitals ([name] CMA;  02/18/2015 11:12 AM Weight: 120 lb Height: 60in Body Surface Area: 1.52 m Body Mass Index: 23.44 kg/m Temp.: 99F(Temporal)  Pulse: 79 (Regular)  BP: 126/78 (Sitting, Left Arm, Standard)    Physical Exam ([name] A. [name] MD; General Mental Status-Alert. General Appearance-Consistent with stated age. Hydration-Well hydrated. Voice-Normal.  Head and Neck Head-normocephalic, atraumatic with no lesions or palpable masses. Trachea-midline.  Eye Eyeball - Bilateral-Extraocular movements intact. Sclera/Conjunctiva - Bilateral-No scleral icterus.  Chest and Lung  Exam Chest and lung exam reveals -quiet, even and easy respiratory effort with no use of accessory muscles and on auscultation, normal breath sounds, no adventitious sounds and normal vocal resonance. Inspection Chest Wall - Normal. Back - normal.  Cardiovascular Cardiovascular examination reveals -normal heart sounds, regular rate and rhythm with no murmurs and normal pedal pulses bilaterally.  Abdomen Inspection Skin - Scar - no surgical scars. Hernias - Incisional - Reducible. Note: There is a moderate sized incisional hernia at her midline incision below the umbilicus and moving to the left. Palpation/Percussion Palpation and Percussion of the abdomen reveal - Soft, Non Tender, No Rebound tenderness, No Rigidity (guarding) and No hepatosplenomegaly. Auscultation Auscultation of the abdomen reveals - Bowel sounds normal.  Neurologic Neurologic evaluation reveals -alert and oriented x 3 with no impairment of recent or remote memory. Mental Status-Normal.  Musculoskeletal Normal Exam - Left-Upper Extremity Strength Normal and Lower Extremity Strength Normal. Normal Exam - Right-Upper Extremity Strength Normal, Lower Extremity Weakness.    Assessment & Plan (Clemons Salvucci A. [name] MD;  [name] HERNIA [phone])  Impression: I discussed the diagnosis with her in detail. I discussed laparoscopic versus open incisional hernia repair with mesh. I will recommend laparoscopic repair. I discussed the risks of surgery which includes but is not limited to bleeding, infection, injury to surrounding structures, injury to the bowel, the need to convert to an open procedure, recurrence, DVT, cardiopulmonary issues, etc.  She understands and wishes to proceed.

## 2015-06-16 ENCOUNTER — Encounter (HOSPITAL_COMMUNITY): Payer: Self-pay | Admitting: *Deleted

## 2015-06-16 ENCOUNTER — Ambulatory Visit (HOSPITAL_COMMUNITY): Payer: Medicare Other | Admitting: Anesthesiology

## 2015-06-16 ENCOUNTER — Observation Stay (HOSPITAL_COMMUNITY)
Admission: RE | Admit: 2015-06-16 | Discharge: 2015-06-18 | Disposition: A | Discharge status: Home / Self Care | Payer: Medicare Other | Source: Ambulatory Visit | Attending: Surgery | Admitting: Surgery

## 2015-06-16 ENCOUNTER — Encounter (HOSPITAL_COMMUNITY)
Admission: RE | Disposition: A | Discharge status: Home / Self Care | Payer: Self-pay | Source: Ambulatory Visit | Attending: Surgery

## 2015-06-16 DIAGNOSIS — E119 Type 2 diabetes mellitus without complications: Secondary | ICD-10-CM | POA: Insufficient documentation

## 2015-06-16 DIAGNOSIS — Z79899 Other long term (current) drug therapy: Secondary | ICD-10-CM | POA: Insufficient documentation

## 2015-06-16 DIAGNOSIS — R569 Unspecified convulsions: Secondary | ICD-10-CM | POA: Diagnosis not present

## 2015-06-16 DIAGNOSIS — I10 Essential (primary) hypertension: Secondary | ICD-10-CM | POA: Diagnosis not present

## 2015-06-16 DIAGNOSIS — Z90710 Acquired absence of both cervix and uterus: Secondary | ICD-10-CM | POA: Diagnosis not present

## 2015-06-16 DIAGNOSIS — K432 Incisional hernia without obstruction or gangrene: Secondary | ICD-10-CM | POA: Diagnosis not present

## 2015-06-16 DIAGNOSIS — Z79891 Long term (current) use of opiate analgesic: Secondary | ICD-10-CM | POA: Insufficient documentation

## 2015-06-16 HISTORY — PX: INCISIONAL HERNIA REPAIR: SHX193

## 2015-06-16 HISTORY — DX: Generalized idiopathic epilepsy and epileptic syndromes, not intractable, without status epilepticus: G40.309

## 2015-06-16 HISTORY — PX: INSERTION OF MESH: SHX5868

## 2015-06-16 HISTORY — DX: Other generalized epilepsy and epileptic syndromes, not intractable, without status epilepticus: G40.409

## 2015-06-16 LAB — GLUCOSE, CAPILLARY: Glucose-Capillary: 77 mg/dL (ref 65–99)

## 2015-06-16 SURGERY — REPAIR, HERNIA, INCISIONAL, LAPAROSCOPIC
Anesthesia: General | Site: Abdomen

## 2015-06-16 MED ORDER — MAGNESIUM OXIDE 400 (241.3 MG) MG PO TABS
400.0000 mg | ORAL_TABLET | Freq: Two times a day (BID) | ORAL | Status: DC
  Administered 2015-06-16 – 2015-06-17 (×4): 400 mg via ORAL
  Filled 2015-06-16 (×6): qty 1

## 2015-06-16 MED ORDER — ENSURE ENLIVE PO LIQD: 237.0000 mL | Freq: Two times a day (BID) | ORAL | Status: DC

## 2015-06-16 MED ORDER — DEXAMETHASONE SODIUM PHOSPHATE 4 MG/ML IJ SOLN
INTRAMUSCULAR | Status: DC | PRN
  Administered 2015-06-16: 4 mg via INTRAVENOUS

## 2015-06-16 MED ORDER — ROCURONIUM BROMIDE 100 MG/10ML IV SOLN
INTRAVENOUS | Status: DC | PRN
  Administered 2015-06-16: 30 mg via INTRAVENOUS

## 2015-06-16 MED ORDER — ONDANSETRON HCL 4 MG/2ML IJ SOLN: 4.0000 mg | Freq: Four times a day (QID) | INTRAMUSCULAR | Status: DC | PRN

## 2015-06-16 MED ORDER — EPHEDRINE SULFATE 50 MG/ML IJ SOLN
INTRAMUSCULAR | Status: AC
  Filled 2015-06-16: qty 1

## 2015-06-16 MED ORDER — ONDANSETRON HCL 4 MG/2ML IJ SOLN
INTRAMUSCULAR | Status: DC | PRN
  Administered 2015-06-16: 4 mg via INTRAVENOUS

## 2015-06-16 MED ORDER — VERAPAMIL HCL ER 180 MG PO TBCR
360.0000 mg | EXTENDED_RELEASE_TABLET | Freq: Every day | ORAL | Status: DC
  Administered 2015-06-17: 360 mg via ORAL
  Filled 2015-06-16 (×3): qty 2

## 2015-06-16 MED ORDER — LACTATED RINGERS IV SOLN
INTRAVENOUS | Status: DC | PRN
  Administered 2015-06-16: 07:00:00 via INTRAVENOUS

## 2015-06-16 MED ORDER — DIVALPROEX SODIUM ER 500 MG PO TB24
500.0000 mg | ORAL_TABLET | Freq: Every day | ORAL | Status: DC
  Administered 2015-06-17: 500 mg via ORAL
  Filled 2015-06-16: qty 1

## 2015-06-16 MED ORDER — ARTIFICIAL TEARS OP OINT
TOPICAL_OINTMENT | OPHTHALMIC | Status: AC
  Filled 2015-06-16: qty 7

## 2015-06-16 MED ORDER — TOPIRAMATE 100 MG PO TABS
100.0000 mg | ORAL_TABLET | Freq: Two times a day (BID) | ORAL | Status: DC
Start: 2015-06-16 — End: 2015-06-18
  Administered 2015-06-16 – 2015-06-17 (×4): 100 mg via ORAL
  Filled 2015-06-16 (×7): qty 1

## 2015-06-16 MED ORDER — PROPOFOL 10 MG/ML IV BOLUS
INTRAVENOUS | Status: DC | PRN
  Administered 2015-06-16: 110 mg via INTRAVENOUS

## 2015-06-16 MED ORDER — MIDAZOLAM HCL 2 MG/2ML IJ SOLN
INTRAMUSCULAR | Status: AC
  Filled 2015-06-16: qty 2

## 2015-06-16 MED ORDER — HYDROMORPHONE HCL 1 MG/ML IJ SOLN
INTRAMUSCULAR | Status: AC
  Filled 2015-06-16: qty 1

## 2015-06-16 MED ORDER — SUGAMMADEX SODIUM 200 MG/2ML IV SOLN: INTRAVENOUS | Status: DC | PRN

## 2015-06-16 MED ORDER — MIDAZOLAM HCL 5 MG/5ML IJ SOLN
INTRAMUSCULAR | Status: DC | PRN
  Administered 2015-06-16: 1 mg via INTRAVENOUS

## 2015-06-16 MED ORDER — SODIUM CHLORIDE 0.9 % IR SOLN
Status: DC | PRN
  Administered 2015-06-16: 1000 mL

## 2015-06-16 MED ORDER — HYDROCODONE-ACETAMINOPHEN 5-325 MG PO TABS
ORAL_TABLET | ORAL | Status: AC
  Filled 2015-06-16: qty 2

## 2015-06-16 MED ORDER — PHENYLEPHRINE HCL 10 MG/ML IJ SOLN
INTRAMUSCULAR | Status: DC | PRN
  Administered 2015-06-16: 40 ug via INTRAVENOUS

## 2015-06-16 MED ORDER — GABAPENTIN 300 MG PO CAPS
300.0000 mg | ORAL_CAPSULE | Freq: Two times a day (BID) | ORAL | Status: DC
  Administered 2015-06-17 (×2): 300 mg via ORAL
  Filled 2015-06-16 (×2): qty 1

## 2015-06-16 MED ORDER — LIDOCAINE HCL (CARDIAC) 20 MG/ML IV SOLN
INTRAVENOUS | Status: AC
  Filled 2015-06-16: qty 5

## 2015-06-16 MED ORDER — MORPHINE SULFATE 2 MG/ML IJ SOLN
1.0000 mg | INTRAMUSCULAR | Status: DC | PRN
  Administered 2015-06-16 – 2015-06-17 (×2): 2 mg via INTRAVENOUS
  Filled 2015-06-16 (×2): qty 1

## 2015-06-16 MED ORDER — BUPIVACAINE-EPINEPHRINE 0.25% -1:200000 IJ SOLN
INTRAMUSCULAR | Status: DC | PRN
  Administered 2015-06-16: 30 mL

## 2015-06-16 MED ORDER — ROCURONIUM BROMIDE 50 MG/5ML IV SOLN
INTRAVENOUS | Status: AC
  Filled 2015-06-16: qty 1

## 2015-06-16 MED ORDER — ENOXAPARIN SODIUM 40 MG/0.4ML ~~LOC~~ SOLN
40.0000 mg | SUBCUTANEOUS | Status: DC
  Administered 2015-06-17 – 2015-06-18 (×2): 40 mg via SUBCUTANEOUS
  Filled 2015-06-16 (×2): qty 0.4

## 2015-06-16 MED ORDER — NEOSTIGMINE METHYLSULFATE 10 MG/10ML IV SOLN
INTRAVENOUS | Status: DC | PRN
  Administered 2015-06-16: 3 mg via INTRAVENOUS
  Administered 2015-06-16: 1 mg via INTRAVENOUS

## 2015-06-16 MED ORDER — IRBESARTAN 75 MG PO TABS
37.5000 mg | ORAL_TABLET | Freq: Every day | ORAL | Status: DC
  Administered 2015-06-17: 37.5 mg via ORAL
  Filled 2015-06-16: qty 1

## 2015-06-16 MED ORDER — ONDANSETRON HCL 4 MG PO TABS: 4.0000 mg | ORAL_TABLET | Freq: Four times a day (QID) | ORAL | Status: DC | PRN

## 2015-06-16 MED ORDER — HYDROCODONE-ACETAMINOPHEN 5-325 MG PO TABS
1.0000 | ORAL_TABLET | ORAL | Status: DC | PRN
  Administered 2015-06-16 (×3): 2 via ORAL
  Administered 2015-06-17: 1 via ORAL
  Administered 2015-06-17 – 2015-06-18 (×2): 2 via ORAL
  Filled 2015-06-16: qty 1
  Filled 2015-06-16: qty 2
  Filled 2015-06-16: qty 1
  Filled 2015-06-16 (×3): qty 2
  Filled 2015-06-16: qty 1

## 2015-06-16 MED ORDER — SUGAMMADEX SODIUM 200 MG/2ML IV SOLN
INTRAVENOUS | Status: AC
  Filled 2015-06-16: qty 2

## 2015-06-16 MED ORDER — LINACLOTIDE 290 MCG PO CAPS
290.0000 ug | ORAL_CAPSULE | Freq: Every day | ORAL | Status: DC
  Administered 2015-06-17: 290 ug via ORAL
  Filled 2015-06-16 (×3): qty 1

## 2015-06-16 MED ORDER — ARTIFICIAL TEARS OP OINT
TOPICAL_OINTMENT | OPHTHALMIC | Status: DC | PRN
  Administered 2015-06-16: 1 via OPHTHALMIC

## 2015-06-16 MED ORDER — FENTANYL CITRATE (PF) 100 MCG/2ML IJ SOLN
INTRAMUSCULAR | Status: DC | PRN
  Administered 2015-06-16: 25 ug via INTRAVENOUS
  Administered 2015-06-16: 50 ug via INTRAVENOUS
  Administered 2015-06-16: 25 ug via INTRAVENOUS
  Administered 2015-06-16 (×2): 50 ug via INTRAVENOUS

## 2015-06-16 MED ORDER — ACETAMINOPHEN 10 MG/ML IV SOLN
1000.0000 mg | Freq: Once | INTRAVENOUS | Status: AC
  Administered 2015-06-16: 1000 mg via INTRAVENOUS

## 2015-06-16 MED ORDER — GLYCOPYRROLATE 0.2 MG/ML IJ SOLN
INTRAMUSCULAR | Status: AC
  Filled 2015-06-16: qty 3

## 2015-06-16 MED ORDER — HYDROMORPHONE HCL 1 MG/ML IJ SOLN
0.2500 mg | INTRAMUSCULAR | Status: DC | PRN
  Administered 2015-06-16: 1 mg via INTRAVENOUS
  Administered 2015-06-16 (×2): 0.5 mg via INTRAVENOUS

## 2015-06-16 MED ORDER — ACETAMINOPHEN 10 MG/ML IV SOLN
INTRAVENOUS | Status: AC
  Administered 2015-06-16: 1000 mg via INTRAVENOUS
  Filled 2015-06-16: qty 100

## 2015-06-16 MED ORDER — LIDOCAINE HCL (CARDIAC) 20 MG/ML IV SOLN
INTRAVENOUS | Status: DC | PRN
  Administered 2015-06-16: 40 mg via INTRAVENOUS

## 2015-06-16 MED ORDER — ONDANSETRON HCL 4 MG/2ML IJ SOLN
INTRAMUSCULAR | Status: AC
  Filled 2015-06-16: qty 2

## 2015-06-16 MED ORDER — PROPOFOL 10 MG/ML IV BOLUS
INTRAVENOUS | Status: AC
  Filled 2015-06-16: qty 20

## 2015-06-16 MED ORDER — FENTANYL CITRATE (PF) 250 MCG/5ML IJ SOLN
INTRAMUSCULAR | Status: AC
  Filled 2015-06-16: qty 5

## 2015-06-16 MED ORDER — ATORVASTATIN CALCIUM 40 MG PO TABS
40.0000 mg | ORAL_TABLET | Freq: Every day | ORAL | Status: DC
  Administered 2015-06-17: 40 mg via ORAL
  Filled 2015-06-16: qty 1

## 2015-06-16 MED ORDER — GABAPENTIN ENACARBIL ER 600 MG PO TBCR: 600.0000 mg | EXTENDED_RELEASE_TABLET | Freq: Every evening | ORAL | Status: DC

## 2015-06-16 MED ORDER — POTASSIUM CHLORIDE IN NACL 20-0.9 MEQ/L-% IV SOLN
INTRAVENOUS | Status: DC
  Administered 2015-06-16: 15:00:00 via INTRAVENOUS
  Filled 2015-06-16 (×2): qty 1000

## 2015-06-16 MED ORDER — BUPIVACAINE-EPINEPHRINE (PF) 0.25% -1:200000 IJ SOLN
INTRAMUSCULAR | Status: AC
  Filled 2015-06-16: qty 30

## 2015-06-16 MED ORDER — GLYCOPYRROLATE 0.2 MG/ML IJ SOLN
INTRAMUSCULAR | Status: DC | PRN
  Administered 2015-06-16: 0.2 mg via INTRAVENOUS
  Administered 2015-06-16: 0.4 mg via INTRAVENOUS

## 2015-06-16 MED ORDER — DIVALPROEX SODIUM ER 500 MG PO TB24
1000.0000 mg | ORAL_TABLET | Freq: Every day | ORAL | Status: DC
  Administered 2015-06-17: 1000 mg via ORAL
  Filled 2015-06-16: qty 2

## 2015-06-16 MED ORDER — HYDROXYZINE HCL 10 MG PO TABS
10.0000 mg | ORAL_TABLET | Freq: Three times a day (TID) | ORAL | Status: DC | PRN
  Filled 2015-06-16: qty 1

## 2015-06-16 SURGICAL SUPPLY — 52 items
APPLIER CLIP 5 13 M/L LIGAMAX5 (MISCELLANEOUS)
APPLIER CLIP ROT 10 11.4 M/L (STAPLE)
APR CLP MED LRG 11.4X10 (STAPLE)
APR CLP MED LRG 5 ANG JAW (MISCELLANEOUS)
BLADE SURG ROTATE 9660 (MISCELLANEOUS) IMPLANT
CANISTER SUCTION 2500CC (MISCELLANEOUS) IMPLANT
CHLORAPREP W/TINT 26ML (MISCELLANEOUS) ×2 IMPLANT
CLIP APPLIE 5 13 M/L LIGAMAX5 (MISCELLANEOUS) IMPLANT
CLIP APPLIE ROT 10 11.4 M/L (STAPLE) IMPLANT
COVER SURGICAL LIGHT HANDLE (MISCELLANEOUS) ×2 IMPLANT
DECANTER SPIKE VIAL GLASS SM (MISCELLANEOUS) ×2 IMPLANT
DEVICE SECURE STRAP 25 ABSORB (INSTRUMENTS) ×4 IMPLANT
DEVICE TROCAR PUNCTURE CLOSURE (ENDOMECHANICALS) ×2 IMPLANT
DRAPE LAPAROSCOPIC ABDOMINAL (DRAPES) ×2 IMPLANT
ELECT REM PT RETURN 9FT ADLT (ELECTROSURGICAL) ×2
ELECTRODE REM PT RTRN 9FT ADLT (ELECTROSURGICAL) ×1 IMPLANT
GLOVE BIO SURGEON STRL SZ 6.5 (GLOVE) ×6 IMPLANT
GLOVE BIO SURGEON STRL SZ7 (GLOVE) ×2 IMPLANT
GLOVE BIO SURGEON STRL SZ7.5 (GLOVE) ×2 IMPLANT
GLOVE BIOGEL PI IND STRL 6.5 (GLOVE) ×3 IMPLANT
GLOVE BIOGEL PI IND STRL 7.0 (GLOVE) ×1 IMPLANT
GLOVE BIOGEL PI IND STRL 8 (GLOVE) ×1 IMPLANT
GLOVE BIOGEL PI INDICATOR 6.5 (GLOVE) ×3
GLOVE BIOGEL PI INDICATOR 7.0 (GLOVE) ×1
GLOVE BIOGEL PI INDICATOR 8 (GLOVE) ×1
GLOVE SURG SIGNA 7.5 PF LTX (GLOVE) ×2 IMPLANT
GOWN STRL REUS W/ TWL LRG LVL3 (GOWN DISPOSABLE) ×3 IMPLANT
GOWN STRL REUS W/ TWL XL LVL3 (GOWN DISPOSABLE) ×1 IMPLANT
GOWN STRL REUS W/TWL LRG LVL3 (GOWN DISPOSABLE) ×6
GOWN STRL REUS W/TWL XL LVL3 (GOWN DISPOSABLE) ×2
KIT BASIN OR (CUSTOM PROCEDURE TRAY) ×2 IMPLANT
KIT ROOM TURNOVER OR (KITS) ×2 IMPLANT
LIQUID BAND (GAUZE/BANDAGES/DRESSINGS) ×2 IMPLANT
MARKER SKIN DUAL TIP RULER LAB (MISCELLANEOUS) ×2 IMPLANT
MESH VENTRALIGHT ST 6X8 (Mesh Specialty) ×1 IMPLANT
MESH VENTRLGHT ELLIPSE 8X6XMFL (Mesh Specialty) ×1 IMPLANT
NEEDLE SPNL 22GX3.5 QUINCKE BK (NEEDLE) ×2 IMPLANT
NS IRRIG 1000ML POUR BTL (IV SOLUTION) ×2 IMPLANT
PAD ARMBOARD 7.5X6 YLW CONV (MISCELLANEOUS) ×4 IMPLANT
SCALPEL HARMONIC ACE (MISCELLANEOUS) IMPLANT
SCISSORS LAP 5X35 DISP (ENDOMECHANICALS) ×2 IMPLANT
SET IRRIG TUBING LAPAROSCOPIC (IRRIGATION / IRRIGATOR) IMPLANT
SLEEVE ENDOPATH XCEL 5M (ENDOMECHANICALS) ×2 IMPLANT
SUT MON AB 4-0 PC3 18 (SUTURE) ×2 IMPLANT
SUT NOVA NAB GS-21 0 18 T12 DT (SUTURE) ×2 IMPLANT
TOWEL OR 17X24 6PK STRL BLUE (TOWEL DISPOSABLE) ×2 IMPLANT
TOWEL OR 17X26 10 PK STRL BLUE (TOWEL DISPOSABLE) ×2 IMPLANT
TRAY FOLEY CATH 16FR SILVER (SET/KITS/TRAYS/PACK) IMPLANT
TRAY LAPAROSCOPIC (CUSTOM PROCEDURE TRAY) ×2 IMPLANT
TROCAR XCEL NON-BLD 11X100MML (ENDOMECHANICALS) ×2 IMPLANT
TROCAR XCEL NON-BLD 5MMX100MML (ENDOMECHANICALS) ×2 IMPLANT
TUBING INSUFFLATION (TUBING) ×2 IMPLANT

## 2015-06-16 NOTE — Op Note (Signed)
LAPAROSCOPIC INCISIONAL HERNIA WITH MESH, INSERTION OF MESH  Procedure Note  [name] 06/16/2015   Pre-op Diagnosis: Incisional Hernia     Post-op Diagnosis: same  Procedure(s): LAPAROSCOPIC INCISIONAL HERNIA WITH MESH INSERTION OF MESH  Surgeon(s): Coralie Keens, MD  Anesthesia: General  Staff:  Circulator: Beryle Lathe, RN Scrub Person: Imagene Gurney; Quincy Carnes, RN Circulator Assistant: Quincy Carnes, RN RN First Assistant: Quincy Carnes, RN  Estimated Blood Loss: Minimal                         Coralie Keens A   Date: 06/16/2015  Time: 8:27 AM

## 2015-06-16 NOTE — Transfer of Care (Signed)
Immediate Anesthesia Transfer of Care Note  Patient: [name]  Procedure(s) Performed: Procedure(s): LAPAROSCOPIC INCISIONAL HERNIA WITH MESH (N/A) INSERTION OF MESH (N/A)  Patient Location: PACU  Anesthesia Type:General  Level of Consciousness: awake, alert  and oriented  Airway & Oxygen Therapy: Patient Spontanous Breathing and Patient connected to nasal cannula oxygen  Post-op Assessment: Report given to RN and Post -op Vital signs reviewed and stable  Post vital signs: Reviewed and stable  Last Vitals:  Filed Vitals:   06/16/15 0845  BP:   Pulse:   Temp: 36.7 C  Resp:     Complications: No apparent anesthesia complications

## 2015-06-16 NOTE — Anesthesia Preprocedure Evaluation (Addendum)
Anesthesia Evaluation  Patient identified by MRN, date of birth, ID band Patient awake    Reviewed: Allergy & Precautions, H&P , NPO status , Patient's Chart, lab work & pertinent test results  Airway Mallampati: II  TM Distance: >3 FB Neck ROM: Full    Dental no notable dental hx. (+) Chipped, Dental Advisory Given   Pulmonary neg pulmonary ROS,  breath sounds clear to auscultation  Pulmonary exam normal       Cardiovascular hypertension, Pt. on medications Rhythm:Regular Rate:Normal     Neuro/Psych Seizures -, Well Controlled,  negative psych ROS   GI/Hepatic negative GI ROS, Neg liver ROS,   Endo/Other  diabetes  Renal/GU negative Renal ROS  negative genitourinary   Musculoskeletal   Abdominal   Peds  Hematology negative hematology ROS (+)   Anesthesia Other Findings   Reproductive/Obstetrics negative OB ROS                            Anesthesia Physical Anesthesia Plan  ASA: II  Anesthesia Plan: General   Post-op Pain Management:    Induction: Intravenous  Airway Management Planned: Oral ETT  Additional Equipment:   Intra-op Plan:   Post-operative Plan: Extubation in OR  Informed Consent: I have reviewed the patients History and Physical, chart, labs and discussed the procedure including the risks, benefits and alternatives for the proposed anesthesia with the patient or authorized representative who has indicated his/her understanding and acceptance.   Dental advisory given  Plan Discussed with: CRNA  Anesthesia Plan Comments:         Anesthesia Quick Evaluation

## 2015-06-16 NOTE — Interval H&P Note (Signed)
History and Physical Interval Note: no change in H and P  06/16/2015 7:09 AM  [name]  has presented today for surgery, with the diagnosis of Incisional Hernia  The various methods of treatment have been discussed with the patient and family. After consideration of risks, benefits and other options for treatment, the patient has consented to  Procedure(s): [place] (N/A) INSERTION OF MESH (N/A) as a surgical intervention .  The patient's history has been reviewed, patient examined, no change in status, stable for surgery.  I have reviewed the patient's chart and labs.  Questions were answered to the patient's satisfaction.     [name] A

## 2015-06-16 NOTE — Anesthesia Postprocedure Evaluation (Signed)
  Anesthesia Post-op Note  Patient: [name]  Procedure(s) Performed: Procedure(s): LAPAROSCOPIC INCISIONAL HERNIA WITH MESH (N/A) INSERTION OF MESH (N/A)  Patient Location: PACU  Anesthesia Type:General  Level of Consciousness: awake and alert   Airway and Oxygen Therapy: Patient Spontanous Breathing  Post-op Pain: mild  Post-op Assessment: Post-op Vital signs reviewed, Patient's Cardiovascular Status Stable and Respiratory Function Stable  Post-op Vital Signs: Reviewed  Filed Vitals:   06/16/15 1124  BP:   Pulse: 63  Temp:   Resp: 13    Complications: No apparent anesthesia complications

## 2015-06-16 NOTE — Op Note (Signed)
NAMEGABREILLE, DARDIS NO.:  [id number]  MEDICAL RECORD NO.:  [id number]  LOCATION:  [facility]                         FACILITY:  [facility]  PHYSICIAN:  Coralie Keens, M.D. DATE OF BIRTH:  November 03, 1951  DATE OF PROCEDURE:  06/16/2015 DATE OF DISCHARGE:                              OPERATIVE REPORT   PREOPERATIVE DIAGNOSIS:  Incisional hernia.  POSTOPERATIVE DIAGNOSIS:  Incisional hernia.  PROCEDURE:  Laparoscopic incisional hernia repair with mesh.  SURGEON:  Coralie Keens, MD.  ASSISTANT:  Sharyn Dross, RNFA.  ANESTHESIA:  General endotracheal anesthesia and 0.25% Marcaine.  ESTIMATED BLOOD LOSS:  Minimal.  FINDINGS:  The patient was found to have a moderate-sized fascial defect just below the umbilicus as well as one which was small or just above the pubis.  The hernias were repaired with a large piece of 15 cm x 20 cm Ventralight mesh from [employer].  PROCEDURE IN DETAIL:  The patient was brought to the operating room, identified as [name].  She was placed supine on the operating room table and general anesthesia was induced.  Her abdomen was then prepped and draped in usual sterile fashion.  I made a small incision in the patient's left upper quadrant with a scalpel.  I then used a 5-mm trocar and Optiview camera to slowly traverse all layers of the abdominal wall under direct vision to gain entrance into the abdominal cavity. Insufflation of the abdomen was begun.  I inspected the entrance site and saw no evidence of bowel injury.  I placed a 5-mm port in the patient's left lower quadrant and a 10-mm port in the patient's right upper quadrant.  The patient had a moderate size fascial defect just below the umbilicus with omentum tethered around the edges.  I was able to take the omentum down bluntly easily without any bleeding.  I then was able to visualize another hernia defect just above the pubis.  At this point, I measured the size total of the  defect and brought in appropriate piece of mesh onto the field.  I elected to use a 15 cm x 20 cm piece of Ventralight mesh from [employer].  I placed 4 separate stay sutures in the corners of the mesh.  I then rolled the mesh up and soaked it in saline and then inserted through the right upper quadrant 10-mm trocar.  I then unrolled the mesh.  I made 4 separate stab incisions using the suture passer, pulled the sutures up through the 4 separate incisions under direct vision and out of the abdomen, the lowest one was just above the pubis bone at the pelvis.  I then tied all the sutures in place pulling the mesh up taut against the peritoneum.  I then used the Securestrap absorbable tacker to tack the mesh in circumferentially.  Wide, greater than 4 cm overlap appeared to be achieved circumferentially around the fascial defects.  I again evaluated the omentum and saw no evidence of bleeding.  There was also no apparent evidence of bowel injury.  At this point, the abdomen was deflated.  All the trocars were removed under direct vision.  All incisions were then anesthetized with Marcaine and  closed with 4-0 Monocryl subcuticular sutures and skin glue.  The patient tolerated the procedure well.  All the counts were correct at the end of the procedure.  The patient was then extubated in the operating room and taken in stable condition to recovery room.     Coralie Keens, M.D.     DB/MEDQ  D:  06/16/2015  T:  06/16/2015  Job:  [id number]

## 2015-06-16 NOTE — Progress Notes (Signed)
CBG 77. Pt. States she feels fine. States blood sugar is diet controlled and she hasn't checked her sugars in 1 1/2 yrs.

## 2015-06-16 NOTE — Anesthesia Procedure Notes (Signed)
Procedure Name: Intubation Date/Time: 06/16/2015 7:34 AM Performed by: Susa Loffler Pre-anesthesia Checklist: Patient identified, Timeout performed, Emergency Drugs available, Suction available and Patient being monitored Patient Re-evaluated:Patient Re-evaluated prior to inductionOxygen Delivery Method: Circle system utilized Preoxygenation: Pre-oxygenation with 100% oxygen Intubation Type: IV induction Ventilation: Mask ventilation without difficulty Laryngoscope Size: Mac and 3 Grade View: Grade II Tube type: Oral Tube size: 7.0 mm Number of attempts: 1 Airway Equipment and Method: Stylet Placement Confirmation: ETT inserted through vocal cords under direct vision,  positive ETCO2 and breath sounds checked- equal and bilateral Secured at: 21 cm Tube secured with: Tape Dental Injury: Teeth and Oropharynx as per pre-operative assessment

## 2015-06-17 ENCOUNTER — Encounter (HOSPITAL_COMMUNITY): Payer: Self-pay | Admitting: Surgery

## 2015-06-17 DIAGNOSIS — R569 Unspecified convulsions: Secondary | ICD-10-CM | POA: Diagnosis not present

## 2015-06-17 DIAGNOSIS — Z79891 Long term (current) use of opiate analgesic: Secondary | ICD-10-CM | POA: Diagnosis not present

## 2015-06-17 DIAGNOSIS — E119 Type 2 diabetes mellitus without complications: Secondary | ICD-10-CM | POA: Diagnosis not present

## 2015-06-17 DIAGNOSIS — Z90710 Acquired absence of both cervix and uterus: Secondary | ICD-10-CM | POA: Diagnosis not present

## 2015-06-17 DIAGNOSIS — I10 Essential (primary) hypertension: Secondary | ICD-10-CM | POA: Diagnosis not present

## 2015-06-17 DIAGNOSIS — K432 Incisional hernia without obstruction or gangrene: Secondary | ICD-10-CM | POA: Diagnosis not present

## 2015-06-17 MED ORDER — HYDROCODONE-ACETAMINOPHEN 5-325 MG PO TABS: 1.0000 | ORAL_TABLET | ORAL | Status: DC | PRN

## 2015-06-17 MED ORDER — TIZANIDINE HCL 2 MG PO TABS
4.0000 mg | ORAL_TABLET | Freq: Two times a day (BID) | ORAL | Status: DC | PRN
  Administered 2015-06-17: 4 mg via ORAL
  Filled 2015-06-17: qty 2

## 2015-06-17 NOTE — Progress Notes (Signed)
Nutrition Brief Note  Patient identified on the Malnutrition Screening Tool (MST) Report  Wt Readings from Last 15 Encounters:  06/16/15 125 lb 7 oz (56.898 kg)  06/08/15 125 lb 7 oz (56.898 kg)  01/12/15 118 lb (53.524 kg)  10/22/14 130 lb (58.968 kg)  07/08/14 136 lb 8 oz (61.916 kg)  07/03/14 137 lb (62.143 kg)  06/02/14 142 lb 3.2 oz (64.501 kg)  04/02/14 136 lb (61.689 kg)  12/31/13 142 lb (64.411 kg)  10/02/13 146 lb (66.225 kg)  05/16/13 140 lb (63.504 kg)  03/31/13 142 lb (64.411 kg)    Body mass index is 24.5 kg/(m^2). Patient meets criteria for normal based on current BMI. Usual body weight reported ~125 lbs. No significant weight loss.   Current diet order is vegetarian, patient is consuming approximately 100% of meals at this time. Pt reports having a good appetite current and PTA with no other difficulties. Pt currently has Ensure ordered. RD to discontinue. Labs and medications reviewed.   No nutrition interventions warranted at this time. If nutrition issues arise, please consult RD.   Corrin Parker, MS, RD, LDN Pager # [phone] After hours/ weekend pager # [phone]

## 2015-06-17 NOTE — Discharge Instructions (Signed)
[facility] ______[facility] °LAPAROSCOPIC SURGERY: POST OP INSTRUCTIONS °Always review your discharge instruction sheet given to you by the facility where your surgery was performed. °IF YOU HAVE DISABILITY OR FAMILY LEAVE FORMS, YOU MUST BRING THEM TO THE OFFICE FOR PROCESSING.   °DO NOT GIVE THEM TO YOUR DOCTOR. ° °1. A prescription for pain medication may be given to you upon discharge.  Take your pain medication as prescribed, if needed.  If narcotic pain medicine is not needed, then you may take acetaminophen (Tylenol) or ibuprofen (Advil) as needed. °2. Take your usually prescribed medications unless otherwise directed. °3. If you need a refill on your pain medication, please contact your pharmacy.  They will contact our office to request authorization. Prescriptions will not be filled after 5pm or on week-ends. °4. You should follow a light diet the first few days after arrival home, such as soup and crackers, etc.  Be sure to include lots of fluids daily. °5. Most patients will experience some swelling and bruising in the area of the incisions.  Ice packs will help.  Swelling and bruising can take several days to resolve.  °6. It is common to experience some constipation if taking pain medication after surgery.  Increasing fluid intake and taking a stool softener (such as Colace) will usually help or prevent this problem from occurring.  A mild laxative (Milk of Magnesia or Miralax) should be taken according to package instructions if there are no bowel movements after 48 hours. °7. Unless discharge instructions indicate otherwise, you may remove your bandages 24-48 hours after surgery, and you may shower at that time.  You may have steri-strips (small skin tapes) in place directly over the incision.  These strips should be left on the skin for 7-10 days.  If your surgeon used skin glue on the incision, you may shower in 24 hours.  The glue will flake off over the next 2-3 weeks.  Any sutures or  staples will be removed at the office during your follow-up visit. °8. ACTIVITIES:  You may resume regular (light) daily activities beginning the next day--such as daily self-care, walking, climbing stairs--gradually increasing activities as tolerated.  You may have sexual intercourse when it is comfortable.  Refrain from any heavy lifting or straining until approved by your doctor. °a. You may drive when you are no longer taking prescription pain medication, you can comfortably wear a seatbelt, and you can safely maneuver your car and apply brakes. °b. RETURN TO WORK:  __________________________________________________________ °9. You should see your doctor in the office for a follow-up appointment approximately 2-3 weeks after your surgery.  Make sure that you call for this appointment within a day or two after you arrive home to insure a convenient appointment time. °10. OTHER INSTRUCTIONS: __________________________________________________________________________________________________________________________ __________________________________________________________________________________________________________________________ °WHEN TO CALL YOUR DOCTOR: °1. Fever over 101.0 °2. Inability to urinate °3. Continued bleeding from incision. °4. Increased pain, redness, or drainage from the incision. °5. Increasing abdominal pain ° °The clinic staff is available to answer your questions during regular business hours.  Please don’t hesitate to call and ask to speak to one of the nurses for clinical concerns.  If you have a medical emergency, go to the nearest emergency room or call 911.  A surgeon from [facility] is always on call at the hospital. °[address], [address], [place], [place]  [address] ? [address] [address], [place], [place]   [address] °[phone] ? [phone] ? FAX [phone] °Web site:   [email or web address] °

## 2015-06-17 NOTE — Progress Notes (Signed)
1 Day Post-Op  Subjective: Complains of being pretty sore today  Objective: Vital signs in last 24 hours: Temp:  [97.7 F (36.5 C)-98.3 F (36.8 C)] 98.3 F (36.8 C) (06/24 0556) Pulse Rate:  [62-81] 69 (06/24 0556) Resp:  [9-16] 16 (06/24 0556) BP: (91-134)/(43-66) 118/54 mmHg (06/24 0556) SpO2:  [91 %-100 %] 97 % (06/24 0556) Last BM Date: 06/15/15  Intake/Output from previous day: 06/23 0701 - 06/24 0700 In: 3066.3 [P.O.:900; I.V.:2166.3] Out: 100 [Urine:100] Intake/Output this shift: Total I/O In: 1476.3 [P.O.:360; I.V.:1116.3] Out: -   Abdomen soft, appropriately tender  Lab Results:  No results for input(s): WBC, HGB, HCT, PLT in the last 72 hours. BMET No results for input(s): NA, K, CL, CO2, GLUCOSE, BUN, CREATININE, CALCIUM in the last 72 hours. PT/INR No results for input(s): LABPROT, INR in the last 72 hours. ABG No results for input(s): PHART, HCO3 in the last 72 hours.  Invalid input(s): PCO2, PO2  Studies/Results: No results found.  Anti-infectives: Anti-infectives    Start     Dose/Rate Route Frequency Ordered Stop   06/16/15 0630  ceFAZolin (ANCEF) IVPB 2 g/50 mL premix     2 g 100 mL/hr over 30 Minutes Intravenous To [facility] 06/15/15 1349 06/16/15 0735      Assessment/Plan: s/p Procedure(s): LAPAROSCOPIC INCISIONAL HERNIA WITH MESH (N/A) INSERTION OF MESH (N/A)  Continue IV pain meds.  I suspect she won't be ready to go until tomorrow.     [name] A 06/17/2015

## 2015-06-18 DIAGNOSIS — Z79891 Long term (current) use of opiate analgesic: Secondary | ICD-10-CM | POA: Diagnosis not present

## 2015-06-18 DIAGNOSIS — I10 Essential (primary) hypertension: Secondary | ICD-10-CM | POA: Diagnosis not present

## 2015-06-18 DIAGNOSIS — E119 Type 2 diabetes mellitus without complications: Secondary | ICD-10-CM | POA: Diagnosis not present

## 2015-06-18 DIAGNOSIS — Z90710 Acquired absence of both cervix and uterus: Secondary | ICD-10-CM | POA: Diagnosis not present

## 2015-06-18 DIAGNOSIS — K432 Incisional hernia without obstruction or gangrene: Secondary | ICD-10-CM | POA: Diagnosis not present

## 2015-06-18 DIAGNOSIS — R569 Unspecified convulsions: Secondary | ICD-10-CM | POA: Diagnosis not present

## 2015-06-18 NOTE — Discharge Summary (Signed)
Patient ID: [name] [id number] 64 y.o. January 12, 1951  Admit date: 06/16/2015  Discharge date and time: 06/18/2015  Admitting Physician: Coralie Keens  Discharge Physician: Adin Hector  Admission Diagnoses: Incisional Hernia  Discharge Diagnoses: Incisional hernia  Operations: Procedure(s): LAPAROSCOPIC INCISIONAL HERNIA WITH MESH INSERTION OF MESH  Admission Condition: good  Discharged Condition: good  Indication for Admission: This is a 64 year old Afro-American female who presents with an incisional hernia.  Her PCP is Dr. Carol Ada.  She's noticed a hernia in her abdominal wall for at least 2 years.  No discomfort, just getting larger.  She has had a hysterectomy in the past.  Examination as an outpatient revealed a moderate-sized incisional hernia in her midline incision below the umbilicus and projecting a little bit to the left.  Hospital Course: On the day of admission the patient was taken to the operating room.  She underwent laparoscopic incisional hernia repair with mesh.  A 15 cm x 20 cm ventral light mesh from [employer] was used.  The surgery was uneventful.  Over the next 48 hours she progressed in her diet and activities and was ready to go home.  On the morning of June 25 she requested discharge home.  Stated that she had been emanating in the hall and was tolerating her diet and her pain was under good control.  She has slept well the night before.  Had no voiding symptoms.  The day of discharge her abdomen is soft and nondistended.  Minimal incisional tenderness.  All the trocar sites look good.  She states that she has an appointment scheduled see Dr. [name] on July 18.  She was given a prescription for narcotic analgesia for pain.  Diet and activities were discussed.  Consults: None  Significant Diagnostic Studies: Preop lab work  Treatments: surgery: Laparoscopic incisional hernia repair with mesh  Disposition: Home  Patient Instructions:     Medication List    TAKE these medications        atorvastatin 40 MG tablet  Commonly known as:  LIPITOR  Take 40 mg by mouth daily.     calcium-vitamin D 250-125 MG-UNIT per tablet  Commonly known as:  OSCAL WITH D  Take 1 tablet by mouth daily.     divalproex 500 MG 24 hr tablet  Commonly known as:  DEPAKOTE ER  1 tablet the morning, 2 tablets in the evening     eletriptan 40 MG tablet  Commonly known as:  RELPAX  Take 1 tablet (40 mg total) by mouth as needed for migraine or headache. May repeat in 2 hours if headache persists or recurs.     ferrous sulfate 325 (65 FE) MG tablet  Take 325 mg by mouth daily with breakfast.     HORIZANT 600 MG Tbcr  Generic drug:  Gabapentin Enacarbil  Take 600 mg by mouth every evening.     HYDROcodone-acetaminophen 5-325 MG per tablet  Commonly known as:  NORCO  Take 1-2 tablets by mouth every 4 (four) hours as needed for moderate pain or severe pain.     hydrOXYzine 10 MG tablet  Commonly known as:  ATARAX/VISTARIL  Take 10 mg by mouth 3 (three) times daily as needed for itching or anxiety.     lidocaine 4 % external solution  Commonly known as:  XYLOCAINE  Apply 0.5 mLs topically every 2 (two) hours.     LINZESS 290 MCG Caps capsule  Generic drug:  Linaclotide  Take 290 mcg by mouth daily.  magnesium oxide 400 MG tablet  Commonly known as:  MAG-OX  Take 400 mg by mouth 2 (two) times daily.     metoCLOPramide 5 MG tablet  Commonly known as:  REGLAN  Take 5 mg by mouth 3 (three) times daily as needed for nausea.     multivitamin with minerals Tabs tablet  Take 1 tablet by mouth daily.     olmesartan 40 MG tablet  Commonly known as:  BENICAR  Take 40 mg by mouth daily.     ondansetron 4 MG tablet  Commonly known as:  ZOFRAN  Take 4 mg by mouth every 8 (eight) hours as needed for nausea or vomiting.     tiZANidine 4 MG tablet  Commonly known as:  ZANAFLEX  Take 1 tablet (4 mg total) by mouth 2 (two) times daily as  needed for muscle spasms.     topiramate 100 MG tablet  Commonly known as:  TOPAMAX  Take 1 tablet (100 mg total) by mouth 2 (two) times daily.     traMADol 50 MG tablet  Commonly known as:  ULTRAM  Take 50 mg by mouth 3 (three) times daily as needed (pain).     traMADol 100 MG 24 hr tablet  Commonly known as:  ULTRAM-ER  Take 100 mg by mouth every evening.     TURMERIC CURCUMIN PO  Take 1 tablet by mouth daily.     verapamil 360 MG 24 hr capsule  Commonly known as:  VERELAN PM  Take 360 mg by mouth daily.        Activity: Unlimited ambulation.  May walk upstairs.  No driving for 2 weeks.  15 pound limit on lifting. Diet: low fat, low cholesterol diet Wound Care: none needed  Follow-up:  With Dr. [name] in 3 weeks.  Signed: Edsel Petrin. Dalbert Batman, M.D., FACS General and minimally invasive surgery Breast and Colorectal Surgery  06/18/2015, 5:54 AM

## 2015-07-14 ENCOUNTER — Encounter: Payer: Self-pay | Admitting: Nurse Practitioner

## 2015-07-14 ENCOUNTER — Ambulatory Visit (INDEPENDENT_AMBULATORY_CARE_PROVIDER_SITE_OTHER): Payer: Medicare Other | Admitting: Nurse Practitioner

## 2015-07-14 VITALS — BP 136/78 | HR 68 | Ht 60.0 in | Wt 124.0 lb

## 2015-07-14 DIAGNOSIS — R51 Headache: Secondary | ICD-10-CM

## 2015-07-14 DIAGNOSIS — G8929 Other chronic pain: Secondary | ICD-10-CM

## 2015-07-14 DIAGNOSIS — R400 Somnolence: Secondary | ICD-10-CM

## 2015-07-14 DIAGNOSIS — G4710 Hypersomnia, unspecified: Secondary | ICD-10-CM

## 2015-07-14 MED ORDER — TOPIRAMATE 100 MG PO TABS: 100.0000 mg | ORAL_TABLET | Freq: Every day | ORAL | Status: DC

## 2015-07-14 NOTE — Progress Notes (Signed)
I agree with the assessment and plan as directed by NP .The patient is known to me .   Michelle Speir, MD  

## 2015-07-14 NOTE — Progress Notes (Signed)
[facility]  PATIENT: [name] DOB: 28-Aug-1951   REASON FOR VISIT: Follow-up for headaches,  daytime drowsiness  HISTORY FROM: Patient and husband    HISTORY OF PRESENT ILLNESS: Right-handed African American married female with daily left hemicranial headaches lasting 30 minutes to 1-1/2 hours usually occurring 5 or 6 times per day, previous patient of Dr. Erling Cruz.  Her headaches are always on the left side. She also has a history of complex partial Seizures, last seizure was 2007.   She had an MRI studies of her brain 05/2009, 09/15/09, and 07/02/12 showing stable en plaque meningiomas, one over the left lateral convexity and the other in the middle of the anterior fossa.  There is improvement in the frequency and severity of her headaches with the use of Indomethacin . It cuts down her headaches approximately 75%. Indomethacin is associated with elevated creatinine. She is followed by Dr.Webb. Her creatinine runs 1.19. Because of this she discontinued her indomethacin .  She tried Botox by Dr. Tyron Russell and Addelman without benefit . Nasal oxygen increases the speed with which her headache pain relief occurs. She developed headaches in February 2012 and went to the emergency room. She went to the emergency room 10/19/213 with headaches. She continued to have headaches after her ER visit. They are worse at night than during the day. They are not associated with eyelid droop, nasal stuffiness, or tearing. She has gained weight on divalproex sodium.   UPDATE April 8th 2014:  She continues to have left side headaches, about five-six times a day, lasting less than one hour. She has stopped working because of headaches, She is alternating between tylenol, tizanidin, flexeril, Reglan, oxygen during headaches.  Imitrex does not work, she worries about its over use too. Overall , she is happy about current treatment  UPDATE Oct 02 2013:  She complains of left side headache,  close to her left ear, done to left neck, every day, 5-6 times a day, lasting 45 minutes, She is taking flexeril, tizanidin, reglan for headaches, she is also taking Depakote, Topamax for headache prevention.  64 year old female returns for followup. She called in the office on 03/24/2014 and was given a prednisone dosepak by Dr. [name]. Patient says she was told to stop her preventives while she was on the dosepak. She has not gotten relief from the dosepak. I made her aware that she was not supposed to stop the preventives she takes the dose pack in addition. She brings in her headache diary, she has 2 severe headaches in February, 7 severe headaches in March, that is when she called in for prednisone which has been helpful in the past. She claimed her headache was a 10 when I first walked into the room today but after she was set up to receive IV infusion suddenly her headache went away. She is on multiple preventives to include Depakote, Flexeril, magnesium, nortriptyline Zanaflex, Topamax, and verapamil She takes Zomig acutely, she also has oxygen to take for her headaches which she did not try this morning before coming to the office. She returns for reevaluation  UPDATE Jan 20th 2016: She continues to complain of left side headaches, constant, 1-2 out of 10, but few times each day, it would exacerbated to a moderate headaches, especially at the end of the day, She continued to be on polypharmacy, this including Depakote ER 500 mg every night, Topamax 100 mg twice a day, tizanidine 4 mg 2 tablets as needed, magnesium oxide, verapamil, Relpax  works well for her, but she is taking it almost daily basis  Update 07/14/2015: [name], 64 year old female returns for follow-up. She continues to have frequent headaches almost on a daily basis and is on polypharmacy to include Depakote Topamax tizanidine magnesium oxide verapamil and Relpax. She woke up with a headache this morning however she did not take  her Relpax. Her husband reports that she snores at night. She complains of significant daytime drowsiness and scores 15 on ESS. She also complains of frequent awakening at night. She is wanting to decrease her Topamax as she has not had a seizure in many years. With the frequency of her headaches I'm not sure her medications are working for her anyway. She returns for reevaluation  REVIEW OF SYSTEMS: Full 14 system review of systems performed and notable only for those listed, all others are neg:  Constitutional: neg  Cardiovascular: neg Ear/Nose/Throat: neg  Skin: neg Eyes: neg Respiratory: neg Gastroitestinal: Constipation Hematology/Lymphatic: neg  Endocrine: neg Musculoskeletal:neg Allergy/Immunology: neg Neurological: Chronic headache  Psychiatric: neg Sleep : Frequent wakening, daytime drowsiness   ALLERGIES: Allergies  Allergen Reactions  . Nsaids Other (See Comments)    Can cause ulcers  . Aspirin Other (See Comments) and Nausea Only    Could cause ulcers Could cause ulcers Could cause ulcers    HOME MEDICATIONS: Outpatient Prescriptions Prior to Visit  Medication Sig Dispense Refill  . atorvastatin (LIPITOR) 40 MG tablet Take 40 mg by mouth daily.    . calcium-vitamin D (OSCAL WITH D) 250-125 MG-UNIT per tablet Take 1 tablet by mouth daily.    . divalproex (DEPAKOTE ER) 500 MG 24 hr tablet 1 tablet the morning, 2 tablets in the evening 90 tablet 3  . eletriptan (RELPAX) 40 MG tablet Take 1 tablet (40 mg total) by mouth as needed for migraine or headache. May repeat in 2 hours if headache persists or recurs. 15 tablet 11  . ferrous sulfate 325 (65 FE) MG tablet Take 325 mg by mouth daily with breakfast.    . HORIZANT 600 MG TBCR Take 600 mg by mouth every evening.  11  . HYDROcodone-acetaminophen (NORCO) 5-325 MG per tablet Take 1-2 tablets by mouth every 4 (four) hours as needed for moderate pain or severe pain. 40 tablet 0  . Linaclotide (LINZESS) 290 MCG CAPS  capsule Take 290 mcg by mouth daily.    . magnesium oxide (MAG-OX) 400 MG tablet Take 400 mg by mouth 2 (two) times daily.     . metoCLOPramide (REGLAN) 5 MG tablet Take 5 mg by mouth 3 (three) times daily as needed for nausea.    . Multiple Vitamin (MULTIVITAMIN WITH MINERALS) TABS Take 1 tablet by mouth daily.    Marland Kitchen olmesartan (BENICAR) 40 MG tablet Take 40 mg by mouth daily.    Marland Kitchen tiZANidine (ZANAFLEX) 4 MG tablet Take 1 tablet (4 mg total) by mouth 2 (two) times daily as needed for muscle spasms. 60 tablet 11  . topiramate (TOPAMAX) 100 MG tablet Take 1 tablet (100 mg total) by mouth 2 (two) times daily. 60 tablet 11  . traMADol (ULTRAM) 50 MG tablet Take 50 mg by mouth 3 (three) times daily as needed (pain).   4  . traMADol (ULTRAM-ER) 100 MG 24 hr tablet Take 100 mg by mouth every evening.   0  . verapamil (VERELAN PM) 360 MG 24 hr capsule Take 360 mg by mouth daily.  2  . ondansetron (ZOFRAN) 4 MG tablet Take 4 mg  by mouth every 8 (eight) hours as needed for nausea or vomiting.    . hydrOXYzine (ATARAX/VISTARIL) 10 MG tablet Take 10 mg by mouth 3 (three) times daily as needed for itching or anxiety.    . lidocaine (XYLOCAINE) 4 % external solution Apply 0.5 mLs topically every 2 (two) hours.  11  . TURMERIC CURCUMIN PO Take 1 tablet by mouth daily.     No facility-administered medications prior to visit.    PAST MEDICAL HISTORY: Past Medical History  Diagnosis Date  . High cholesterol   . Hypertension   . Type II diabetes mellitus dx'd ~ 2014  . GERD (gastroesophageal reflux disease)   . Migraine     "daily" (06/16/2015)  . Epilepsy, grand mal     "last seizure ~ 2001" (06/16/2015)    PAST SURGICAL HISTORY: Past Surgical History  Procedure Laterality Date  . Laparoscopic incisional / umbilical / ventral hernia repair  06/16/2015    IHR w/mesh  . Hernia repair    . Vaginal hysterectomy  ~ 1992  . Incisional hernia repair N/A 06/16/2015    Procedure: LAPAROSCOPIC INCISIONAL  HERNIA WITH MESH;  Surgeon: Coralie Keens, MD;  Location: [facility];  Service: General;  Laterality: N/A;  . Insertion of mesh N/A 06/16/2015    Procedure: INSERTION OF MESH;  Surgeon: Coralie Keens, MD;  Location: [facility];  Service: General;  Laterality: N/A;    FAMILY HISTORY: Family History  Problem Relation Age of Onset  . Lung cancer Mother   . Hypertension Other   . Kidney failure Father     SOCIAL HISTORY: History   Social History  . Marital Status: Married    Spouse Name: Elenore Rota  . Number of Children: 3  . Years of Education: college   Occupational History  .      does not work   Social History Main Topics  . Smoking status: Never Smoker   . Smokeless tobacco: Never Used  . Alcohol Use: No  . Drug Use: No  . Sexual Activity: Not on file   Other Topics Concern  . Not on file   Social History Narrative   Patient lives at home with her husband Elenore Rota). Patient was a homemaker.   Right handed.   College education.   Caffeine- One cup of coffee daily.   Patient has three children.     PHYSICAL EXAM  Filed Vitals:   07/14/15 0907  BP: 136/78  Pulse: 68  Height: 5' (1.524 m)  Weight: 124 lb (56.246 kg)   Body mass index is 24.22 kg/(m^2). Generalized: Well developed, in no acute distress  Head: normocephalic and atraumatic,. Oropharynx benign Mallopatti 3 to 4 Neck: Supple, no carotid bruits neck size 14 Cardiac: Regular rate rhythm, no murmur  Musculoskeletal: No deformity   Neurological examination   Mentation: Alert oriented to time, place, history taking. Follows all commands speech and language fluent, depressed looking elderly female ESS 15  Cranial nerve II-XII: Pupils were equal round reactive to light extraocular movements were full, visual field were full on confrontational test. Facial sensation and strength were normal. hearing was intact to finger rubbing bilaterally. Uvula tongue midline. head turning and shoulder shrug were normal  and symmetric.Tongue protrusion into cheek strength was normal. Motor: normal bulk and tone, full strength in the BUE, BLE, fine finger movements normal, no pronator drift. No focal weakness Sensory: normal and symmetric to light touch, pinprick, and vibration  Coordination: finger-nose-finger, heel-to-shin bilaterally, no dysmetria Reflexes: Brachioradialis  2/2, biceps 2/2, triceps 2/2, patellar 2/2, Achilles 2/2, plantar responses were flexor bilaterally. Gait and Station: Rising up from seated position without assistance, normal stance, moderate stride, good arm swing, smooth turning, able to perform tiptoe, and heel walking without difficulty. Tandem gait is steady    DIAGNOSTIC DATA (LABS, IMAGING, TESTING) - I reviewed patient records, labs, notes, testing and imaging myself where available.  Lab Results  Component Value Date   WBC 8.0 06/08/2015   HGB 12.2 06/08/2015   HCT 38.0 06/08/2015   MCV 89.6 06/08/2015   PLT 262 06/08/2015      Component Value Date/Time   NA 140 06/08/2015 1328   K 4.4 06/08/2015 1328   CL 104 06/08/2015 1328   CO2 26 06/08/2015 1328   GLUCOSE 78 06/08/2015 1328   BUN 24* 06/08/2015 1328   CREATININE 1.21* 06/08/2015 1328   CALCIUM 9.6 06/08/2015 1328   PROT 7.6 10/25/2008 1155   ALBUMIN 4.6 10/25/2008 1155   AST 16 10/25/2008 1155   ALT 12 10/25/2008 1155   ALKPHOS 52 10/25/2008 1155   BILITOT 0.4 10/25/2008 1155   GFRNONAA 71* 06/08/2015 1328   GFRAA 53* 06/08/2015 1328    Lab Results  Component Value Date   HGBA1C 6.3* 06/08/2015    ASSESSMENT AND PLAN  64 y.o. year old female  has a past medical history of High cholesterol; Hypertension; Type II diabetes mellitus (dx'd ~ 2014); GERD (gastroesophageal reflux disease); Migraine; and Epilepsy, grand mal. here to follow-up. She is having a headache most days and complains of excessive daytime drowsiness and frequent awakening at night. She also wakes up with headaches.The patient is a  current patient of Dr. [name]  who is out of the office today . This note is sent to the work in doctor.     Will set up for sleep study I explained in particular the risks and ramifications of untreated moderate to severe OSA, especially with respect to cardiovascular disease  including congestive heart failure, difficult to treat hypertension, cardiac arrhythmias, or stroke. Even type 2 diabetes has, in part, been linked to untreated OSA. Symptoms of untreated OSA include daytime sleepiness, memory problems, mood irritability and mood disorder such as depression and anxiety, lack of energy, as well as recurrent headaches, especially morning headaches. We talked about trying to maintain a healthy lifestyle in general, as well as the importance of weight control. I encouraged the patient to eat healthy, exercise daily and keep well hydrated, to keep a scheduled bedtime and wake time routine, to not skip any meals and eat healthy snacks in between meals Decrease Topamax 100 mg daily will refill  Continue Flexeril at current dose Continue Depakote at current dose Continue Relpax at current dose Continue verapamil at current dose Follow-up in 6 months Dennie Bible, [facility], [facility], [facility] [address] [place], [place] [address] [phone]

## 2015-07-14 NOTE — Patient Instructions (Signed)
Will set up for sleep study Decrease Topamax 100 mg daily Continue Flexeril at current dose Continue Depakote at current dose Continue Relpax at current dose Continue verapamil at current dose Follow-up in 6 months

## 2015-07-19 DIAGNOSIS — G4723 Circadian rhythm sleep disorder, irregular sleep wake type: Secondary | ICD-10-CM | POA: Diagnosis not present

## 2015-07-19 DIAGNOSIS — G2581 Restless legs syndrome: Secondary | ICD-10-CM | POA: Diagnosis not present

## 2015-07-19 DIAGNOSIS — G43719 Chronic migraine without aura, intractable, without status migrainosus: Secondary | ICD-10-CM | POA: Diagnosis not present

## 2015-07-27 DIAGNOSIS — H00011 Hordeolum externum right upper eyelid: Secondary | ICD-10-CM | POA: Diagnosis not present

## 2015-08-04 DIAGNOSIS — G43011 Migraine without aura, intractable, with status migrainosus: Secondary | ICD-10-CM | POA: Diagnosis not present

## 2015-08-09 DIAGNOSIS — H00011 Hordeolum externum right upper eyelid: Secondary | ICD-10-CM | POA: Diagnosis not present

## 2015-08-12 DIAGNOSIS — H00011 Hordeolum externum right upper eyelid: Secondary | ICD-10-CM | POA: Diagnosis not present

## 2015-08-15 ENCOUNTER — Telehealth: Payer: Self-pay | Admitting: Neurology

## 2015-08-15 MED ORDER — DIVALPROEX SODIUM ER 500 MG PO TB24: ORAL_TABLET | ORAL | Status: DC

## 2015-08-15 NOTE — Telephone Encounter (Signed)
Patient is calling to get a refill for Rx divalproex(DEPAKOTE) ER 500 mg sent to [facility].  Thanks!

## 2015-08-15 NOTE — Telephone Encounter (Signed)
Rx has been sent.  Receipt confirmed by Pharmacy.

## 2015-08-17 DIAGNOSIS — G44049 Chronic paroxysmal hemicrania, not intractable: Secondary | ICD-10-CM | POA: Diagnosis not present

## 2015-08-17 DIAGNOSIS — G4723 Circadian rhythm sleep disorder, irregular sleep wake type: Secondary | ICD-10-CM | POA: Diagnosis not present

## 2015-08-17 DIAGNOSIS — G2581 Restless legs syndrome: Secondary | ICD-10-CM | POA: Diagnosis not present

## 2015-08-18 ENCOUNTER — Encounter: Payer: Self-pay | Admitting: Neurology

## 2015-08-18 ENCOUNTER — Ambulatory Visit (INDEPENDENT_AMBULATORY_CARE_PROVIDER_SITE_OTHER): Payer: Medicare Other | Admitting: Neurology

## 2015-08-18 VITALS — BP 132/76 | HR 78 | Resp 14 | Ht 61.0 in | Wt 122.0 lb

## 2015-08-18 DIAGNOSIS — G43019 Migraine without aura, intractable, without status migrainosus: Secondary | ICD-10-CM | POA: Diagnosis not present

## 2015-08-18 DIAGNOSIS — G4710 Hypersomnia, unspecified: Secondary | ICD-10-CM

## 2015-08-18 DIAGNOSIS — R0683 Snoring: Secondary | ICD-10-CM | POA: Diagnosis not present

## 2015-08-18 DIAGNOSIS — R51 Headache: Secondary | ICD-10-CM

## 2015-08-18 DIAGNOSIS — Z79899 Other long term (current) drug therapy: Secondary | ICD-10-CM

## 2015-08-18 DIAGNOSIS — R351 Nocturia: Secondary | ICD-10-CM

## 2015-08-18 DIAGNOSIS — R519 Headache, unspecified: Secondary | ICD-10-CM

## 2015-08-18 NOTE — Patient Instructions (Signed)
Based on your symptoms and your exam I believe you are at risk for obstructive sleep apnea or OSA, and I think we should proceed with a sleep study to determine whether you do or do not have OSA and how severe it is. If you have more than mild OSA, I want you to consider treatment with CPAP. Please remember, the risks and ramifications of moderate to severe obstructive sleep apnea or OSA are: Cardiovascular disease, including congestive heart failure, stroke, difficult to control hypertension, arrhythmias, and even type 2 diabetes has been linked to untreated OSA. Sleep apnea causes disruption of sleep and sleep deprivation in most cases, which, in turn, can cause recurrent headaches, problems with memory, mood, concentration, focus, and vigilance. Most people with untreated sleep apnea report excessive daytime sleepiness, which can affect their ability to drive. Please do not drive if you feel sleepy.   I will likely see you back after your sleep study to go over the test results and where to go from there. We will call you after your sleep study to advise about the results (most likely, you will hear from Beverlee Nims, my nurse) and to set up an appointment at the time, as necessary.    Our sleep lab administrative assistant, Arrie Aran will meet with you or call you to schedule your sleep study. If you don't hear back from her by next week please feel free to call her at [phone]. This is her direct line and please leave a message with your phone number to call back if you get the voicemail box. She will call back as soon as possible.   Please talk with Dr. Tamala Julian and Dr. [name] as well as your headache specialist about your numerous medications; I am really worried about you taking so many, potentially sedating meds!

## 2015-08-18 NOTE — Progress Notes (Signed)
Subjective:    Patient ID: [name] is a 64 y.o. female.  HPI     Star Age, MD, PhD [facility] [address], [address] [place], [place] [address]  Dear Aliene Beams and Hoyle Sauer,   I saw your patient, [name], upon your kind request in my clinic today for initial consultation of her sleep disorder, in particular, concern for underlying obstructive sleep apnea. The patient is unaccompanied today. As you know, [name] is a 64 year old right-handed woman with an underlying medical history of generalized seizure disorder, migraine headaches, reflux disease, type 2 diabetes, hypertension, and hyperlipidemia, who reports snoring (per husband), morning headaches, and excessive daytime somnolence as well as frequent nighttime awakenings.  Of note, she is on several potentially sedating medications, including Norco prn (about 1/week), reglan 5 mg bid, zanaflex 4 mg bid, topamax 100 mg qHS, tramadol 100 mg longacting at night and 50 mg IR prn, about 2-3 per, Horizant 600 mg qHS, ondansetron (is not sure if she takes it), verapamil 240 mg (per PCP) and Depakote ER. Of note, the Depakote is written as 500 mg 1 in the morning and 2 at night but she states that she is on 250 mg strength one tablet twice daily. She complains of excessive sleepiness during the day. Her Epworth sleepiness score is 16 out of 24, her fatigue scores 36 out of 63. She is not sure why she is taking Reglan. She also has been seeing a headache specialist out of [place], [place], Dr. Gordy Levan (sp?).  She has nocturia, on average twice per night. She goes to bed around 9 and her rise time is around 7. She feels drowsy upon awakening. She has a morning headache or nighttime headache almost daily.  She had a sleep study about 8 years ago in 2008 but is not sure what it showed. Prior sleep test results are not available for my review today.  Her Past Medical History Is Significant For: Past  Medical History  Diagnosis Date  . High cholesterol   . Hypertension   . Type II diabetes mellitus dx'd ~ 2014  . GERD (gastroesophageal reflux disease)   . Migraine     "daily" (06/16/2015)  . Epilepsy, grand mal     "last seizure ~ 2001" (06/16/2015)    Her Past Surgical History Is Significant For: Past Surgical History  Procedure Laterality Date  . Laparoscopic incisional / umbilical / ventral hernia repair  06/16/2015    IHR w/mesh  . Hernia repair    . Vaginal hysterectomy  ~ 1992  . Incisional hernia repair N/A 06/16/2015    Procedure: LAPAROSCOPIC INCISIONAL HERNIA WITH MESH;  Surgeon: Coralie Keens, MD;  Location: [facility];  Service: General;  Laterality: N/A;  . Insertion of mesh N/A 06/16/2015    Procedure: INSERTION OF MESH;  Surgeon: Coralie Keens, MD;  Location: [facility];  Service: General;  Laterality: N/A;    Her Family History Is Significant For: Family History  Problem Relation Age of Onset  . Lung cancer Mother   . Hypertension Other   . Kidney failure Father     Her Social History Is Significant For: Social History   Social History  . Marital Status: Married    Spouse Name: Elenore Rota  . Number of Children: 3  . Years of Education: college   Occupational History  .      does not work   Social History Main Topics  . Smoking status: Never  Smoker   . Smokeless tobacco: Never Used  . Alcohol Use: No  . Drug Use: No  . Sexual Activity: Not Asked   Other Topics Concern  . None   Social History Narrative   Patient lives at home with her husband Elenore Rota). Patient was a homemaker.   Right handed.   College education.   Caffeine- One cup of coffee daily.   Patient has three children.    Her Allergies Are:  Allergies  Allergen Reactions  . Nsaids Other (See Comments)    Can cause ulcers  . Aspirin Other (See Comments) and Nausea Only    Could cause ulcers Could cause ulcers Could cause ulcers  :   Her Current Medications Are:  Outpatient  Encounter Prescriptions as of 08/18/2015  Medication Sig  . atorvastatin (LIPITOR) 40 MG tablet Take 40 mg by mouth daily.  Marland Kitchen BUTRANS 5 MCG/HR PTWK patch   . calcium-vitamin D (OSCAL WITH D) 250-125 MG-UNIT per tablet Take 1 tablet by mouth daily.  . divalproex (DEPAKOTE ER) 500 MG 24 hr tablet 1 tablet the morning, 2 tablets in the evening  . eletriptan (RELPAX) 40 MG tablet Take 1 tablet (40 mg total) by mouth as needed for migraine or headache. May repeat in 2 hours if headache persists or recurs.  Marland Kitchen esomeprazole (NEXIUM) 40 MG capsule Take 40 mg by mouth daily at 12 noon.   . ferrous sulfate 325 (65 FE) MG tablet Take 325 mg by mouth daily with breakfast.  . HORIZANT 600 MG TBCR Take 600 mg by mouth every evening.  Marland Kitchen HYDROcodone-acetaminophen (NORCO) 5-325 MG per tablet Take 1-2 tablets by mouth every 4 (four) hours as needed for moderate pain or severe pain.  . Linaclotide (LINZESS) 290 MCG CAPS capsule Take 290 mcg by mouth daily.  . magnesium oxide (MAG-OX) 400 MG tablet Take 400 mg by mouth 2 (two) times daily.   . metoCLOPramide (REGLAN) 5 MG tablet Take 5 mg by mouth 3 (three) times daily as needed for nausea.  . Multiple Vitamin (MULTIVITAMIN WITH MINERALS) TABS Take 1 tablet by mouth daily.  Marland Kitchen olmesartan (BENICAR) 40 MG tablet Take 40 mg by mouth daily.  . ondansetron (ZOFRAN) 4 MG tablet Take 4 mg by mouth every 8 (eight) hours as needed for nausea or vomiting.  Marland Kitchen tiZANidine (ZANAFLEX) 4 MG tablet Take 1 tablet (4 mg total) by mouth 2 (two) times daily as needed for muscle spasms.  Marland Kitchen topiramate (TOPAMAX) 100 MG tablet Take 1 tablet (100 mg total) by mouth daily.  . traMADol (ULTRAM) 50 MG tablet Take 50 mg by mouth 3 (three) times daily as needed (pain).   . traMADol (ULTRAM-ER) 100 MG 24 hr tablet Take 100 mg by mouth every evening.   . verapamil (VERELAN PM) 360 MG 24 hr capsule Take 360 mg by mouth daily.  . [DISCONTINUED] ondansetron (ZOFRAN) 8 MG tablet Take 8 mg by mouth every  8 (eight) hours as needed.    No facility-administered encounter medications on file as of 08/18/2015.  :  Review of Systems:  Out of a complete 14 point review of systems, all are reviewed and negative with the exception of these symptoms as listed below:   Review of Systems  Constitutional: Positive for fatigue.  Respiratory:       Snoring   Neurological: Positive for tremors.       Uses sleep aid to help fall asleep, headaches that wake her from sleep, snoring, wakes up feeling  tired in the morning, sometimes takes naps during the day.   Psychiatric/Behavioral:       Not enough sleep, disinterest in activities     Objective:  Neurologic Exam  Physical Exam Physical Examination:   Filed Vitals:   08/18/15 1012  BP: 132/76  Pulse: 78  Resp: 14   General Examination: The patient is a very pleasant 64 y.o. female in no acute distress. She appears well-developed and well-nourished and well groomed.   HEENT: Normocephalic, atraumatic, pupils are equal, round and reactive to light and accommodation. Funduscopic exam is normal with sharp disc margins noted. Extraocular tracking is good without limitation to gaze excursion or nystagmus noted. Normal smooth pursuit is noted. Hearing is grossly intact. Tympanic membranes are clear bilaterally. Face is symmetric with slight facial masking noted. Speech is soft and slightly slow with no dysarthria noted. There is no lip, neck/head, jaw or voice tremor. Neck is supple with full range of passive and active motion. There are no carotid bruits on auscultation. Oropharynx exam reveals: moderate mouth dryness, adequate dental hygiene and mild airway crowding, due to narrow airway entry. Mallampati is class II. Neck circumference is slender at 12 inches. Tongue protrudes centrally and palate elevates symmetrically.  Chest: Clear to auscultation without wheezing, rhonchi or crackles noted.  Heart: S1+S2+0, regular and normal without murmurs, rubs or  gallops noted.   Abdomen: Soft, non-tender and non-distended with normal bowel sounds appreciated on auscultation.  Extremities: There is no pitting edema in the distal lower extremities bilaterally. Pedal pulses are intact.  Skin: Warm and dry without trophic changes noted. There are no varicose veins.  Musculoskeletal: exam reveals no obvious joint deformities, tenderness or joint swelling or erythema.   Neurologically:  Mental status: The patient is awake, alert and oriented in all 4 spheres. Her immediate and remote memory, attention, language skills and fund of knowledge are appropriate. There is no evidence of aphasia, agnosia, apraxia or anomia.  She appears to have mild hypophonia and mild slowness in thinking. Cranial nerves II - XII are as described above under HEENT exam. In addition: shoulder shrug is normal with equal shoulder height noted. Motor exam: Normal bulk, strength and tone is noted. There is no drift, tremor or rebound. Romberg is negative. Reflexes are 2+ throughout. Fine motor skills and coordination: intact with normal finger taps, normal hand movements, normal rapid alternating patting, normal foot taps and normal foot agility.  Cerebellar testing: No dysmetria or intention tremor on finger to nose testing. Heel to shin is unremarkable bilaterally. There is  overall mild slowness noted in her movements. Sensory exam: intact to light touch, pinprick, vibration, temperature sense in the upper and lower extremities.  Gait, station and balance:  she stands up slowly and walks slowly. Tandem walk is difficult for her.   Assessment and Plan:   In summary, [name] is a very pleasant 64 y.o.-year old female with an underlying medical history of generalized seizure disorder, migraine headaches, reflux disease, type 2 diabetes, hypertension, and hyperlipidemia, who reports snoring (per husband), morning headaches, and excessive daytime somnolence as well as frequent  nighttime awakenings. In addition, she reports nocturia. Her history and physical exam are somewhat concerning for underlying obstructive sleep apnea (OSA). However, I worry about her polypharmacy. She is on multiple potentially quite sedating medications and I cannot help but wonder whether she has a mild touch of parkinsonism which could be secondary to metoclopramide. She's not 100% sure whether she takes it but  she has mild slowness and mild facial masking and mild hypophonia. She is strongly advised to help sort out her medications as she is not fully sure about multiple of her medications and seems to also take medications differently from how they are prescribed. She is encouraged to address her polypharmacy with her headache specialist and [facility], with you, and her primary care physician. I had a long chat with the patient about my findings and the diagnosis of OSA, its prognosis and treatment options. We talked about medical treatments, surgical interventions and non-pharmacological approaches. I explained in particular the risks and ramifications of untreated moderate to severe OSA, especially with respect to developing cardiovascular disease down the Road, including congestive heart failure, difficult to treat hypertension, cardiac arrhythmias, or stroke. Even type 2 diabetes has, in part, been linked to untreated OSA. Symptoms of untreated OSA include daytime sleepiness, memory problems, mood irritability and mood disorder such as depression and anxiety, lack of energy, as well as recurrent headaches, especially morning headaches. We talked about trying to maintain a healthy lifestyle in general. I encouraged the patient to eat healthy, exercise daily and keep well hydrated, to keep a scheduled bedtime and wake time routine, to not skip any meals and eat healthy snacks in between meals. I advised the patient not to drive when feeling sleepy. I recommended the following at this time: sleep study with  potential positive airway pressure titration. (We will score hypopneas at 4% and split the sleep study into diagnostic and treatment portion, if the estimated. 2 hour AHI is >15/h).   I explained the sleep test procedure to the patient and also outlined possible surgical and non-surgical treatment options of OSA, including the use of a custom-made dental device (which would require a referral to a specialist dentist or oral surgeon), upper airway surgical options, such as pillar implants, radiofrequency surgery, tongue base surgery, and UPPP (which would involve a referral to an ENT surgeon). Rarely, jaw surgery such as mandibular advancement may be considered.  I also explained the CPAP treatment option to the patient, who indicated that she would be willing to try CPAP if the need arises. I explained the importance of being compliant with PAP treatment, not only for insurance purposes but primarily to improve Her symptoms, and for the patient's long term health benefit, including to reduce Her cardiovascular risks. I answered all her questions today and the patient was in agreement. I would like to see her back after the sleep study is completed and encouraged her to call with any interim questions, concerns, problems or updates.   Thank you very much for allowing me to participate in the care of this nice patient. If I can be of any further assistance to you please do not hesitate to talk to me.   Sincerely,   Star Age, MD, PhD  I spent 25 minutes in total face-to-face time with the patient, more than 50% of which was spent in counseling and coordination of care, reviewing test results, reviewing medication and discussing or reviewing the diagnosis of OSA and sleepiness and polypharmacy, the prognosis and treatment options.

## 2015-08-22 ENCOUNTER — Telehealth: Payer: Self-pay

## 2015-08-22 DIAGNOSIS — Z5181 Encounter for therapeutic drug level monitoring: Secondary | ICD-10-CM

## 2015-08-22 NOTE — Telephone Encounter (Signed)
Per note from 11/02, patient was to begin taking 500mg  in am and 1000mg  in pm.  At [place] in Jan, Dr [name] filled Rx for this dose.  Last OV note says continue same dose, so the refill we sent was for 500mg /1000mg .  I called back and spoke to the patient.  She has been refilling an old Rx the pharmacy had on file for 250mg  one twice daily from July 2015.  She never increased the dose as recommended on 11/02.  Sending message to CM to review since Dr [name] is out of the office.

## 2015-08-22 NOTE — Telephone Encounter (Signed)
I called the patient back.  Got no answer.  Left message relaying providers note.

## 2015-08-22 NOTE — Telephone Encounter (Signed)
May be the reason her headaches are not in good control. We need to check depakote level then change dose as indicated. I will put the order in the system. Please call the patient Thanks

## 2015-08-22 NOTE — Telephone Encounter (Signed)
Patient called to advise divalproex (DEPAKOTE ER) 500 MG 24 hr tablet was sent to pharmacy incorrectly. Was sent in for 1-500 mg in AM, 2-500 mg at night, is supposed to be 1 250 mg in AM, 1 250 mg at night.

## 2015-08-22 NOTE — Telephone Encounter (Signed)
Patient called regarding her Depakote Rx.  She has been refilling an old Rx the pharmacy had on file for 250mg  one twice daily from July 2015.   Per note from 11/02, patient was to begin taking 500mg  bid for 2 weeks, then 500mg  in am and 1000mg  in pm.  At [place] in Jan, Dr [name] filled Rx for this dose.  Last OV note says continue same dose, so the refill we sent was for 500mg /1000mg .  She never increased the dose as recommended on 11/02.  She is now requesting we change the Rx back to 250mg  one twice daily.  Last seen by CM on 07/21, has routine follow up scheduled in Jan with CM.  Please advise.  Thank you.

## 2015-08-23 ENCOUNTER — Other Ambulatory Visit (INDEPENDENT_AMBULATORY_CARE_PROVIDER_SITE_OTHER): Payer: Self-pay

## 2015-08-23 ENCOUNTER — Encounter (INDEPENDENT_AMBULATORY_CARE_PROVIDER_SITE_OTHER): Payer: Self-pay

## 2015-08-23 DIAGNOSIS — Z5181 Encounter for therapeutic drug level monitoring: Secondary | ICD-10-CM | POA: Diagnosis not present

## 2015-08-23 DIAGNOSIS — Z0289 Encounter for other administrative examinations: Secondary | ICD-10-CM

## 2015-08-23 NOTE — Telephone Encounter (Signed)
Pt called back, I relayed to the pt that she needs to come in for a lab draw. She expressed understanding and said she would come in.

## 2015-08-24 ENCOUNTER — Telehealth: Payer: Self-pay | Admitting: Nurse Practitioner

## 2015-08-24 LAB — VALPROIC ACID LEVEL: Valproic Acid Lvl: 38 ug/mL — ABNORMAL LOW (ref 50–100)

## 2015-08-24 NOTE — Telephone Encounter (Signed)
I called [facility].  Spoke with Marissa.  She reviewed the patient file and said they did receive the Rx on 08/22, and have confirmation the medication was delivered at the patient's home on 08/26.  I called the patient.  Says she does have the medication but did not want to take it because she wanted to take two 250mg  tabs twice daily.  The Rx she has is for 500mg  tabs.  Patient does not want to use local pharmacy, prefers mail order only.  Explained she can take the meds that were shipped, as Hoyle Sauer wants her to increase dose to 500mg  twice daily, so she can just take one of the 500mg  tabs twice daily instead of waiting for another Rx for 250mg  tabs two twice daily, as the total dose remains the same.  Patient expressed understanding and will take the meds she has that arrived Friday.  She is aware we can send a new Rx when a refill is needed, as ins will not pay for another refill at this time (too soon).  Patient will cal back if anything further is needed.

## 2015-08-24 NOTE — Telephone Encounter (Signed)
Telephone call to patient regarding low Depakote level. Would like to create increased to 2 tablets twice daily. Made her aware I will call her back later,  As I had to leave  voicemail

## 2015-08-24 NOTE — Telephone Encounter (Signed)
Order was faxed on 08/15/15. [name] can you see if it has been shipped Thanks

## 2015-08-24 NOTE — Telephone Encounter (Signed)
Repeat telephone call to [name]. She is currently taking 250 of Depakote twice daily with Depakote level of 38. She was asked to increase that dose to 500 mg twice daily. She gets  her medication through mail order. Will fax new GR

## 2015-09-09 DIAGNOSIS — H1131 Conjunctival hemorrhage, right eye: Secondary | ICD-10-CM | POA: Diagnosis not present

## 2015-09-11 ENCOUNTER — Ambulatory Visit (INDEPENDENT_AMBULATORY_CARE_PROVIDER_SITE_OTHER): Payer: Medicare Other | Admitting: Neurology

## 2015-09-11 DIAGNOSIS — G4710 Hypersomnia, unspecified: Secondary | ICD-10-CM | POA: Diagnosis not present

## 2015-09-11 DIAGNOSIS — G4733 Obstructive sleep apnea (adult) (pediatric): Secondary | ICD-10-CM

## 2015-09-11 DIAGNOSIS — G4720 Circadian rhythm sleep disorder, unspecified type: Secondary | ICD-10-CM

## 2015-09-11 NOTE — Sleep Study (Signed)
Please see the scanned sleep study interpretation located in the Procedure tab within the Chart Review section. 

## 2015-09-13 DIAGNOSIS — Z01411 Encounter for gynecological examination (general) (routine) with abnormal findings: Secondary | ICD-10-CM | POA: Diagnosis not present

## 2015-09-13 DIAGNOSIS — N951 Menopausal and female climacteric states: Secondary | ICD-10-CM | POA: Diagnosis not present

## 2015-09-14 ENCOUNTER — Telehealth: Payer: Self-pay | Admitting: Neurology

## 2015-09-14 ENCOUNTER — Telehealth: Payer: Self-pay | Admitting: *Deleted

## 2015-09-14 MED ORDER — METOCLOPRAMIDE HCL 5 MG PO TABS: 5.0000 mg | ORAL_TABLET | Freq: Three times a day (TID) | ORAL | Status: DC | PRN

## 2015-09-14 NOTE — Telephone Encounter (Signed)
Patient called requesting refill for metoCLOPramide (REGLAN) 5 MG tablet .Please send to [facility]. Patient can be reached at [phone].

## 2015-09-14 NOTE — Telephone Encounter (Signed)
Done.  I called pt and let her know.

## 2015-09-14 NOTE — Telephone Encounter (Signed)
Refill done.  [facility] to [facility].   Reglan for nausea when has headaches.

## 2015-09-14 NOTE — Telephone Encounter (Addendum)
Dr. Rhea Belton and Carolyn's patient, seen by me on 08/18/15, PSG on 09/11/15:  Please call and notify the patient that the recent sleep study did not show any significant obstructive sleep apnea, only borderline OSA when in REM sleep with no need for CPAP. However, she did not sleep very well and had a lot of light stage sleep and little REM/dream sleep, but could be due to migraine (she reported a migraine upon arrival and during the night, unfortunately). Please inform patient that she can follow up with Hoyle Sauer and Dr. [name]. From my end of things, I would like to remind her to keep good sleep hygiene, which means: Keep a regular sleep and wake schedule, try not to exercise or have a meal within 2 hours of your bedtime, try to keep your bedroom conducive for sleep, that is, cool and dark, without light distractors such as an illuminated alarm clock, and refrain from watching TV right before sleep or in the middle of the night and do not keep the TV or radio on during the night. Also, try not to use or play on electronic devices at bedtime, such as your cell phone, tablet PC or laptop. If you like to read at bedtime on an electronic device, try to dim the background light as much as possible. Do not eat in the middle of the night.   I can see her back if she desires to go over test results.   Also, route or fax report to PCP and referring MD, if other than PCP.  Once you have spoken to patient, you can close this encounter.   Thanks,  Star Age, MD, PhD [facility])

## 2015-09-15 NOTE — Telephone Encounter (Signed)
I spoke to patient and she is aware of the results and recommendations. I will mail her a list of sleep hygiene recommendations. I will fax report to PCP. Patient wanted to make a f/u appt with Dr. Rexene Alberts, so we made appt in October.

## 2015-09-20 DIAGNOSIS — E785 Hyperlipidemia, unspecified: Secondary | ICD-10-CM | POA: Diagnosis not present

## 2015-09-20 DIAGNOSIS — G43909 Migraine, unspecified, not intractable, without status migrainosus: Secondary | ICD-10-CM | POA: Diagnosis not present

## 2015-09-20 DIAGNOSIS — R109 Unspecified abdominal pain: Secondary | ICD-10-CM | POA: Diagnosis not present

## 2015-09-20 DIAGNOSIS — Z1389 Encounter for screening for other disorder: Secondary | ICD-10-CM | POA: Diagnosis not present

## 2015-09-20 DIAGNOSIS — I10 Essential (primary) hypertension: Secondary | ICD-10-CM | POA: Diagnosis not present

## 2015-09-21 NOTE — Telephone Encounter (Signed)
Patient called stating [facility] told her they had faxed over contraindication information and is waiting to hear back from our office before Reglan can be filled. Please call and advise pt at [phone].

## 2015-09-22 ENCOUNTER — Telehealth: Payer: Self-pay | Admitting: Neurology

## 2015-09-22 ENCOUNTER — Encounter: Payer: Self-pay | Admitting: *Deleted

## 2015-09-22 NOTE — Telephone Encounter (Addendum)
I spoke to pt and told her that faxed over form relating to reglan (generic) for 1 tablet po TID PRN for nausea.  #90 one refill to [facility] [phone].    Pt verbalized understanding.

## 2015-09-22 NOTE — Telephone Encounter (Signed)
See other phone note.  (spoke to pt, and faxed new order to [facility]).

## 2015-09-22 NOTE — Addendum Note (Signed)
Addended byOliver Hum on: 09/22/2015 05:17 PM   Modules accepted: Orders, Medications

## 2015-09-22 NOTE — Telephone Encounter (Signed)
Chart reviewed with Lovey Newcomer, patient did get refill of Reglan to our office in the past, last refill was August 2015, most recent refill was September 20 first 2015,  Will refill her Reglan 5 mg 90 tablets, with one refill, 1 tablet tid as needed,  Lovey Newcomer will also call patient, only take Reglan as needed, to avoid the potential long-term side effects, including tardive dyskinesia

## 2015-09-27 DIAGNOSIS — G2581 Restless legs syndrome: Secondary | ICD-10-CM | POA: Diagnosis not present

## 2015-09-27 DIAGNOSIS — G4723 Circadian rhythm sleep disorder, irregular sleep wake type: Secondary | ICD-10-CM | POA: Diagnosis not present

## 2015-09-27 DIAGNOSIS — G44049 Chronic paroxysmal hemicrania, not intractable: Secondary | ICD-10-CM | POA: Diagnosis not present

## 2015-10-04 DIAGNOSIS — E119 Type 2 diabetes mellitus without complications: Secondary | ICD-10-CM | POA: Diagnosis not present

## 2015-10-04 DIAGNOSIS — H25013 Cortical age-related cataract, bilateral: Secondary | ICD-10-CM | POA: Diagnosis not present

## 2015-10-04 DIAGNOSIS — H2513 Age-related nuclear cataract, bilateral: Secondary | ICD-10-CM | POA: Diagnosis not present

## 2015-10-04 DIAGNOSIS — H524 Presbyopia: Secondary | ICD-10-CM | POA: Diagnosis not present

## 2015-10-05 ENCOUNTER — Other Ambulatory Visit: Payer: Self-pay | Admitting: Neurology

## 2015-10-07 ENCOUNTER — Other Ambulatory Visit: Payer: Self-pay | Admitting: Family Medicine

## 2015-10-07 ENCOUNTER — Ambulatory Visit
Admission: RE | Admit: 2015-10-07 | Discharge: 2015-10-07 | Disposition: A | Discharge status: Home / Self Care | Payer: Medicare Other | Source: Ambulatory Visit | Attending: Family Medicine | Admitting: Family Medicine

## 2015-10-07 DIAGNOSIS — R1084 Generalized abdominal pain: Secondary | ICD-10-CM

## 2015-10-07 DIAGNOSIS — R109 Unspecified abdominal pain: Secondary | ICD-10-CM | POA: Diagnosis not present

## 2015-10-07 DIAGNOSIS — K5900 Constipation, unspecified: Secondary | ICD-10-CM | POA: Diagnosis not present

## 2015-10-10 ENCOUNTER — Ambulatory Visit (INDEPENDENT_AMBULATORY_CARE_PROVIDER_SITE_OTHER): Payer: Medicare Other | Admitting: Neurology

## 2015-10-10 ENCOUNTER — Encounter: Payer: Self-pay | Admitting: Neurology

## 2015-10-10 VITALS — BP 104/60 | HR 68 | Resp 14 | Ht 61.0 in | Wt 127.0 lb

## 2015-10-10 DIAGNOSIS — R0683 Snoring: Secondary | ICD-10-CM | POA: Diagnosis not present

## 2015-10-10 DIAGNOSIS — R519 Headache, unspecified: Secondary | ICD-10-CM

## 2015-10-10 DIAGNOSIS — R51 Headache: Secondary | ICD-10-CM

## 2015-10-10 DIAGNOSIS — G4710 Hypersomnia, unspecified: Secondary | ICD-10-CM

## 2015-10-10 NOTE — Patient Instructions (Signed)
Please remember, common headache triggers are: sleep deprivation, dehydration, overheating, stress, hypoglycemia or skipping meals and blood sugar fluctuations, excessive pain medications or excessive alcohol use or caffeine withdrawal. Some people have food triggers such as aged cheese, orange juice or chocolate, especially dark chocolate, or MSG (monosodium glutamate). Try to avoid these headache triggers as much possible. It may be helpful to keep a headache diary to figure out what makes your headaches worse or brings them on and what alleviates them. Some people report headache onset after exercise but studies have shown that regular exercise may actually prevent headaches from coming. If you have exercise-induced headaches, please make sure that you drink plenty of fluid before and after exercising and that you do not over do it and do not overheat.  Please make an appointment with Dr. [name].   Thankfully, your sleep study did not show any significant sleep disorder.   I will see you back if needed.

## 2015-10-10 NOTE — Progress Notes (Signed)
Subjective:    Patient ID: [name] is a 64 y.o. female.  HPI     Interim history:  [name] is a 64 year old right-handed woman with an underlying medical history of generalized seizure disorder, migraine headaches, reflux disease, type 2 diabetes, hypertension, and hyperlipidemia, who presents for follow-up consultation of her sleep disturbance, after her recent sleep study. The patient is accompanied by her husband today. I first met her on 08/18/2015 at the request of Dr. [name] and Cecille Rubin, at which time the patient reported snoring, morning headaches and excessive daytime somnolence as well as sleep disruption with frequent nighttime awakenings. I invited her back for sleep study. She had a baseline sleep study on 09/11/2015 and I went over the results with her in detail today: Sleep efficiency was reduced at 80.6% with a latency to sleep of 15.5 minutes and wake after sleep onset of 81 minutes with mild to moderate sleep fragmentation noted and to longer periods of wakefulness. She reported having a migraine upon arrival and complained of waking up with another migraine headache in the middle of the night. She was tearful and restless and was pacing around. Arousal index was normal. She had a markedly increased percentage of stage II sleep at 94.4%, absence of slow-wave sleep and a markedly reduced percentage of REM sleep at only 2.4% with a prolonged REM latency of 121 minutes. She had no significant PLMS, EKG or EEG changes. She had mild intermittent snoring. She had a total AHI normal at 0.7 per hour, rising to 6.3 per hour during REM sleep. Average oxygen saturation was 94%, nadir was 89%.  Today, 10/10/15: she reports doing about the same. She is bothered by morning headaches. She sees a headache specialist. She has not had any significant changes to her medication regimen. She does take some sedating medications and feels tired during the day. She does get enough sleep, she  believes. She does not drink enough water. She drinks about 1 bottle per day and likes to drink diet drinks and vegetable juice.  Previously:   08/18/15: She reports snoring (per husband), morning headaches, and excessive daytime somnolence as well as frequent nighttime awakenings.   Of note, she is on several potentially sedating medications, including Norco prn (about 1/week), reglan 5 mg bid, zanaflex 4 mg bid, topamax 100 mg qHS, tramadol 100 mg longacting at night and 50 mg IR prn, about 2-3 per, Horizant 600 mg qHS, ondansetron (is not sure if she takes it), verapamil 240 mg (per PCP) and Depakote ER. Of note, the Depakote is written as 500 mg 1 in the morning and 2 at night but she states that she is on 250 mg strength one tablet twice daily. She complains of excessive sleepiness during the day. Her Epworth sleepiness score is 16 out of 24, her fatigue scores 36 out of 63. She is not sure why she is taking Reglan. She also has been seeing a headache specialist out of [place], [place], Dr. Gordy Levan (sp?).   She has nocturia, on average twice per night. She goes to bed around 9 and her rise time is around 7. She feels drowsy upon awakening. She has a morning headache or nighttime headache almost daily.  She had a sleep study about 8 years ago in 2008 but is not sure what it showed. Prior sleep test results are not available for my review today.  Her Past Medical History Is Significant For: Past Medical History  Diagnosis Date  .  High cholesterol   . Hypertension   . Type II diabetes mellitus ([place]) dx'd ~ 2014  . GERD (gastroesophageal reflux disease)   . Migraine     "daily" (06/16/2015)  . Epilepsy, grand mal ([place])     "last seizure ~ 2001" (06/16/2015)    Her Past Surgical History Is Significant For: Past Surgical History  Procedure Laterality Date  . Laparoscopic incisional / umbilical / ventral hernia repair  06/16/2015    IHR w/mesh  . Hernia repair    . Vaginal hysterectomy  ~  1992  . Incisional hernia repair N/A 06/16/2015    Procedure: LAPAROSCOPIC INCISIONAL HERNIA WITH MESH;  Surgeon: Coralie Keens, MD;  Location: [facility];  Service: General;  Laterality: N/A;  . Insertion of mesh N/A 06/16/2015    Procedure: INSERTION OF MESH;  Surgeon: Coralie Keens, MD;  Location: [facility];  Service: General;  Laterality: N/A;    Her Family History Is Significant For: Family History  Problem Relation Age of Onset  . Lung cancer Mother   . Hypertension Other   . Kidney failure Father     Her Social History Is Significant For: Social History   Social History  . Marital Status: Married    Spouse Name: [name]  . Number of Children: 3  . Years of Education: college   Occupational History  .      does not work   Social History Main Topics  . Smoking status: Never Smoker   . Smokeless tobacco: Never Used  . Alcohol Use: No  . Drug Use: No  . Sexual Activity: Not Asked   Other Topics Concern  . None   Social History Narrative   Patient lives at home with her husband [name]). Patient was a homemaker.   Right handed.   College education.   Caffeine- One cup of coffee daily.   Patient has three children.    Her Allergies Are:  Allergies  Allergen Reactions  . Nsaids Other (See Comments)    Can cause ulcers  . Aspirin Other (See Comments) and Nausea Only    Could cause ulcers Could cause ulcers Could cause ulcers  :   Her Current Medications Are:  Outpatient Encounter Prescriptions as of 10/10/2015  Medication Sig  . atorvastatin (LIPITOR) 40 MG tablet Take 40 mg by mouth daily.  Marland Kitchen BUTRANS 5 MCG/HR PTWK patch   . calcium-vitamin D (OSCAL WITH D) 250-125 MG-UNIT per tablet Take 1 tablet by mouth daily.  . divalproex (DEPAKOTE ER) 500 MG 24 hr tablet 1 tablet the morning, 2 tablets in the evening (Patient taking differently: Take 500 mg by mouth 2 (two) times daily. )  . eletriptan (RELPAX) 40 MG tablet Take 1 tablet (40 mg total) by mouth as needed  for migraine or headache. May repeat in 2 hours if headache persists or recurs.  Marland Kitchen esomeprazole (NEXIUM) 40 MG capsule Take 40 mg by mouth daily at 12 noon.   . ferrous sulfate 325 (65 FE) MG tablet Take 325 mg by mouth daily with breakfast.  . HORIZANT 600 MG TBCR Take 600 mg by mouth every evening.  Marland Kitchen HYDROcodone-acetaminophen (NORCO) 5-325 MG per tablet Take 1-2 tablets by mouth every 4 (four) hours as needed for moderate pain or severe pain.  . Linaclotide (LINZESS) 290 MCG CAPS capsule Take 290 mcg by mouth daily.  . magnesium oxide (MAG-OX) 400 MG tablet Take 400 mg by mouth 2 (two) times daily.   . metoCLOPramide (REGLAN) 5 MG  tablet Take 5 mg by mouth 3 (three) times daily as needed for nausea.  . Multiple Vitamin (MULTIVITAMIN WITH MINERALS) TABS Take 1 tablet by mouth daily.  Marland Kitchen olmesartan (BENICAR) 40 MG tablet Take 40 mg by mouth daily.  . ondansetron (ZOFRAN) 4 MG tablet Take 4 mg by mouth every 8 (eight) hours as needed for nausea or vomiting.  Marland Kitchen tiZANidine (ZANAFLEX) 2 MG tablet TAKE 1 TABLET BY MOUTH  TWICE A DAY  . topiramate (TOPAMAX) 100 MG tablet Take 1 tablet (100 mg total) by mouth daily.  . traMADol (ULTRAM) 50 MG tablet Take 50 mg by mouth 3 (three) times daily as needed (pain).   . traMADol (ULTRAM-ER) 100 MG 24 hr tablet Take 100 mg by mouth every evening.   . verapamil (VERELAN PM) 240 MG 24 hr capsule   . [DISCONTINUED] tiZANidine (ZANAFLEX) 4 MG tablet Take 1 tablet (4 mg total) by mouth 2 (two) times daily as needed for muscle spasms.  . [DISCONTINUED] verapamil (VERELAN PM) 360 MG 24 hr capsule Take 360 mg by mouth daily.   No facility-administered encounter medications on file as of 10/10/2015.  :  Review of Systems:  Out of a complete 14 point review of systems, all are reviewed and negative with the exception of these symptoms as listed below:   Review of Systems  Neurological:       Patient is here to discuss sleep study. Her concern is that she is still  having headaches in the morning.     Objective:  Neurologic Exam  Physical Exam Physical Examination:   Filed Vitals:   10/10/15 1007  BP: 104/60  Pulse: 68  Resp: 14   General Examination: The patient is a very pleasant 64 y.o. female in no acute distress. She appears well-developed and well-nourished and well groomed.   HEENT: Normocephalic, atraumatic, pupils are equal, round and reactive to light and accommodation. Extraocular tracking is good without limitation to gaze excursion or nystagmus noted. Normal smooth pursuit is noted. Hearing is grossly intact. Face is symmetric with slight facial masking noted. Speech is soft and slightly slow with no dysarthria noted. There is no lip, neck/head, jaw or voice tremor. Neck is supple with full range of passive and active motion. There are no carotid bruits on auscultation. Oropharynx exam reveals: moderate mouth dryness, adequate dental hygiene and mild airway crowding, due to narrow airway entry. Mallampati is class II. Tongue protrudes centrally and palate elevates symmetrically.  Chest: Clear to auscultation without wheezing, rhonchi or crackles noted.  Heart: S1+S2+0, regular and normal without murmurs, rubs or gallops noted.   Abdomen: Soft, non-tender and non-distended with normal bowel sounds appreciated on auscultation.  Extremities: There is no pitting edema in the distal lower extremities bilaterally. Pedal pulses are intact.  Skin: Warm and dry without trophic changes noted. There are no varicose veins.  Musculoskeletal: exam reveals no obvious joint deformities, tenderness or joint swelling or erythema.   Neurologically:  Mental status: The patient is awake, alert and oriented in all 4 spheres. Her immediate and remote memory, attention, language skills and fund of knowledge are appropriate. There is no evidence of aphasia, agnosia, apraxia or anomia.  She appears to have mild hypophonia and mild slowness in  thinking. Cranial nerves II - XII are as described above under HEENT exam. In addition: shoulder shrug is normal with equal shoulder height noted. Motor exam: Normal bulk, strength and tone is noted. There is no drift, tremor or rebound. Romberg  is negative. Reflexes are 2+ throughout. Fine motor skills and coordination: intac. There is  overall mild slowness noted in her movements. Sensory exam: intact to light touch in the upper and lower extremities.  Gait, station and balance:  she stands up slowly and walks slowly. Tandem walk is a little better today.    Assessment and Plan:   In summary, LAPORSCHA LINEHAN is a very pleasant 64 year old female with an underlying medical history of generalized seizure disorder, migraine headaches, reflux disease, type 2 diabetes, hypertension, and hyperlipidemia, who reports snoring (per husband), morning headaches, and excessive daytime somnolence as well as frequent nighttime awakenings.  she had a recent sleep study on 09/11/2015 underwent over her test results with the patient and her husband today. I explained to her that she did not have any significant underlying organic sleep disorder and certainly no significant obstructive sleep apnea. She is reassured in that regard. We talked about headache triggers again today. She is encouraged to discuss this with Dr. [name] and advised to make a sooner appointment if needed. She is advised to stay better hydrated as she may not drink enough water. I explained to her that her daytime tiredness and somnolence may be a function of multiple players including not drinking enough water but also taking multiple potentially sedating medications. She is advised to consider checking her blood pressure first thing in the morning if she wakes up with a headache. Usually from what I can tell, her blood pressure does seem to be normal.  At this juncture, I asked her to FU with Dr. [name] and Cecille Rubin, NP. I will see her back if needed.   I spent 15 minutes in total face-to-face time with the patient, more than 50% of which was spent in counseling and coordination of care, reviewing test results, reviewing medication and discussing or reviewing the diagnosis of daytime sleepiness, and morning headache, the prognosis and treatment options.

## 2015-10-26 DIAGNOSIS — K5900 Constipation, unspecified: Secondary | ICD-10-CM | POA: Diagnosis not present

## 2015-10-26 DIAGNOSIS — D509 Iron deficiency anemia, unspecified: Secondary | ICD-10-CM | POA: Diagnosis not present

## 2015-10-26 DIAGNOSIS — R109 Unspecified abdominal pain: Secondary | ICD-10-CM | POA: Diagnosis not present

## 2015-10-31 ENCOUNTER — Emergency Department (HOSPITAL_COMMUNITY)
Admission: EM | Admit: 2015-10-31 | Discharge: 2015-10-31 | Disposition: A | Discharge status: Home / Self Care | Payer: Medicare Other | Attending: Emergency Medicine | Admitting: Emergency Medicine

## 2015-10-31 ENCOUNTER — Encounter (HOSPITAL_COMMUNITY): Payer: Self-pay | Admitting: Family Medicine

## 2015-10-31 DIAGNOSIS — R51 Headache: Secondary | ICD-10-CM | POA: Diagnosis present

## 2015-10-31 DIAGNOSIS — G43909 Migraine, unspecified, not intractable, without status migrainosus: Secondary | ICD-10-CM | POA: Insufficient documentation

## 2015-10-31 DIAGNOSIS — R519 Headache, unspecified: Secondary | ICD-10-CM

## 2015-10-31 DIAGNOSIS — E119 Type 2 diabetes mellitus without complications: Secondary | ICD-10-CM | POA: Insufficient documentation

## 2015-10-31 DIAGNOSIS — E782 Mixed hyperlipidemia: Secondary | ICD-10-CM | POA: Diagnosis not present

## 2015-10-31 DIAGNOSIS — Z79899 Other long term (current) drug therapy: Secondary | ICD-10-CM | POA: Diagnosis not present

## 2015-10-31 DIAGNOSIS — Z8669 Personal history of other diseases of the nervous system and sense organs: Secondary | ICD-10-CM | POA: Diagnosis not present

## 2015-10-31 DIAGNOSIS — K219 Gastro-esophageal reflux disease without esophagitis: Secondary | ICD-10-CM | POA: Diagnosis not present

## 2015-10-31 DIAGNOSIS — I10 Essential (primary) hypertension: Secondary | ICD-10-CM | POA: Diagnosis not present

## 2015-10-31 LAB — CBG MONITORING, ED: Glucose-Capillary: 82 mg/dL (ref 65–99)

## 2015-10-31 MED ORDER — DIPHENHYDRAMINE HCL 50 MG/ML IJ SOLN
25.0000 mg | Freq: Once | INTRAMUSCULAR | Status: AC
  Administered 2015-10-31: 25 mg via INTRAVENOUS
  Filled 2015-10-31: qty 1

## 2015-10-31 MED ORDER — PROCHLORPERAZINE EDISYLATE 5 MG/ML IJ SOLN
5.0000 mg | Freq: Once | INTRAMUSCULAR | Status: AC
  Administered 2015-10-31: 10 mg via INTRAMUSCULAR
  Filled 2015-10-31: qty 2

## 2015-10-31 MED ORDER — HYDROCODONE-ACETAMINOPHEN 5-325 MG PO TABS
1.0000 | ORAL_TABLET | Freq: Four times a day (QID) | ORAL | Status: DC | PRN
Start: 2015-10-31 — End: 2016-01-09

## 2015-10-31 MED ORDER — SODIUM CHLORIDE 0.9 % IV BOLUS (SEPSIS)
500.0000 mL | Freq: Once | INTRAVENOUS | Status: AC
  Administered 2015-10-31: 500 mL via INTRAVENOUS

## 2015-10-31 MED ORDER — FENTANYL CITRATE (PF) 100 MCG/2ML IJ SOLN
50.0000 ug | Freq: Once | INTRAMUSCULAR | Status: AC
Start: 2015-10-31 — End: 2015-10-31
  Administered 2015-10-31: 50 ug via INTRAVENOUS
  Filled 2015-10-31: qty 2

## 2015-10-31 NOTE — ED Notes (Signed)
Pt sts migraine that started yesterday with hx of same. Sts some nausea.

## 2015-10-31 NOTE — Discharge Instructions (Signed)
You are having a headache. No specific cause was found today for your headache. It may have been a migraine or other cause of headache. Stress, anxiety, fatigue, and depression are common triggers for headaches. Your headache today does not appear to be life-threatening or require hospitalization, but often the exact cause of headaches is not determined in the emergency department. Therefore, follow-up with your doctor is very important to find out what may have caused your headache, and whether or not you need any further diagnostic testing or treatment. Sometimes headaches can appear benign (not harmful), but then more serious symptoms can develop which should prompt an immediate re-evaluation by your doctor or the emergency department. SEEK MEDICAL ATTENTION IF: You develop possible problems with medications prescribed.  The medications don't resolve your headache, if it recurs , or if you have multiple episodes of vomiting or can't take fluids. You have a change from the usual headache. RETURN IMMEDIATELY IF you develop a sudden, severe headache or confusion, become poorly responsive or faint, develop a fever above 100.54F or problem breathing, have a change in speech, vision, swallowing, or understanding, or develop new weakness, numbness, tingling, incoordination, or have a seizure.   Analgesic Rebound Headaches An analgesic rebound headache is a headache that returns after pain medicine (analgesic) that was taken to treat the initial headache wears off. People who suffer from tension, migraine, or cluster headaches are at risk for developing rebound headaches. Any type of primary headache can return as a rebound headache if you regularly take analgesics more than three times a week. If the cycle of rebound headaches continues, they become chronic daily headaches.  CAUSES Analgesics frequently associated with this problem include common over-the-counter medicines like aspirin, ibuprofen,  acetaminophen, sinus relief medicines, and other medicines that contain caffeine. Narcotic pain medicines are also a common cause of rebound headaches.  SIGNS AND SYMPTOMS The symptoms of rebound headaches are the same as the symptoms of your initial headache. Symptoms of specific types of headaches include: Tension headache  Pressure around the head.  Dull, aching head pain.  Pain felt over the front and sides of the head.  Tenderness in the muscles of the head, neck and shoulders. Migraine Headache  Pulsing or throbbing pain on one or both sides of the head.  Severe pain that interferes with daily activities.  Pain that is worsened by physical activity.  Nausea, vomiting, or both.  Pain with exposure to bright light, loud noises, or strong smells.  General sensitivity to bright light, loud noises, or strong smells.  Visual changes.  Numbness of one or both arms. Cluster Headaches  Severe pain that begins in or around one eye or temple.  Redness in the eye on the same side as the pain.  Droopy or swollen eyelid.  One-sided head pain.  Nausea.  Runny nose.  Sweaty, pale facial skin.  Restlessness. DIAGNOSIS  Analgesic rebound headaches are diagnosed by reviewing your medical history. This includes the nature of your initial headaches, as well as the type of pain medicines you have been using to treat your headaches and how often you take them. TREATMENT Discontinuing frequent use of the analgesic medicine will typically reduce the frequency of the rebound episodes. This may initially worsen your headaches but eventually the pain should become more manageable, less frequent, and less severe.  Seeing a headache specialists may helpful. He or she may be able to help you manage your headaches and to make sure there is not another cause  of the headaches. Alternative methods of stress relief such as acupuncture, counseling, biofeedback, and massage may also be helpful.  Talk with your health care provider about which alternative treatments might be good for you. HOME CARE INSTRUCTIONS Stopping the regular use of pain medicine can be difficult. Follow your health care provider's instructions carefully. Keep all of your appointments. Avoid triggers that are known to cause your primary headaches. SEEK MEDICAL CARE IF: You continue to experience headaches after following your health care provider's recommended treatments. SEEK IMMEDIATE MEDICAL CARE IF:  You develop new headache pain.  You develop headache pain that is different than what you have experienced in the past.  You develop numbness or tingling in your arms or legs.  You develop changes in your speech or vision. MAKE SURE YOU:  Understand these instructions.  Will watch your child's condition.  Will get help right away if your child is not doing well or gets worse.   This information is not intended to replace advice given to you by your health care provider. Make sure you discuss any questions you have with your health care provider.   Document Released: 03/01/2004 Document Revised: 12/31/2014 Document Reviewed: 06/25/2013 Elsevier Interactive Patient Education 2016 [place] Headache Without Cause A headache is pain or discomfort felt around the head or neck area. The specific cause of a headache may not be found. There are many causes and types of headaches. A few common ones are:  Tension headaches.  Migraine headaches.  Cluster headaches.  Chronic daily headaches. HOME CARE INSTRUCTIONS  Watch your condition for any changes. Take these steps to help with your condition: Managing Pain  Take over-the-counter and prescription medicines only as told by your health care provider.  Lie down in a dark, quiet room when you have a headache.  If directed, apply ice to the head and neck area:  Put ice in a plastic bag.  Place a towel between your skin and the  bag.  Leave the ice on for 20 minutes, 2-3 times per day.  Use a heating pad or hot shower to apply heat to the head and neck area as told by your health care provider.  Keep lights dim if bright lights bother you or make your headaches worse. Eating and Drinking  Eat meals on a regular schedule.  Limit alcohol use.  Decrease the amount of caffeine you drink, or stop drinking caffeine. General Instructions  Keep all follow-up visits as told by your health care provider. This is important.  Keep a headache journal to help find out what may trigger your headaches. For example, write down:  What you eat and drink.  How much sleep you get.  Any change to your diet or medicines.  Try massage or other relaxation techniques.  Limit stress.  Sit up straight, and do not tense your muscles.  Do not use tobacco products, including cigarettes, chewing tobacco, or e-cigarettes. If you need help quitting, ask your health care provider.  Exercise regularly as told by your health care provider.  Sleep on a regular schedule. Get 7-9 hours of sleep, or the amount recommended by your health care provider. SEEK MEDICAL CARE IF:   Your symptoms are not helped by medicine.  You have a headache that is different from the usual headache.  You have nausea or you vomit.  You have a fever. SEEK IMMEDIATE MEDICAL CARE IF:   Your headache becomes severe.  You have repeated vomiting.  You  have a stiff neck.  You have a loss of vision.  You have problems with speech.  You have pain in the eye or ear.  You have muscular weakness or loss of muscle control.  You lose your balance or have trouble walking.  You feel faint or pass out.  You have confusion.   This information is not intended to replace advice given to you by your health care provider. Make sure you discuss any questions you have with your health care provider.   Document Released: 12/10/2005 Document Revised:  08/31/2015 Document Reviewed: 04/04/2015 Elsevier Interactive Patient Education [employer].

## 2015-10-31 NOTE — ED Provider Notes (Signed)
CSN: [id number]     Arrival date & time 10/31/15  0935 History   First MD Initiated Contact with Patient 10/31/15 [phone]     Chief Complaint  Patient presents with  . Migraine     (Consider location/radiation/quality/duration/timing/severity/associated sxs/prior Treatment) HPI   This is a 64 year old female with a past medical history of type 2 diabetes, hypertension, reflux, chronic migraines, and history of grand mal seizures. The patient presents emergency Department with chief complaint of headache. She describes it as left sided, throbbing, radiating from behind her left eye into her occiput. She currently rates her pain at a 1-2 out of 10. She states that she came today because her headache has not gone away with her normal treatments. She states that her pain yesterday was 10 out of 10 and that she was up frequently at night with her headache pain. She endorses photophobia, phonophobia, and some intermittent nausea. Denies visual changes, stiff neck, neck pain, rash, or "thunderclap" onset. Denies unilateral weakness, facial asymmetry, difficulty with speech, change in gait, or vertigo.   Past Medical History  Diagnosis Date  . High cholesterol   . Hypertension   . Type II diabetes mellitus ([place]) dx'd ~ 2014  . GERD (gastroesophageal reflux disease)   . Migraine     "daily" (06/16/2015)  . Epilepsy, grand mal ([place])     "last seizure ~ 2001" (06/16/2015)   Past Surgical History  Procedure Laterality Date  . Laparoscopic incisional / umbilical / ventral hernia repair  06/16/2015    IHR w/mesh  . Hernia repair    . Vaginal hysterectomy  ~ 1992  . Incisional hernia repair N/A 06/16/2015    Procedure: LAPAROSCOPIC INCISIONAL HERNIA WITH MESH;  Surgeon: Coralie Keens, MD;  Location: [facility];  Service: General;  Laterality: N/A;  . Insertion of mesh N/A 06/16/2015    Procedure: INSERTION OF MESH;  Surgeon: Coralie Keens, MD;  Location[facility] OR;  Service: General;  Laterality: N/A;    Family History  Problem Relation Age of Onset  . Lung cancer Mother   . Hypertension Other   . Kidney failure Father    Social History  Substance Use Topics  . Smoking status: Never Smoker   . Smokeless tobacco: Never Used  . Alcohol Use: No   OB History    No data available     Review of Systems  Ten systems reviewed and are negative for acute change, except as noted in the HPI.    Allergies  Nsaids and Aspirin  Home Medications   Prior to Admission medications   Medication Sig Start Date End Date Taking? Authorizing Provider  atorvastatin (LIPITOR) 40 MG tablet Take 40 mg by mouth daily.   Yes Historical Provider, MD  BUTRANS 7.5 MCG/HR PTWK Place 1 patch onto the skin once a week. Saturdays 10/25/15  Yes Historical Provider, MD  calcium-vitamin D (OSCAL WITH D) 250-125 MG-UNIT per tablet Take 1 tablet by mouth daily.   Yes Historical Provider, MD  divalproex (DEPAKOTE ER) 500 MG 24 hr tablet 1 tablet the morning, 2 tablets in the evening Patient taking differently: Take 500 mg by mouth 2 (two) times daily.  08/15/15  Yes Marcial Pacas, MD  eletriptan (RELPAX) 40 MG tablet Take 1 tablet (40 mg total) by mouth as needed for migraine or headache. May repeat in 2 hours if headache persists or recurs. 01/12/15  Yes Marcial Pacas, MD  esomeprazole (NEXIUM) 40 MG capsule Take 40 mg by mouth daily at  12 noon.  07/13/15  Yes Historical Provider, MD  ferrous sulfate 325 (65 FE) MG tablet Take 325 mg by mouth daily with breakfast.   Yes Historical Provider, MD  HORIZANT 600 MG TBCR Take 600 mg by mouth every evening. 04/16/15  Yes Historical Provider, MD  Linaclotide (LINZESS) 290 MCG CAPS capsule Take 290 mcg by mouth daily.   Yes Historical Provider, MD  magnesium oxide (MAG-OX) 400 MG tablet Take 400 mg by mouth 2 (two) times daily.    Yes Historical Provider, MD  metoCLOPramide (REGLAN) 5 MG tablet Take 5 mg by mouth 3 (three) times daily as needed for nausea.   Yes Historical Provider, MD   Multiple Vitamin (MULTIVITAMIN WITH MINERALS) TABS Take 1 tablet by mouth daily.   Yes Historical Provider, MD  olmesartan (BENICAR) 40 MG tablet Take 40 mg by mouth daily.   Yes Historical Provider, MD  tiZANidine (ZANAFLEX) 2 MG tablet TAKE 1 TABLET BY MOUTH  TWICE A DAY 10/05/15  Yes Marcial Pacas, MD  topiramate (TOPAMAX) 100 MG tablet Take 1 tablet (100 mg total) by mouth daily. 07/14/15  Yes Dennie Bible, NP  traMADol (ULTRAM) 50 MG tablet Take 50 mg by mouth 3 (three) times daily as needed (pain).  05/02/15  Yes Historical Provider, MD  traMADol (ULTRAM-ER) 100 MG 24 hr tablet Take 100 mg by mouth every evening.  05/18/15  Yes Historical Provider, MD  verapamil (VERELAN PM) 240 MG 24 hr capsule Take 240 mg by mouth at bedtime.  09/07/15  Yes Historical Provider, MD  HYDROcodone-acetaminophen (NORCO) 5-325 MG tablet Take 1 tablet by mouth every 6 (six) hours as needed for moderate pain or severe pain. 10/31/15   Geraldo Haris, PA-C   BP 149/57 mmHg  Pulse 77  Temp(Src) 98 F (36.7 C)  Resp 14  SpO2 99% Physical Exam  Constitutional: She is oriented to person, place, and time. She appears well-developed and well-nourished. No distress.  HENT:  Head: Normocephalic and atraumatic.  Mouth/Throat: Oropharynx is clear and moist.  Eyes: Conjunctivae and EOM are normal. Pupils are equal, round, and reactive to light. No scleral icterus.  No horizontal, vertical or rotational nystagmus  Neck: Normal range of motion. Neck supple.  Full active and passive ROM without pain No midline or paraspinal tenderness No nuchal rigidity or meningeal signs  Cardiovascular: Normal rate, regular rhythm and intact distal pulses.   Pulmonary/Chest: Effort normal and breath sounds normal. No respiratory distress. She has no wheezes. She has no rales.  Abdominal: Soft. Bowel sounds are normal. There is no tenderness. There is no rebound and no guarding.  Musculoskeletal: Normal range of motion.   Lymphadenopathy:    She has no cervical adenopathy.  Neurological: She is alert and oriented to person, place, and time. She has normal reflexes. No cranial nerve deficit. She exhibits normal muscle tone. Coordination normal.  Mental Status:  Alert, oriented, thought content appropriate. Speech fluent without evidence of aphasia. Able to follow 2 step commands without difficulty.  Cranial Nerves:  II:  Peripheral visual fields grossly normal, pupils equal, round, reactive to light III,IV, VI: ptosis not present, extra-ocular motions intact bilaterally  V,VII: smile symmetric, facial light touch sensation equal VIII: hearing grossly normal bilaterally  IX,X: midline uvula rise  XI: bilateral shoulder shrug equal and strong XII: midline tongue extension  Motor:  5/5 in upper and lower extremities bilaterally including strong and equal grip strength and dorsiflexion/plantar flexion Sensory: Pinprick and light touch normal in all  extremities.  Deep Tendon Reflexes: 2+ and symmetric  Cerebellar: normal finger-to-nose with bilateral upper extremities Gait: normal gait and balance CV: distal pulses palpable throughout   Skin: Skin is warm and dry. No rash noted. She is not diaphoretic.  Psychiatric: She has a normal mood and affect. Her behavior is normal. Judgment and thought content normal.  Nursing note and vitals reviewed.   ED Course  Procedures (including critical care time) Labs Review Labs Reviewed  CBG MONITORING, ED    Imaging Review No results found. I have personally reviewed and evaluated these images and lab results as part of my medical decision-making.   EKG Interpretation None      MDM   Final diagnoses:  Bad headache    12:07 PM BP 149/57 mmHg  Pulse 77  Temp(Src) 98 F (36.7 C)  Resp 14  SpO2 99% Patient with migraine.  no concerning sxs. Will treat pain.   12:07 PM BP 149/57 mmHg  Pulse 77  Temp(Src) 98 F (36.7 C)  Resp 14  SpO2 99% cbg  82 Pt HA treated and improved while in ED.  Presentation is like pts typical HA and non concerning for [facility], ICH, Meningitis, or temporal arteritis. Pt is afebrile with no focal neuro deficits, nuchal rigidity, or change in vision. Pt is to follow up with PCP to discuss prophylactic medication. Pt verbalizes understanding and is agreeable with plan to dc.      Margarita Mail, PA-C 10/31/15 [place], MD 10/31/15 [phone]

## 2015-11-01 ENCOUNTER — Other Ambulatory Visit: Payer: Self-pay | Admitting: Nurse Practitioner

## 2015-11-01 MED ORDER — DICLOFENAC POTASSIUM(MIGRAINE) 50 MG PO PACK: 50.0000 mg | PACK | ORAL | Status: DC | PRN

## 2015-11-01 NOTE — Telephone Encounter (Signed)
So first of all Hydrocodone is not her acute medication Relpax is and it does not appear from the message she took that. Steroids would be avoided because she is diabetic and she is already on Depakote which can be increased. She is not taking what I have prescribed according to med list.

## 2015-11-01 NOTE — Telephone Encounter (Signed)
Spoke to pt.  She is taking depakote 500mg  po bid.  She took relpax (total 2 tabs today), which she stated did not help.  Her headache is now level 7 (has come back over the past hour).  The hydrocodone was given to her in the ED (10-31-15).

## 2015-11-01 NOTE — Telephone Encounter (Signed)
Discussed with Dr. [name] Patient has failed Imitrex Maxalt Zomig and now Relpax. Dr [name] suggests Cambia and Botox injections. I will call in RX .If she wants Botox need PA Where does she want RX sent. [facility] [place]

## 2015-11-01 NOTE — Telephone Encounter (Signed)
Pt's husband called sts HA's started 10/29/15-went ED on 10/31/15 (Monday), gave injection, HA stopped. This morning the HA started again, she has taken 1 dose of hydrocodone and daily medications with no relief. He is inquiring if something can be called into pharmacy to stop HA.

## 2015-11-01 NOTE — Telephone Encounter (Signed)
I spoke with pt and relayed the message below.  She stated she had botox previously when she was seeing Dr. Erling Cruz and this did not work  (back in 2011 Dr Doy Mince).   She has not tried [place].   I told her that sending prescription for cambia to use for acute headache.   Not to use with triptans.  (one or the other).  She verbalized understanding.   Hopefully this works.   She will let us know.   [facility] [address] I sent prescription.  Done.

## 2015-11-01 NOTE — Telephone Encounter (Signed)
I spoke to pt.  She as per below went to ED. Had migraine cocktail which helped her.  This am headache has come back Level 5.  She has taken her daily meds and some hydrocodone that she took and when I called her she said her headache was gone.  It took a while for it to go away.   She has some associated nausea with her migraine.   She is asking for additional medication to take if her headache comes back.  She has not had depacon or steroids.   Is a type 2 diabetic.  I told her I would relay message.

## 2015-11-02 ENCOUNTER — Telehealth: Payer: Self-pay | Admitting: Neurology

## 2015-11-02 NOTE — Telephone Encounter (Signed)
Patient called to advise she was prescribed Diclofenac Potassium (CAMBIA) 50 MG PACK for headaches however she discovered by reading information from Pharmacy that this is a anti-inflammatory drug and she can't take anti-inflammatory drugs.

## 2015-11-02 NOTE — Telephone Encounter (Signed)
She is concerned about ulcers and would like to try an alternate medication.

## 2015-11-03 DIAGNOSIS — Z780 Asymptomatic menopausal state: Secondary | ICD-10-CM | POA: Diagnosis not present

## 2015-11-03 DIAGNOSIS — M8589 Other specified disorders of bone density and structure, multiple sites: Secondary | ICD-10-CM | POA: Diagnosis not present

## 2015-11-03 NOTE — Telephone Encounter (Signed)
Husband Elenore Rota called back, states no one ever returned call from yesterday.

## 2015-11-03 NOTE — Telephone Encounter (Signed)
She had an appt scheduled on 11/07/15 for a 30 minute follow up appt.  [name] has requested we provide her with a one hour appt.  I have canceled Monday's appt and left her a message on home and cell numbers to please call back to reschedule.

## 2015-11-03 NOTE — Telephone Encounter (Signed)
Dr. [name] has reviewed patient's chart - she has tried multiple medications in the past.  An appointment has been scheduled with Hoyle Sauer to discuss the next step in her treatment plan.

## 2015-11-04 NOTE — Telephone Encounter (Signed)
Scheduled 11-08-15 at 0900 for one hour.

## 2015-11-07 ENCOUNTER — Ambulatory Visit: Payer: Self-pay | Admitting: Nurse Practitioner

## 2015-11-08 ENCOUNTER — Encounter: Payer: Self-pay | Admitting: Nurse Practitioner

## 2015-11-08 ENCOUNTER — Ambulatory Visit (INDEPENDENT_AMBULATORY_CARE_PROVIDER_SITE_OTHER): Payer: Medicare Other | Admitting: Nurse Practitioner

## 2015-11-08 ENCOUNTER — Other Ambulatory Visit: Payer: Self-pay | Admitting: *Deleted

## 2015-11-08 VITALS — BP 146/69 | HR 65 | Wt 125.6 lb

## 2015-11-08 DIAGNOSIS — R51 Headache: Secondary | ICD-10-CM | POA: Diagnosis not present

## 2015-11-08 DIAGNOSIS — F32A Depression, unspecified: Secondary | ICD-10-CM

## 2015-11-08 DIAGNOSIS — G8929 Other chronic pain: Secondary | ICD-10-CM

## 2015-11-08 DIAGNOSIS — R250 Abnormal head movements: Secondary | ICD-10-CM

## 2015-11-08 DIAGNOSIS — F329 Major depressive disorder, single episode, unspecified: Secondary | ICD-10-CM

## 2015-11-08 MED ORDER — SERTRALINE HCL 25 MG PO TABS: 25.0000 mg | ORAL_TABLET | Freq: Every day | ORAL | Status: DC

## 2015-11-08 MED ORDER — ONDANSETRON HCL 4 MG PO TABS: 4.0000 mg | ORAL_TABLET | Freq: Three times a day (TID) | ORAL | Status: DC | PRN

## 2015-11-08 NOTE — Progress Notes (Signed)
[facility]  PATIENT: [name] DOB: 11/21/1951   REASON FOR VISIT: Follow-up for worsening headaches, new onset depression and abnormal facial movements HISTORY FROM: Patient and husband    HISTORY OF PRESENT ILLNESS: HISTORY: Right-handed African American married female with daily left hemicranial headaches lasting 30 minutes to 1-1/2 hours usually occurring 5 or 6 times per day, previous patient of Dr. Erling Cruz.  Her headaches are always on the left side. She also has a history of complex partial Seizures, last seizure was 2007.  She had an MRI studies of her brain 05/2009, 09/15/09, and 07/02/12 showing stable en plaque meningiomas, one over the left lateral convexity and the other in the middle of the anterior fossa.  There is improvement in the frequency and severity of her headaches with the use of Indomethacin . It cuts down her headaches approximately 75%. Indomethacin is associated with elevated creatinine. She is followed by Dr.Webb. Her creatinine runs 1.19. Because of this she discontinued her indomethacin .  She tried Botox by Dr. Tyron Russell and [place] without benefit . Nasal oxygen increases the speed with which her headache pain relief occurs. She developed headaches in February 2012 and went to the emergency room. She went to the emergency room 10/19/213 with headaches. She continued to have headaches after her ER visit. They are worse at night than during the day. They are not associated with eyelid droop, nasal stuffiness, or tearing. She has gained weight on divalproex sodium.  Update 07/14/2015: [name], 64 year old female returns for follow-up. She continues to have frequent headaches almost on a daily basis and is on polypharmacy to include Depakote Topamax tizanidine magnesium oxide verapamil and Relpax. She woke up with a headache this morning however she did not take her Relpax. Her husband reports that she snores at night. She complains of  significant daytime drowsiness and scores 15 on ESS. She also complains of frequent awakening at night. She is wanting to decrease her Topamax as she has not had a seizure in many years. With the frequency of her headaches I'm not sure her medications are working for her anyway. She returns for reevaluation UPDATE November 08 2015: [name] 64 year old female returns for follow-up. She has a history of intractable headaches always occurring on the left side. She was originally evaluated at this office in 2001 by Dr. Erling Cruz . She has been tried on multiple medication regimens including Botox  in the past see below. She continues to have 5-6 headaches per week. Her headaches might last 1-2 hours then reoccur during the day. She had a sleep study after my last visit with her which was negative for obstructive sleep apnea She went to the emergency room November 7 and received a migraine cocktail with relief. She had a headache the  next day and took hydrocodone. She claims Relpax no longer works for her acutely. She is currently on Topamax Depakote verapamil and tizanidine as preventatives. She has also been on Reglan for many years and I noticed some abnormal movements of her face today. She apparently had a headache yesterday and took Tylenol Extra Strength the headache went away but she woke up with a headache this morning. Her headache today is rated between 8 and 9 on the pain scale .She did not take Relpax instead took tizanidine with no relief. On questioning her about her Tylenol she takes it on a daily basis 2 to 4 tabs  she is probably getting some rebound effect.  She has chronic renal disease most recent creatinine 1.21 Her review of medications that have been tried in the past include the following. Analgesic, Tylenol Percocet Demerol Stadol Tylenol 3, Tylenol No. 4, Ultram Dilaudid and Norco Acute meds Amerge, D.H.E. Imitrex spray Maxalt Migranal Sansert, Zomig and Relpax Antihypertensives atenolol,  Calan, currently on verapamil Antinausea Phenergan Reglan Zofran Anti-inflammatory Indocin Celebrex diclofenac Vioxx Muscle relaxants baclofen Robaxin Flexeril currently on Zanaflex Anticonvulsants Lamictal gabapentin Lyrica Keppra and Zonegran, currently on Depakote Topamax and Horizant Steroids prednisone Tranquilizers/ sleepers Thorazine Zyprexa and Vistaril Antidepressant Elavil lithium Paxil Prozac and imipramine  Other currently on magnesium  She returns for reevaluation   REVIEW OF SYSTEMS: Full 14 system review of systems performed and notable only for those listed, all others are neg:  Constitutional: neg  Cardiovascular: neg Ear/Nose/Throat: neg  Skin: neg Eyes: neg Respiratory: neg Gastroitestinal: diarrhea Hematology/Lymphatic: neg  Endocrine: neg Musculoskeletal:neg Allergy/Immunology: neg NeurologicalHeadache, tremors Psychiatric: neg Sleep : neg   ALLERGIES: Allergies  Allergen Reactions  . Nsaids Other (See Comments)    Can cause ulcers  . Aspirin Other (See Comments) and Nausea Only    Could cause ulcers Could cause ulcers Could cause ulcers    HOME MEDICATIONS: Outpatient Prescriptions Prior to Visit  Medication Sig Dispense Refill  . atorvastatin (LIPITOR) 40 MG tablet Take 40 mg by mouth daily.    Marland Kitchen BUTRANS 7.5 MCG/HR PTWK Place 1 patch onto the skin once a week. Saturdays  5  . calcium-vitamin D (OSCAL WITH D) 250-125 MG-UNIT per tablet Take 1 tablet by mouth daily.    . divalproex (DEPAKOTE ER) 500 MG 24 hr tablet 1 tablet the morning, 2 tablets in the evening (Patient taking differently: Take 500 mg by mouth 2 (two) times daily. ) 270 tablet 1  . esomeprazole (NEXIUM) 40 MG capsule Take 40 mg by mouth daily at 12 noon.     . ferrous sulfate 325 (65 FE) MG tablet Take 325 mg by mouth daily with breakfast.    . HORIZANT 600 MG TBCR Take 600 mg by mouth every evening.  11  . HYDROcodone-acetaminophen (NORCO) 5-325 MG tablet Take 1 tablet by mouth  every 6 (six) hours as needed for moderate pain or severe pain. 10 tablet 0  . Linaclotide (LINZESS) 290 MCG CAPS capsule Take 290 mcg by mouth daily.    . magnesium oxide (MAG-OX) 400 MG tablet Take 400 mg by mouth 2 (two) times daily.     . Multiple Vitamin (MULTIVITAMIN WITH MINERALS) TABS Take 1 tablet by mouth daily.    Marland Kitchen olmesartan (BENICAR) 40 MG tablet Take 40 mg by mouth daily.    Marland Kitchen tiZANidine (ZANAFLEX) 2 MG tablet TAKE 1 TABLET BY MOUTH  TWICE A DAY 180 tablet 1  . topiramate (TOPAMAX) 100 MG tablet Take 1 tablet (100 mg total) by mouth daily. 90 tablet 2  . traMADol (ULTRAM) 50 MG tablet Take 50 mg by mouth 3 (three) times daily as needed (pain).   4  . traMADol (ULTRAM-ER) 100 MG 24 hr tablet Take 100 mg by mouth every evening.   0  . verapamil (VERELAN PM) 240 MG 24 hr capsule Take 240 mg by mouth at bedtime.     . Diclofenac Potassium (CAMBIA) 50 MG PACK Take 50 mg by mouth as needed. Acute headache 6 each 0  . eletriptan (RELPAX) 40 MG tablet Take 1 tablet (40 mg total) by mouth as needed for migraine or headache. May repeat in 2 hours  if headache persists or recurs. 15 tablet 11  . metoCLOPramide (REGLAN) 5 MG tablet Take 5 mg by mouth 3 (three) times daily as needed for nausea.     No facility-administered medications prior to visit.    PAST MEDICAL HISTORY: Past Medical History  Diagnosis Date  . High cholesterol   . Hypertension   . Type II diabetes mellitus ([place]) dx'd ~ 2014  . GERD (gastroesophageal reflux disease)   . Migraine     "daily" (06/16/2015)  . Epilepsy, grand mal ([place])     "last seizure ~ 2001" (06/16/2015)    PAST SURGICAL HISTORY: Past Surgical History  Procedure Laterality Date  . Laparoscopic incisional / umbilical / ventral hernia repair  06/16/2015    IHR w/mesh  . Hernia repair    . Vaginal hysterectomy  ~ 1992  . Incisional hernia repair N/A 06/16/2015    Procedure: LAPAROSCOPIC INCISIONAL HERNIA WITH MESH;  Surgeon: Coralie Keens, MD;   Location: [facility];  Service: General;  Laterality: N/A;  . Insertion of mesh N/A 06/16/2015    Procedure: INSERTION OF MESH;  Surgeon: Coralie Keens, MD;  Location: [facility];  Service: General;  Laterality: N/A;    FAMILY HISTORY: Family History  Problem Relation Age of Onset  . Lung cancer Mother   . Hypertension Other   . Kidney failure Father     SOCIAL HISTORY: Social History   Social History  . Marital Status: Married    Spouse Name: Elenore Rota  . Number of Children: 3  . Years of Education: college   Occupational History  .      does not work   Social History Main Topics  . Smoking status: Never Smoker   . Smokeless tobacco: Never Used  . Alcohol Use: No  . Drug Use: No  . Sexual Activity: Not on file   Other Topics Concern  . Not on file   Social History Narrative   Patient lives at home with her husband Elenore Rota). Patient was a homemaker.   Right handed.   College education.   Caffeine- One cup of coffee daily.   Patient has three children.     PHYSICAL EXAM  Filed Vitals:   11/08/15 0857  BP: 146/69  Pulse: 65  Weight: 125 lb 9.6 oz (56.972 kg)   Body mass index is 23.74 kg/(m^2). Generalized: Well developed, has migrainetoday 8-9 on pain scale Headnormocephalic and atraumatic,. Oropharynx benign  Neck: Supple, no carotid bruits  Cardiac: Regular rate rhythm, no murmur  Musculoskeletal: No deformity   Neurological examination   Mentation: Alert oriented to time, place, history taking. Follows all commands speech and language fluent, depressed looking elderly female GDS = 6 which is suggestive of depression  Cranial nerve II-XII:  Funduscopic deferred due to migraine Pupils were equal round reactive to light extraocular movements were full, visual field were full on confrontational test. Facial sensation and strength were normal. hearing was intact to finger rubbing bilaterally. Uvula tongue midline. head turning and shoulder shrug were normal and  symmetric.Tongue protrusion into cheek strength was normal. Motor: normal bulk and tone, full strength in the BUE, BLE, fine finger movements normal, no pronator drift. No focal weakness Sensory: normal and symmetric to light touch, pinprick, and vibration  Coordination: finger-nose-finger, heel-to-shin bilaterally, no dysmetria Reflexes: Brachioradialis 2/2, biceps 2/2, triceps 2/2, patellar 2/2, Achilles 2/2, plantar responses were flexor bilaterally. Gait and Station: Rising up from seated position without assistance, normal stance, moderate stride, good arm swing, smooth  turning, able to perform tiptoe, and heel walking without difficulty. Tandem gait is steady  DIAGNOSTIC DATA (LABS, IMAGING, TESTING) - I reviewed patient records, labs, notes, testing and imaging myself where available.  Lab Results  Component Value Date   WBC 8.0 06/08/2015   HGB 12.2 06/08/2015   HCT 38.0 06/08/2015   MCV 89.6 06/08/2015   PLT 262 06/08/2015      Component Value Date/Time   NA 140 06/08/2015 1328   K 4.4 06/08/2015 1328   CL 104 06/08/2015 1328   CO2 26 06/08/2015 1328   GLUCOSE 78 06/08/2015 1328   BUN 24* 06/08/2015 1328   CREATININE 1.21* 06/08/2015 1328   CALCIUM 9.6 06/08/2015 1328   PROT 7.6 10/25/2008 1155   ALBUMIN 4.6 10/25/2008 1155   AST 16 10/25/2008 1155   ALT 12 10/25/2008 1155   ALKPHOS 52 10/25/2008 1155   BILITOT 0.4 10/25/2008 1155   GFRNONAA 7* 06/08/2015 1328   GFRAA 53* 06/08/2015 1328    Lab Results  Component Value Date   HGBA1C 6.3* 06/08/2015    ASSESSMENT AND PLAN  64 y.o. year old female  has a past medical history of High cholesterol; Hypertension; Type II diabetes mellitus ([place]) (dx'd ~ 2014); GERD (gastroesophageal reflux disease); Migraine; and Epilepsy, grand mal ([place]). Here to follow up.   Stop tylenol due to rebound given written information on rebound headaches  Begin Zoloft 25 mg for depression for geriatric depression scale of 6  indicative of depression  Begin Riboflavin 200mg  twice daily OTC Continue magnesium 400 mg twice daily  Onzetra spray acutely prn Continue Topamax at 100 mg daily as preventive Continue depakote 500 mg 1 AM 2 PM as preventive Continue   tizanidine 2 mg twice daily as preventive Continue verapamil  240 daily as preventives  Stop Reglan due to abnormal facial movements Begin Zofran 4 mg PO  when necessary for nausea Migraine cocktail now (Depakon 1 gm , Compazine 10mg  and Torodol 30mg ) F/U in 2 months with Dr. [name] Vst time 1 hour face to face reviewing previous meds and current treatment options which are few.Made aware she may have to be referred to [facility].  Dennie Bible, GNP, [facility], APRN Late note headache gone after migraine coctail [facility] [address] [place],  [address] [phone]

## 2015-11-08 NOTE — Progress Notes (Unsigned)
I called pt and got verbal ok for fax information to [facility].  Need printed prescription to get first 30day free supply.  Will fax both enrollment and prescription to Sherril Cong at [phone].  Pt also stated that she used sample of medication when her headache started to come back this afternoon.  I relayed to pt that can give one more sample and then get free first 30 day supply.  She verbalized understanding.  Her other meds zofran and zoloft called in.

## 2015-11-08 NOTE — Patient Instructions (Addendum)
Stop tylenol due to rebound Zoloft 25 mg for depression Riboflavin 200mg  twice daily Onzetra spray acutely Continue Topamax, depakote tizanidine and verapamil as preventives. Migraine cocktail now F/U in 2 months with Dr. [name]

## 2015-11-09 ENCOUNTER — Telehealth: Payer: Self-pay | Admitting: Neurology

## 2015-11-09 ENCOUNTER — Other Ambulatory Visit: Payer: Self-pay | Admitting: Nurse Practitioner

## 2015-11-09 MED ORDER — SUMATRIPTAN SUCCINATE 11 MG/NOSEPC NA EXHP: 11.0000 mg | INHALANT_POWDER | NASAL | Status: DC | PRN

## 2015-11-09 MED ORDER — SUMATRIPTAN SUCCINATE 11 MG/NOSEPC NA EXHP: 11.0000 mg | INHALANT_POWDER | Freq: Once | NASAL | Status: DC

## 2015-11-09 NOTE — Telephone Encounter (Signed)
I called the patient who said she has already used sample of Onzetra given yesterday and wants more samples.  She would also like [facility] Rx [facility] to [facility].

## 2015-11-09 NOTE — Telephone Encounter (Signed)
LMVM for pt on home # that of below.  Her zoloft and zofran prescriptions were called to pharmacy as well.   I told her to call back as needed.   Hope she was doing well.

## 2015-11-09 NOTE — Telephone Encounter (Signed)
Patient is calling. She was seen in our office yesterday and given a sample of Onzetra nasal spray. The patient would like to get another sample of Onzetra and also have [facility] [address]. Please call patient and advise. Thank you.

## 2015-11-09 NOTE — Telephone Encounter (Signed)
Jane with [employer] called sts prior Josem Kaufmann has been was approved and copay is $15. She sts RX can be sent to phamacy. She can be reached at [phone]

## 2015-11-09 NOTE — Telephone Encounter (Signed)
Spoke with Hoyle Sauer who said [name] is working on prior [employer] for [employer] through [employer]. I called back and relayed this info to patient.  Unfortunately we do not have additional samples at this time.  Patient expressed understanding.

## 2015-11-16 NOTE — Progress Notes (Signed)
I have reviewed and agreed above plan. 

## 2015-11-22 DIAGNOSIS — M25551 Pain in right hip: Secondary | ICD-10-CM | POA: Diagnosis not present

## 2015-11-23 DIAGNOSIS — G4723 Circadian rhythm sleep disorder, irregular sleep wake type: Secondary | ICD-10-CM | POA: Diagnosis not present

## 2015-11-23 DIAGNOSIS — G44049 Chronic paroxysmal hemicrania, not intractable: Secondary | ICD-10-CM | POA: Diagnosis not present

## 2015-12-06 DIAGNOSIS — M8588 Other specified disorders of bone density and structure, other site: Secondary | ICD-10-CM | POA: Diagnosis not present

## 2015-12-08 DIAGNOSIS — M7061 Trochanteric bursitis, right hip: Secondary | ICD-10-CM | POA: Diagnosis not present

## 2015-12-08 DIAGNOSIS — M545 Low back pain: Secondary | ICD-10-CM | POA: Diagnosis not present

## 2015-12-08 DIAGNOSIS — M5441 Lumbago with sciatica, right side: Secondary | ICD-10-CM | POA: Diagnosis not present

## 2015-12-09 DIAGNOSIS — R29898 Other symptoms and signs involving the musculoskeletal system: Secondary | ICD-10-CM | POA: Diagnosis not present

## 2015-12-12 ENCOUNTER — Other Ambulatory Visit: Payer: Self-pay | Admitting: Family Medicine

## 2015-12-12 DIAGNOSIS — R29898 Other symptoms and signs involving the musculoskeletal system: Secondary | ICD-10-CM

## 2015-12-15 ENCOUNTER — Other Ambulatory Visit: Payer: Self-pay | Admitting: Gastroenterology

## 2015-12-15 DIAGNOSIS — R109 Unspecified abdominal pain: Secondary | ICD-10-CM | POA: Diagnosis not present

## 2015-12-15 DIAGNOSIS — K5900 Constipation, unspecified: Secondary | ICD-10-CM | POA: Diagnosis not present

## 2015-12-15 DIAGNOSIS — D509 Iron deficiency anemia, unspecified: Secondary | ICD-10-CM | POA: Diagnosis not present

## 2015-12-15 DIAGNOSIS — D649 Anemia, unspecified: Secondary | ICD-10-CM

## 2015-12-20 ENCOUNTER — Telehealth: Payer: Self-pay | Admitting: Neurology

## 2015-12-20 DIAGNOSIS — R269 Unspecified abnormalities of gait and mobility: Secondary | ICD-10-CM | POA: Diagnosis not present

## 2015-12-20 DIAGNOSIS — R531 Weakness: Secondary | ICD-10-CM | POA: Diagnosis not present

## 2015-12-20 MED ORDER — DIVALPROEX SODIUM ER 500 MG PO TB24: ORAL_TABLET | ORAL | Status: DC

## 2015-12-20 NOTE — Telephone Encounter (Signed)
Pt needs a 14 day supply of divalproex (DEPAKOTE ER) 500 MG 24 hr tablet , her mail order does not when they will ship it to her.

## 2015-12-21 ENCOUNTER — Ambulatory Visit
Admission: RE | Admit: 2015-12-21 | Discharge: 2015-12-21 | Disposition: A | Discharge status: Home / Self Care | Payer: Medicare Other | Source: Ambulatory Visit | Attending: Family Medicine | Admitting: Family Medicine

## 2015-12-21 DIAGNOSIS — R262 Difficulty in walking, not elsewhere classified: Secondary | ICD-10-CM | POA: Diagnosis not present

## 2015-12-21 DIAGNOSIS — R29898 Other symptoms and signs involving the musculoskeletal system: Secondary | ICD-10-CM

## 2015-12-21 DIAGNOSIS — D329 Benign neoplasm of meninges, unspecified: Secondary | ICD-10-CM | POA: Diagnosis not present

## 2015-12-21 MED ORDER — GADOBENATE DIMEGLUMINE 529 MG/ML IV SOLN
10.0000 mL | Freq: Once | INTRAVENOUS | Status: AC | PRN
  Administered 2015-12-21: 10 mL via INTRAVENOUS

## 2015-12-27 ENCOUNTER — Ambulatory Visit
Admission: RE | Admit: 2015-12-27 | Discharge: 2015-12-27 | Disposition: A | Discharge status: Home / Self Care | Payer: Medicare Other | Source: Ambulatory Visit | Attending: Gastroenterology | Admitting: Gastroenterology

## 2015-12-27 DIAGNOSIS — K8020 Calculus of gallbladder without cholecystitis without obstruction: Secondary | ICD-10-CM | POA: Diagnosis not present

## 2015-12-27 DIAGNOSIS — D649 Anemia, unspecified: Secondary | ICD-10-CM

## 2015-12-27 DIAGNOSIS — R1030 Lower abdominal pain, unspecified: Secondary | ICD-10-CM | POA: Diagnosis not present

## 2015-12-27 DIAGNOSIS — R109 Unspecified abdominal pain: Secondary | ICD-10-CM

## 2015-12-27 DIAGNOSIS — K5900 Constipation, unspecified: Secondary | ICD-10-CM

## 2015-12-27 MED ORDER — IOPAMIDOL (ISOVUE-300) INJECTION 61%
100.0000 mL | Freq: Once | INTRAVENOUS | Status: AC | PRN
  Administered 2015-12-27: 100 mL via INTRAVENOUS

## 2015-12-28 ENCOUNTER — Telehealth: Payer: Self-pay | Admitting: Neurology

## 2015-12-28 MED ORDER — DIVALPROEX SODIUM ER 500 MG PO TB24: ORAL_TABLET | ORAL | Status: DC

## 2015-12-28 NOTE — Telephone Encounter (Signed)
Pt needs refill on divalproex (DEPAKOTE ER) 500 MG 24 hr tablet, 90 day supply with 3 refills please. [facility]. Thank you

## 2015-12-29 DIAGNOSIS — M545 Low back pain: Secondary | ICD-10-CM | POA: Diagnosis not present

## 2015-12-30 ENCOUNTER — Ambulatory Visit
Admission: RE | Admit: 2015-12-30 | Discharge: 2015-12-30 | Disposition: A | Discharge status: Home / Self Care | Payer: Medicare Other | Source: Ambulatory Visit | Attending: Family Medicine | Admitting: Family Medicine

## 2015-12-30 ENCOUNTER — Other Ambulatory Visit: Payer: Self-pay | Admitting: Family Medicine

## 2015-12-30 DIAGNOSIS — J984 Other disorders of lung: Secondary | ICD-10-CM

## 2015-12-30 DIAGNOSIS — R05 Cough: Secondary | ICD-10-CM | POA: Diagnosis not present

## 2016-01-06 DIAGNOSIS — M5441 Lumbago with sciatica, right side: Secondary | ICD-10-CM | POA: Diagnosis not present

## 2016-01-06 DIAGNOSIS — M545 Low back pain: Secondary | ICD-10-CM | POA: Diagnosis not present

## 2016-01-09 ENCOUNTER — Ambulatory Visit (INDEPENDENT_AMBULATORY_CARE_PROVIDER_SITE_OTHER): Payer: Medicare Other | Admitting: Neurology

## 2016-01-09 ENCOUNTER — Encounter: Payer: Self-pay | Admitting: Neurology

## 2016-01-09 VITALS — BP 125/67 | HR 76 | Resp 20 | Ht 60.0 in | Wt 123.0 lb

## 2016-01-09 DIAGNOSIS — R51 Headache: Secondary | ICD-10-CM

## 2016-01-09 DIAGNOSIS — G8929 Other chronic pain: Secondary | ICD-10-CM

## 2016-01-09 DIAGNOSIS — F329 Major depressive disorder, single episode, unspecified: Secondary | ICD-10-CM

## 2016-01-09 DIAGNOSIS — M5441 Lumbago with sciatica, right side: Secondary | ICD-10-CM | POA: Diagnosis not present

## 2016-01-09 DIAGNOSIS — D329 Benign neoplasm of meninges, unspecified: Secondary | ICD-10-CM | POA: Insufficient documentation

## 2016-01-09 DIAGNOSIS — F32A Depression, unspecified: Secondary | ICD-10-CM

## 2016-01-09 DIAGNOSIS — M545 Low back pain: Secondary | ICD-10-CM | POA: Diagnosis not present

## 2016-01-09 DIAGNOSIS — R519 Headache, unspecified: Secondary | ICD-10-CM

## 2016-01-09 NOTE — Progress Notes (Signed)
Chief Complaint  Patient presents with  . Follow-up    wants to discuss MRI results  . Headache    migraines doing ok, having 2-3 per week, sumaptriptan, zanaflex, and topamax helping, rm 4 with husband     [facility]  PATIENT: [name] DOB: 1951-12-06   REASON FOR VISIT: Follow-up for worsening headaches, new onset depression and abnormal facial movements HISTORY FROM: Patient and husband    HISTORY OF PRESENT ILLNESS: HISTORY: Right-handed African American married female with daily left hemicranial headaches lasting 30 minutes to 1-1/2 hours usually occurring 5 or 6 times per day, previous patient of Dr. Erling Cruz.  Her headaches are always on the left side. She also has a history of complex partial Seizures, last seizure was 2007.  She had an MRI studies of her brain 05/2009, 09/15/09, and 07/02/12 showing stable en plaque meningiomas, one over the left lateral convexity and the other in the middle of the anterior fossa.  There is improvement in the frequency and severity of her headaches with the use of Indomethacin . It cuts down her headaches approximately 75%. Indomethacin is associated with elevated creatinine. She is followed by Dr.Webb. Her creatinine runs 1.19. Because of this she discontinued her indomethacin .  She tried Botox by Dr. Tyron Russell and [place] without benefit . Nasal oxygen increases the speed with which her headache pain relief occurs. She developed headaches in February 2012 and went to the emergency room. She went to the emergency room 10/19/213 with headaches. She continued to have headaches after her ER visit. They are worse at night than during the day. They are not associated with eyelid droop, nasal stuffiness, or tearing. She has gained weight on divalproex sodium.  Update 07/14/2015: [name], 65 year old female returns for follow-up. She continues to have frequent headaches almost on a daily basis and is on polypharmacy to include  Depakote Topamax tizanidine magnesium oxide verapamil and Relpax. She woke up with a headache this morning however she did not take her Relpax. Her husband reports that she snores at night. She complains of significant daytime drowsiness and scores 15 on ESS. She also complains of frequent awakening at night. She is wanting to decrease her Topamax as she has not had a seizure in many years. With the frequency of her headaches I'm not sure her medications are working for her anyway. She returns for reevaluation UPDATE November 08 2015: [name] 65 year old female returns for follow-up. She has a history of intractable headaches always occurring on the left side. She was originally evaluated at this office in 2001 by Dr. Erling Cruz . She has been tried on multiple medication regimens including Botox  in the past see below. She continues to have 5-6 headaches per week. Her headaches might last 1-2 hours then reoccur during the day. She had a sleep study after my last visit with her which was negative for obstructive sleep apnea She went to the emergency room November 7 and received a migraine cocktail with relief. She had a headache the  next day and took hydrocodone. She claims Relpax no longer works for her acutely. She is currently on Topamax, Depakote, verapamil and tizanidine as preventatives. She has also been on Reglan for many years and I noticed some abnormal movements of her face today. She apparently had a headache yesterday and took Tylenol Extra Strength the headache went away but she woke up with a headache this morning. Her headache today is rated between 8  and 9 on the pain scale .She did not take Relpax instead took tizanidine with no relief. On questioning her about her Tylenol she takes it on a daily basis 2 to 4 tabs  she is probably getting some rebound effect. She has chronic renal disease most recent creatinine 1.21 Her review of medications that have been tried in the past include the  following.  Analgesic, Tylenol Percocet Demerol Stadol Tylenol 3, Tylenol No. 4, Ultram Dilaudid and Norco Acute meds Amerge, D.H.E. Imitrex spray Maxalt Migranal Sansert, Zomig and Relpax Antihypertensives atenolol, Calan, currently on verapamil Antinausea Phenergan Reglan Zofran Anti-inflammatory Indocin Celebrex diclofenac Vioxx Muscle relaxants baclofen Robaxin Flexeril currently on Zanaflex Anticonvulsants Lamictal gabapentin Lyrica Keppra and Zonegran, currently on Depakote Topamax and Horizant Steroids prednisone Tranquilizers/ sleepers Thorazine Zyprexa and Vistaril Antidepressant Elavil lithium Paxil Prozac and imipramine  Other currently on magnesium  She returns for reevaluation  UPDATE Jan 09 2016: She is with her husband at today's clinical visit, she reported that her headache overall is under better control, she was given SUMAtriptan Succinate (ONZETRA XSAIL) 11 MG/nasal spray, which has helped her symptoms,   she is having headaches 2-3 times each week, always on the left side, she also noticed mild right leg weakness, mild unsteady gait,  Today we have personally reviewed MRI of the brain with without contrast in December 2016 Slow growth of 2 meningiomas since study of 2010. Left planum sphenoidale meningioma has increased from 5 x 7 mm to 11 x 12 mm.  Left frontoparietal convexity meningioma has increased from 14 x 5 mm to 14 x 8 mm.  She had a history of seizure in the past, last seizure was more than decade ago, but patient reported that her seizure tends to proceed with right arm and leg shaking  REVIEW OF SYSTEMS: Full 14 system review of systems performed and notable only for those listed, all others are neg:  As above  ALLERGIES: Allergies  Allergen Reactions  . Nsaids Other (See Comments)    Can cause ulcers  . Aspirin Other (See Comments) and Nausea Only    Could cause ulcers Could cause ulcers Could cause ulcers    HOME MEDICATIONS: Outpatient  Prescriptions Prior to Visit  Medication Sig Dispense Refill  . atorvastatin (LIPITOR) 40 MG tablet Take 40 mg by mouth daily.    Marland Kitchen BUTRANS 7.5 MCG/HR PTWK Place 1 patch onto the skin once a week. Saturdays  5  . calcium-vitamin D (OSCAL WITH D) 250-125 MG-UNIT per tablet Take 1 tablet by mouth daily.    . divalproex (DEPAKOTE ER) 500 MG 24 hr tablet 1 tablet the morning, 2 tablets in the evening 270 tablet 2  . esomeprazole (NEXIUM) 40 MG capsule Take 40 mg by mouth daily at 12 noon.     . ferrous sulfate 325 (65 FE) MG tablet Take 325 mg by mouth daily with breakfast.    . Linaclotide (LINZESS) 290 MCG CAPS capsule Take 290 mcg by mouth daily.    . magnesium oxide (MAG-OX) 400 MG tablet Take 400 mg by mouth 2 (two) times daily.     . Multiple Vitamin (MULTIVITAMIN WITH MINERALS) TABS Take 1 tablet by mouth daily.    Marland Kitchen olmesartan (BENICAR) 40 MG tablet Take 40 mg by mouth daily.    . ondansetron (ZOFRAN) 4 MG tablet Take 1 tablet (4 mg total) by mouth every 8 (eight) hours as needed for nausea or vomiting. 20 tablet 2  . Riboflavin 100 MG TABS  Take 200 mg by mouth 2 (two) times daily.    . sertraline (ZOLOFT) 25 MG tablet Take 1 tablet (25 mg total) by mouth daily. 30 tablet 3  . SUMAtriptan Succinate (ONZETRA XSAIL) 11 MG/NOSEPC EXHP Place 11 mg into the nose as needed. one per nostril at onset of migraine, may repeat dose after 2 hours do not exceed 2 doses in 24 hours 2 each 3  . tiZANidine (ZANAFLEX) 2 MG tablet TAKE 1 TABLET BY MOUTH  TWICE A DAY 180 tablet 1  . topiramate (TOPAMAX) 100 MG tablet Take 1 tablet (100 mg total) by mouth daily. 90 tablet 2  . traMADol (ULTRAM) 50 MG tablet Take 50 mg by mouth 3 (three) times daily as needed (pain).   4  . traMADol (ULTRAM-ER) 100 MG 24 hr tablet Take 100 mg by mouth every evening.   0  . verapamil (VERELAN PM) 240 MG 24 hr capsule Take 240 mg by mouth at bedtime.     Marland Kitchen HORIZANT 600 MG TBCR Take 600 mg by mouth every evening.  11  .  HYDROcodone-acetaminophen (NORCO) 5-325 MG tablet Take 1 tablet by mouth every 6 (six) hours as needed for moderate pain or severe pain. 10 tablet 0  . SUMAtriptan Succinate (ONZETRA XSAIL) 11 MG/NOSEPC EXHP Place 11 mg into the nose once. May repeat in 2 hours (opposite nare).    LOT [id number]  /Exp 12/2017 2 each 0   No facility-administered medications prior to visit.    PAST MEDICAL HISTORY: Past Medical History  Diagnosis Date  . High cholesterol   . Hypertension   . Type II diabetes mellitus ([place]) dx'd ~ 2014  . GERD (gastroesophageal reflux disease)   . Migraine     "daily" (06/16/2015)  . Epilepsy, grand mal ([place])     "last seizure ~ 2001" (06/16/2015)    PAST SURGICAL HISTORY: Past Surgical History  Procedure Laterality Date  . Laparoscopic incisional / umbilical / ventral hernia repair  06/16/2015    IHR w/mesh  . Hernia repair    . Vaginal hysterectomy  ~ 1992  . Incisional hernia repair N/A 06/16/2015    Procedure: LAPAROSCOPIC INCISIONAL HERNIA WITH MESH;  Surgeon: Coralie Keens, MD;  Location: [place];  Service: General;  Laterality: N/A;  . Insertion of mesh N/A 06/16/2015    Procedure: INSERTION OF MESH;  Surgeon: Coralie Keens, MD;  Location: [place];  Service: General;  Laterality: N/A;    FAMILY HISTORY: Family History  Problem Relation Age of Onset  . Lung cancer Mother   . Hypertension Other   . Kidney failure Father     SOCIAL HISTORY: Social History   Social History  . Marital Status: Married    Spouse Name: Elenore Rota  . Number of Children: 3  . Years of Education: college   Occupational History  .      does not work   Social History Main Topics  . Smoking status: Never Smoker   . Smokeless tobacco: Never Used  . Alcohol Use: No  . Drug Use: No  . Sexual Activity: Not on file   Other Topics Concern  . Not on file   Social History Narrative   Patient lives at home with her husband Elenore Rota). Patient was a homemaker.   Right handed.    College education.   Caffeine- One cup of coffee daily.   Patient has three children.     PHYSICAL EXAM  Filed Vitals:   01/09/16 1107  BP: 125/67  Pulse: 76  Resp: 20  Height: 5' (1.524 m)  Weight: 123 lb (55.792 kg)   Body mass index is 24.02 kg/(m^2).   PHYSICAL EXAMNIATION: Depressed-looking elderly female  Gen: NAD, conversant, well nourised, obese, well groomed                     Cardiovascular: Regular rate rhythm, no peripheral edema, warm, nontender. Eyes: Conjunctivae clear without exudates or hemorrhage Neck: Supple, no carotid bruise. Pulmonary: Clear to auscultation bilaterally   NEUROLOGICAL EXAM:  MENTAL STATUS: Speech:    Speech is normal; fluent and spontaneous with normal comprehension.  Cognition:     Orientation to time, place and person     Normal recent and remote memory     Normal Attention span and concentration     Normal Language, naming, repeating,spontaneous speech     Fund of knowledge   CRANIAL NERVES: CN II: Visual fields are full to confrontation. Fundoscopic exam is normal with sharp discs and no vascular changes. Pupils are round equal and briskly reactive to light. CN III, IV, VI: extraocular movement are normal. No ptosis. CN V: Facial sensation is intact to pinprick in all 3 divisions bilaterally. Corneal responses are intact.  CN VII: Face is symmetric with normal eye closure and smile. CN VIII: Hearing is normal to rubbing fingers CN IX, X: Palate elevates symmetrically. Phonation is normal. CN XI: Head turning and shoulder shrug are intact CN XII: Tongue is midline with normal movements and no atrophy.  MOTOR: There is no pronator drift of out-stretched arms. Muscle bulk and tone are normal. Muscle strength is normal.  REFLEXES: Reflexes are 2+ and symmetric at the biceps, triceps, knees, and ankles. Plantar responses are flexor.  SENSORY: Intact to light touch, pinprick, position sense, and vibration sense are intact in  fingers and toes.  COORDINATION: Rapid alternating movements and fine finger movements are intact. There is no dysmetria on finger-to-nose and heel-knee-shin.    GAIT/STANCE: Posture is normal. Gait is steady with normal steps, base, arm swing, and turning. Heel and toe walking are normal. Tandem gait is normal.  Romberg is absent.    DIAGNOSTIC DATA (LABS, IMAGING, TESTING) - I reviewed patient records, labs, notes, testing and imaging myself where available.  Lab Results  Component Value Date   WBC 8.0 06/08/2015   HGB 12.2 06/08/2015   HCT 38.0 06/08/2015   MCV 89.6 06/08/2015   PLT 262 06/08/2015      Component Value Date/Time   NA 140 06/08/2015 1328   K 4.4 06/08/2015 1328   CL 104 06/08/2015 1328   CO2 26 06/08/2015 1328   GLUCOSE 78 06/08/2015 1328   BUN 24* 06/08/2015 1328   CREATININE 1.21* 06/08/2015 1328   CALCIUM 9.6 06/08/2015 1328   PROT 7.6 10/25/2008 1155   ALBUMIN 4.6 10/25/2008 1155   AST 16 10/25/2008 1155   ALT 12 10/25/2008 1155   ALKPHOS 52 10/25/2008 1155   BILITOT 0.4 10/25/2008 1155   GFRNONAA 76* 06/08/2015 1328   GFRAA 53* 06/08/2015 1328    Lab Results  Component Value Date   HGBA1C 6.3* 06/08/2015    ASSESSMENT AND PLAN  65 y.o. year old female  Chronic migraine headaches Left frontal and parietal meningioma,  Potential symptomatic left planum sphenoid meningioma, which has doubled in size in past few years,  I will refer her to a neurosurgeon for second opinion,    Continue current preventive medications for migraine Depakote,  Topamax  Marcial Pacas, M.D. Ph.D.  [facility] [place], [place] [address] Phone: [phone] Fax:      [phone]

## 2016-01-12 DIAGNOSIS — D329 Benign neoplasm of meninges, unspecified: Secondary | ICD-10-CM | POA: Diagnosis not present

## 2016-01-12 DIAGNOSIS — M5441 Lumbago with sciatica, right side: Secondary | ICD-10-CM | POA: Diagnosis not present

## 2016-01-12 DIAGNOSIS — M545 Low back pain: Secondary | ICD-10-CM | POA: Diagnosis not present

## 2016-01-16 DIAGNOSIS — M5441 Lumbago with sciatica, right side: Secondary | ICD-10-CM | POA: Diagnosis not present

## 2016-01-16 DIAGNOSIS — M545 Low back pain: Secondary | ICD-10-CM | POA: Diagnosis not present

## 2016-01-17 ENCOUNTER — Ambulatory Visit: Payer: Medicare Other | Admitting: Nurse Practitioner

## 2016-01-18 ENCOUNTER — Encounter: Payer: Self-pay | Admitting: Nurse Practitioner

## 2016-01-18 DIAGNOSIS — J189 Pneumonia, unspecified organism: Secondary | ICD-10-CM | POA: Diagnosis not present

## 2016-01-19 ENCOUNTER — Ambulatory Visit
Admission: RE | Admit: 2016-01-19 | Discharge: 2016-01-19 | Disposition: A | Discharge status: Home / Self Care | Payer: Medicare Other | Source: Ambulatory Visit | Attending: Gastroenterology | Admitting: Gastroenterology

## 2016-01-19 ENCOUNTER — Other Ambulatory Visit: Payer: Self-pay | Admitting: Gastroenterology

## 2016-01-19 DIAGNOSIS — M5441 Lumbago with sciatica, right side: Secondary | ICD-10-CM | POA: Diagnosis not present

## 2016-01-19 DIAGNOSIS — K5900 Constipation, unspecified: Secondary | ICD-10-CM

## 2016-01-19 DIAGNOSIS — R1084 Generalized abdominal pain: Secondary | ICD-10-CM

## 2016-01-19 DIAGNOSIS — N182 Chronic kidney disease, stage 2 (mild): Secondary | ICD-10-CM | POA: Diagnosis not present

## 2016-01-19 DIAGNOSIS — R109 Unspecified abdominal pain: Secondary | ICD-10-CM | POA: Diagnosis not present

## 2016-01-19 DIAGNOSIS — M545 Low back pain: Secondary | ICD-10-CM | POA: Diagnosis not present

## 2016-01-21 ENCOUNTER — Emergency Department (HOSPITAL_COMMUNITY): Payer: Medicare Other

## 2016-01-21 ENCOUNTER — Emergency Department (HOSPITAL_COMMUNITY)
Admission: EM | Admit: 2016-01-21 | Discharge: 2016-01-22 | Disposition: A | Discharge status: Home / Self Care | Payer: Medicare Other | Attending: Emergency Medicine | Admitting: Emergency Medicine

## 2016-01-21 ENCOUNTER — Encounter (HOSPITAL_COMMUNITY): Payer: Self-pay | Admitting: *Deleted

## 2016-01-21 DIAGNOSIS — K219 Gastro-esophageal reflux disease without esophagitis: Secondary | ICD-10-CM | POA: Diagnosis not present

## 2016-01-21 DIAGNOSIS — G43909 Migraine, unspecified, not intractable, without status migrainosus: Secondary | ICD-10-CM | POA: Insufficient documentation

## 2016-01-21 DIAGNOSIS — Z8701 Personal history of pneumonia (recurrent): Secondary | ICD-10-CM | POA: Insufficient documentation

## 2016-01-21 DIAGNOSIS — R197 Diarrhea, unspecified: Secondary | ICD-10-CM | POA: Diagnosis not present

## 2016-01-21 DIAGNOSIS — E7800 Pure hypercholesterolemia, unspecified: Secondary | ICD-10-CM | POA: Insufficient documentation

## 2016-01-21 DIAGNOSIS — R1030 Lower abdominal pain, unspecified: Secondary | ICD-10-CM | POA: Diagnosis present

## 2016-01-21 DIAGNOSIS — N814 Uterovaginal prolapse, unspecified: Secondary | ICD-10-CM

## 2016-01-21 DIAGNOSIS — E119 Type 2 diabetes mellitus without complications: Secondary | ICD-10-CM | POA: Insufficient documentation

## 2016-01-21 DIAGNOSIS — K4090 Unilateral inguinal hernia, without obstruction or gangrene, not specified as recurrent: Secondary | ICD-10-CM | POA: Insufficient documentation

## 2016-01-21 DIAGNOSIS — G40409 Other generalized epilepsy and epileptic syndromes, not intractable, without status epilepticus: Secondary | ICD-10-CM | POA: Diagnosis not present

## 2016-01-21 DIAGNOSIS — K469 Unspecified abdominal hernia without obstruction or gangrene: Secondary | ICD-10-CM | POA: Diagnosis not present

## 2016-01-21 DIAGNOSIS — I10 Essential (primary) hypertension: Secondary | ICD-10-CM | POA: Insufficient documentation

## 2016-01-21 DIAGNOSIS — Z79899 Other long term (current) drug therapy: Secondary | ICD-10-CM | POA: Insufficient documentation

## 2016-01-21 DIAGNOSIS — R109 Unspecified abdominal pain: Secondary | ICD-10-CM | POA: Diagnosis not present

## 2016-01-21 LAB — URINALYSIS, ROUTINE W REFLEX MICROSCOPIC
Bilirubin Urine: NEGATIVE
Glucose, UA: NEGATIVE mg/dL
Hgb urine dipstick: NEGATIVE
Ketones, ur: NEGATIVE mg/dL
Leukocytes, UA: NEGATIVE
Nitrite: NEGATIVE
Protein, ur: NEGATIVE mg/dL
Specific Gravity, Urine: 1.01 (ref 1.005–1.030)
pH: 7 (ref 5.0–8.0)

## 2016-01-21 LAB — COMPREHENSIVE METABOLIC PANEL
ALT: 15 U/L (ref 14–54)
AST: 25 U/L (ref 15–41)
Albumin: 3.1 g/dL — ABNORMAL LOW (ref 3.5–5.0)
Alkaline Phosphatase: 54 U/L (ref 38–126)
Anion gap: 9 (ref 5–15)
BUN: 9 mg/dL (ref 6–20)
CO2: 27 mmol/L (ref 22–32)
Calcium: 9.4 mg/dL (ref 8.9–10.3)
Chloride: 104 mmol/L (ref 101–111)
Creatinine, Ser: 1.29 mg/dL — ABNORMAL HIGH (ref 0.44–1.00)
GFR calc Af Amer: 49 mL/min — ABNORMAL LOW (ref >60)
GFR calc non Af Amer: 42 mL/min — ABNORMAL LOW (ref >60)
Glucose, Bld: 103 mg/dL — ABNORMAL HIGH (ref 65–99)
Potassium: 4.5 mmol/L (ref 3.5–5.1)
Sodium: 140 mmol/L (ref 135–145)
Total Bilirubin: 0.3 mg/dL (ref 0.3–1.2)
Total Protein: 6 g/dL — ABNORMAL LOW (ref 6.5–8.1)

## 2016-01-21 LAB — CBC
HCT: 36.9 % (ref 36.0–46.0)
Hemoglobin: 11.8 g/dL — ABNORMAL LOW (ref 12.0–15.0)
MCH: 29.8 pg (ref 26.0–34.0)
MCHC: 32 g/dL (ref 30.0–36.0)
MCV: 93.2 fL (ref 78.0–100.0)
Platelets: 192 10*3/uL (ref 150–400)
RBC: 3.96 MIL/uL (ref 3.87–5.11)
RDW: 14.9 % (ref 11.5–15.5)
WBC: 6.5 10*3/uL (ref 4.0–10.5)

## 2016-01-21 LAB — LIPASE, BLOOD: Lipase: 35 U/L (ref 11–51)

## 2016-01-21 MED ORDER — IOHEXOL 300 MG/ML  SOLN
100.0000 mL | Freq: Once | INTRAMUSCULAR | Status: AC | PRN
  Administered 2016-01-21: 75 mL via INTRAVENOUS

## 2016-01-21 NOTE — ED Provider Notes (Signed)
CSN: [id number]     Arrival date & time 01/21/16  2055 History   First MD Initiated Contact with Patient 01/21/16 2153     Chief Complaint  Patient presents with  . Abdominal Pain     (Consider location/radiation/quality/duration/timing/severity/associated sxs/prior Treatment) HPI Comments: 65 year old female with past medical history including hypertension, hyperlipidemia, type 2 diabetes mellitus, migraines, epilepsy who presents with abdominal pain and diarrhea. The patient states that 10 days ago she was diagnosed with pneumonia and finished an antibiotic course approximately 3 days ago. She also notes that she was told her PCP to start MiraLAX for constipation. Towards the end of the antibiotic course, she began having diarrhea and reports that she has continued to have nonbloody diarrhea, 5-6 episodes daily. She is still taking MiraLAX because she thought that she was supposed to continue it if she had abdominal pain. She reports an episode of abdominal pain yesterday that lasted several hours and resolved. The pain returned again today and has been persistent. The pain is crampy and in her suprapubic abdomen. He denies any vomiting, fevers, cough/cold symptoms, or urinary symptoms.  Patient is a 65 y.o. female presenting with abdominal pain. The history is provided by the patient.  Abdominal Pain   Past Medical History  Diagnosis Date  . High cholesterol   . Hypertension   . Type II diabetes mellitus ([place]) dx'd ~ 2014  . GERD (gastroesophageal reflux disease)   . Migraine     "daily" (06/16/2015)  . Epilepsy, grand mal ([place])     "last seizure ~ 2001" (06/16/2015)   Past Surgical History  Procedure Laterality Date  . Laparoscopic incisional / umbilical / ventral hernia repair  06/16/2015    IHR w/mesh  . Hernia repair    . Vaginal hysterectomy  ~ 1992  . Incisional hernia repair N/A 06/16/2015    Procedure: LAPAROSCOPIC INCISIONAL HERNIA WITH MESH;  Surgeon: Coralie Keens, MD;   Location: [place];  Service: General;  Laterality: N/A;  . Insertion of mesh N/A 06/16/2015    Procedure: INSERTION OF MESH;  Surgeon: Coralie Keens, MD;  [facility];  Service: General;  Laterality: N/A;   Family History  Problem Relation Age of Onset  . Lung cancer Mother   . Hypertension Other   . Kidney failure Father    Social History  Substance Use Topics  . Smoking status: Never Smoker   . Smokeless tobacco: Never Used  . Alcohol Use: No   OB History    No data available     Review of Systems  Gastrointestinal: Positive for abdominal pain.    10 Systems reviewed and are negative for acute change except as noted in the HPI.   Allergies  Nsaids and Aspirin  Home Medications   Prior to Admission medications   Medication Sig Start Date End Date Taking? Authorizing Provider  atorvastatin (LIPITOR) 40 MG tablet Take 40 mg by mouth daily.   Yes Historical Provider, MD  BUTRANS 7.5 MCG/HR PTWK Place 1 patch onto the skin once a week. Saturdays 10/25/15  Yes Historical Provider, MD  calcium-vitamin D (OSCAL WITH D) 250-125 MG-UNIT per tablet Take 1 tablet by mouth daily.   Yes Historical Provider, MD  divalproex (DEPAKOTE ER) 500 MG 24 hr tablet 1 tablet the morning, 2 tablets in the evening 12/28/15  Yes Marcial Pacas, MD  esomeprazole (NEXIUM) 40 MG capsule Take 40 mg by mouth daily at 12 noon.  07/13/15  Yes Historical Provider, MD  ferrous  sulfate 325 (65 FE) MG tablet Take 325 mg by mouth daily with breakfast.   Yes Historical Provider, MD  magnesium oxide (MAG-OX) 400 MG tablet Take 400 mg by mouth 2 (two) times daily.    Yes Historical Provider, MD  Multiple Vitamin (MULTIVITAMIN WITH MINERALS) TABS Take 1 tablet by mouth daily.   Yes Historical Provider, MD  olmesartan (BENICAR) 40 MG tablet Take 40 mg by mouth daily.   Yes Historical Provider, MD  OVER THE COUNTER MEDICATION Take 5 mLs by mouth 4 (four) times daily. Miracle Mouthwash-OTC   Yes Historical Provider, MD   SUMAtriptan Succinate (ONZETRA XSAIL) 11 MG/NOSEPC EXHP Place 11 mg into the nose as needed. one per nostril at onset of migraine, may repeat dose after 2 hours do not exceed 2 doses in 24 hours 11/09/15  Yes Dennie Bible, NP  tiZANidine (ZANAFLEX) 2 MG tablet TAKE 1 TABLET BY MOUTH  TWICE A DAY 10/05/15  Yes Marcial Pacas, MD  topiramate (TOPAMAX) 100 MG tablet Take 1 tablet (100 mg total) by mouth daily. 07/14/15  Yes Dennie Bible, NP  traMADol (ULTRAM) 50 MG tablet Take 50 mg by mouth 3 (three) times daily as needed (pain).  05/02/15  Yes Historical Provider, MD  traMADol (ULTRAM-ER) 100 MG 24 hr tablet Take 100 mg by mouth every evening.  05/18/15  Yes Historical Provider, MD  verapamil (VERELAN PM) 240 MG 24 hr capsule Take 240 mg by mouth at bedtime.  09/07/15  Yes Historical Provider, MD  ondansetron (ZOFRAN) 4 MG tablet Take 1 tablet (4 mg total) by mouth every 8 (eight) hours as needed for nausea or vomiting. 11/08/15   Dennie Bible, NP  sertraline (ZOLOFT) 25 MG tablet Take 1 tablet (25 mg total) by mouth daily. Patient not taking: Reported on 01/21/2016 11/08/15   Dennie Bible, NP   BP 159/75 mmHg  Pulse 74  Temp(Src) 97.9 F (36.6 C)  Resp 17  Ht 5' (1.524 m)  Wt 125 lb 9 oz (56.955 kg)  BMI 24.52 kg/m2  SpO2 100% Physical Exam  Constitutional: She is oriented to person, place, and time. She appears well-developed and well-nourished. No distress.  HENT:  Head: Normocephalic and atraumatic.  Moist mucous membranes  Eyes: Conjunctivae are normal. Pupils are equal, round, and reactive to light.  Neck: Neck supple.  Cardiovascular: Normal rate, regular rhythm and normal heart sounds.   No murmur heard. Pulmonary/Chest: Effort normal and breath sounds normal.  Abdominal: Soft. Bowel sounds are normal. She exhibits no distension. There is no rebound and no guarding.  TTP suprapubic abdomen  Musculoskeletal: She exhibits no edema.  Neurological: She is  alert and oriented to person, place, and time.  Fluent speech  Skin: Skin is warm and dry.  Psychiatric:  Flat affect  Nursing note and vitals reviewed.   ED Course  Procedures (including critical care time) Labs Review Labs Reviewed  COMPREHENSIVE METABOLIC PANEL - Abnormal; Notable for the following:    Glucose, Bld 103 (*)    Creatinine, Ser 1.29 (*)    Total Protein 6.0 (*)    Albumin 3.1 (*)    GFR calc non Af Amer 42 (*)    GFR calc Af Amer 49 (*)    All other components within normal limits  CBC - Abnormal; Notable for the following:    Hemoglobin 11.8 (*)    All other components within normal limits  LIPASE, BLOOD  URINALYSIS, ROUTINE W REFLEX MICROSCOPIC (NOT AT  [place])    Imaging Review Ct Abdomen Pelvis W Contrast  01/22/2016  CLINICAL DATA:  Pelvic and abdominal tenderness. Lower abdominal pain. Diarrhea. Symptoms since this morning. EXAM: CT ABDOMEN AND PELVIS WITH CONTRAST TECHNIQUE: Multidetector CT imaging of the abdomen and pelvis was performed using the standard protocol following bolus administration of intravenous contrast. CONTRAST:  21mL OMNIPAQUE IOHEXOL 300 MG/ML  SOLN COMPARISON:  12/27/2015 FINDINGS: Focal infiltration seen previously in the right lung base has significantly decreased since previous study suggesting resolving pneumonia. Mild diffuse fatty infiltration of the liver. 13 mm cyst in the right lower lobe of liver. Cholelithiasis. No bile duct dilatation. The pancreas, spleen, adrenal glands, kidneys, abdominal aorta, inferior vena cava, and retroperitoneal lymph nodes are unremarkable. Stomach, small bowel, and colon are not abnormally distended. Stool is diffusely filled throughout the colon. No free air or free fluid in the abdomen. Pelvis: Anterior pelvic wall hernia to the left of midline inferiorly. This contains a portion of the sigmoid colon and a small amount of fluid. The hernia was demonstrated previously and on the prior study contains small  bowel. There is no evidence of proximal obstruction. The appendix is normal. No free or loculated pelvic fluid collections. No pelvic mass or lymphadenopathy. Bladder wall is not thickened. No destructive bone lesions. IMPRESSION: Large anterior pelvic wall hernia to the left of midline contains sigmoid colon and a small amount of fluid. No evidence of proximal obstruction. Diffusely stool-filled colon. Diffuse fatty infiltration of the liver. Cholelithiasis. Decreasing consolidation in the right lung base since previous study suggesting improving pneumonia. Electronically Signed   By: Lucienne Capers M.D.   On: 01/22/2016 00:36   I have personally reviewed and evaluated these lab results as part of my medical decision-making.   EKG Interpretation None      MDM   Final diagnoses:  Diarrhea, unspecified type  Pelvic floor hernia   Patient presented with 2 days of lower abdominal pain as well as several days of diarrhea in the setting of recent antibiotics and current MiraLAX use. On exam, she was nontoxic and in no acute distress. Vital signs unremarkable. She had suprapubic tenderness to palpation without rebound or guarding. I considered C. difficile testing, however after further discussion, I discover the patient is currently still taking MiraLAX as she thought that her PCP had told her to continue it for any abdominal pain. Given that her diarrhea is in the setting of laxative use, I feel that C. difficile is likely. Obtained above lab work which shows creatinine 1.29 which is consistent with her baseline. The remainder of her lab work was unremarkable. Because of her tenderness on exam, obtained a CT of the abdomen and pelvis to evaluate for diverticulitis or other acute intra-abdominal process.  CT shows large anterior pelvic wall hernia containing some bowel but no evidence of obstruction. Patient is ambulatory on reexamination and without any evidence of incarceration or strangulation of  bowel. I've instructed to discontinue MiraLAX and monitor for improvement of diarrhea. I've instructed her to follow-up with PCP regarding diarrhea and abdominal pain. Return precautions including any signs of bowel incarceration/angulation reviewed. Patient voiced understanding and was discharged in satisfactory condition.  Sharlett Iles, MD 01/22/16 [phone]

## 2016-01-21 NOTE — ED Notes (Signed)
The pt is c/o lower abd pain and diarrhea since this am

## 2016-01-22 DIAGNOSIS — R109 Unspecified abdominal pain: Secondary | ICD-10-CM | POA: Diagnosis not present

## 2016-01-22 DIAGNOSIS — K4090 Unilateral inguinal hernia, without obstruction or gangrene, not specified as recurrent: Secondary | ICD-10-CM | POA: Diagnosis not present

## 2016-01-22 MED ORDER — SODIUM CHLORIDE 0.9 % IV BOLUS (SEPSIS)
1000.0000 mL | Freq: Once | INTRAVENOUS | Status: AC
  Administered 2016-01-22: 1000 mL via INTRAVENOUS

## 2016-01-22 NOTE — Discharge Instructions (Signed)
PLEASE DISCONTINUE USING MIRALAX TO SEE IF THE DIARRHEA IMPROVES. PLEASE SCHEDULE A FOLLOW UP APPOINTMENT WITH YOUR PRIMARY CARE PROVIDER.

## 2016-01-23 DIAGNOSIS — M5441 Lumbago with sciatica, right side: Secondary | ICD-10-CM | POA: Diagnosis not present

## 2016-01-23 DIAGNOSIS — M545 Low back pain: Secondary | ICD-10-CM | POA: Diagnosis not present

## 2016-02-02 ENCOUNTER — Telehealth: Payer: Self-pay | Admitting: Neurology

## 2016-02-02 DIAGNOSIS — M5441 Lumbago with sciatica, right side: Secondary | ICD-10-CM | POA: Diagnosis not present

## 2016-02-02 DIAGNOSIS — M545 Low back pain: Secondary | ICD-10-CM | POA: Diagnosis not present

## 2016-02-02 MED ORDER — TOPIRAMATE 100 MG PO TABS: 100.0000 mg | ORAL_TABLET | Freq: Every day | ORAL | Status: DC

## 2016-02-02 NOTE — Telephone Encounter (Signed)
Patient is calling to get a new Rx for topiramate (TOPAMAX) 100 MG tablet #90 with 3 refills called to [facility] on [address]. The patient has changed pharmacies.

## 2016-02-06 DIAGNOSIS — E119 Type 2 diabetes mellitus without complications: Secondary | ICD-10-CM | POA: Diagnosis not present

## 2016-02-06 DIAGNOSIS — R600 Localized edema: Secondary | ICD-10-CM | POA: Diagnosis not present

## 2016-02-06 DIAGNOSIS — M7061 Trochanteric bursitis, right hip: Secondary | ICD-10-CM | POA: Diagnosis not present

## 2016-02-06 DIAGNOSIS — M545 Low back pain: Secondary | ICD-10-CM | POA: Diagnosis not present

## 2016-02-06 DIAGNOSIS — R350 Frequency of micturition: Secondary | ICD-10-CM | POA: Diagnosis not present

## 2016-02-06 DIAGNOSIS — M5441 Lumbago with sciatica, right side: Secondary | ICD-10-CM | POA: Diagnosis not present

## 2016-02-06 DIAGNOSIS — I10 Essential (primary) hypertension: Secondary | ICD-10-CM | POA: Diagnosis not present

## 2016-02-09 ENCOUNTER — Ambulatory Visit (INDEPENDENT_AMBULATORY_CARE_PROVIDER_SITE_OTHER): Payer: Medicare Other | Admitting: Podiatry

## 2016-02-09 ENCOUNTER — Ambulatory Visit (INDEPENDENT_AMBULATORY_CARE_PROVIDER_SITE_OTHER): Payer: Medicare Other

## 2016-02-09 ENCOUNTER — Encounter: Payer: Self-pay | Admitting: Podiatry

## 2016-02-09 VITALS — BP 124/72 | HR 68 | Resp 16 | Ht 60.0 in | Wt 122.0 lb

## 2016-02-09 DIAGNOSIS — M79671 Pain in right foot: Secondary | ICD-10-CM | POA: Diagnosis not present

## 2016-02-09 DIAGNOSIS — L600 Ingrowing nail: Secondary | ICD-10-CM | POA: Diagnosis not present

## 2016-02-09 DIAGNOSIS — M2042 Other hammer toe(s) (acquired), left foot: Secondary | ICD-10-CM | POA: Diagnosis not present

## 2016-02-09 DIAGNOSIS — M79672 Pain in left foot: Secondary | ICD-10-CM | POA: Diagnosis not present

## 2016-02-09 NOTE — Progress Notes (Signed)
   Subjective:    Patient ID: [name], female    DOB: 16-Aug-1951, 65 y.o.   MRN: [id number]  HPI Patient presents with bilateral foot pain; Bilateral; swelling; dorsal. Pt stated, "Right foot hurts more than left foot".  Patient also presents with a nail problem on their Left foot; 5th toe; nail is split.  Pt diabetic type 2; sugar=did not take today; A1C=5 4. Pt stated, "she does not need to take medication or check her sugar".  Review of Systems  Neurological: Positive for seizures and headaches.  All other systems reviewed and are negative.      Objective:   Physical Exam        Assessment & Plan:

## 2016-02-09 NOTE — Patient Instructions (Signed)

## 2016-02-09 NOTE — Progress Notes (Signed)
Subjective:     Patient ID: [name], female   DOB: [birth year], 65 y.o.   MRN: [id number]  HPI patient states I've had a painful nail on my right big toe and my left fifth nail on the outside can bother me in the toe is rotated. States it's been going on for a while   Review of Systems  All other systems reviewed and are negative.      Objective:   Physical Exam  Constitutional: She is oriented to person, place, and time.  Cardiovascular: Intact distal pulses.   Musculoskeletal: Normal range of motion.  Neurological: She is oriented to person, place, and time.  Skin: Skin is warm.  Nursing note and vitals reviewed.  neurovascular status intact muscle strength adequate range of motion within normal limits with patient found to have painful incurvated nailbed right hallux medial border that's hard for her to cut and has irritated tissue with damage no nail and also noted on the left fifth toe to have a rotated fifth toe with distal lateral irritated tissue formation. Patient's found to have good digital perfusion is well oriented 3     Assessment:     Ingrown toenail deformity right hallux with left fifth toe having hammertoe deformity with distal lateral nail    Plan:     H&P and both conditions reviewed. I've recommended removal of the nail corner and I explained the procedure and risk and today I infiltrated the right hallux 60 mg like Marcaine mixture removed the medial border exposed matrix and applied phenol 3 applications 30 seconds followed by alcohol lavage and sterile dressing area gave instructions on soaks and reappoint and debris did the fifth nail bed left

## 2016-02-10 ENCOUNTER — Telehealth: Payer: Self-pay | Admitting: *Deleted

## 2016-02-10 MED ORDER — ACETAMINOPHEN-CODEINE #3 300-30 MG PO TABS: 1.0000 | ORAL_TABLET | ORAL | Status: DC | PRN

## 2016-02-10 NOTE — Telephone Encounter (Signed)
Pt's husband, Elenore Rota states pt does not know how long to soak the toes after the toenail procedure, and she needs something for pain.  I told Elenore Rota to soak 20 minutes twice daily, and I would ask the doctor in office for pain medication.  Dr Jacqualyn Posey ordered Tylenol #3 #30 one tablet every 4 hours prn pain. Orders called to pt, and instructed her to pick the rx up in the [place] office.

## 2016-02-27 DIAGNOSIS — L600 Ingrowing nail: Secondary | ICD-10-CM | POA: Diagnosis not present

## 2016-02-27 DIAGNOSIS — I10 Essential (primary) hypertension: Secondary | ICD-10-CM | POA: Diagnosis not present

## 2016-02-27 DIAGNOSIS — R609 Edema, unspecified: Secondary | ICD-10-CM | POA: Diagnosis not present

## 2016-02-28 ENCOUNTER — Ambulatory Visit (INDEPENDENT_AMBULATORY_CARE_PROVIDER_SITE_OTHER): Payer: Medicare Other | Admitting: Podiatry

## 2016-02-28 ENCOUNTER — Encounter: Payer: Self-pay | Admitting: Podiatry

## 2016-02-28 VITALS — BP 140/79 | HR 79 | Resp 12

## 2016-02-28 DIAGNOSIS — L03031 Cellulitis of right toe: Secondary | ICD-10-CM | POA: Diagnosis not present

## 2016-02-28 DIAGNOSIS — L03011 Cellulitis of right finger: Secondary | ICD-10-CM

## 2016-02-28 MED ORDER — CEPHALEXIN 500 MG PO CAPS: 500.0000 mg | ORAL_CAPSULE | Freq: Two times a day (BID) | ORAL | Status: DC

## 2016-02-28 NOTE — Progress Notes (Signed)
Subjective:     Patient ID: [name], female   DOB: [birth year], 65 y.o.   MRN: [id number]  HPI patient presents with caregiver concerned because the right big toe has crusted tissue on the medial side after ingrown toenail removal several weeks ago   Review of Systems     Objective:   Physical Exam Neurovascular status unchanged with crusted tissue on the medial side of the right big toe where ingrown toenail procedure was done with no proximal edema erythema or drainage noted    Assessment:     Localized paronychia infection with crusted tissue which is concerning patient and caregiver    Plan:     I explained that this is a relatively normal appearance for several weeks after procedure and I do not see any indications of proximal changes. As precautionary measure I placed her on cephalexin 500 mg twice a day I gave her instructions on air dry and at night and cushioning during the day and if any redness drainage or pain were to occur she is to reappoint immediately

## 2016-02-29 DIAGNOSIS — G44049 Chronic paroxysmal hemicrania, not intractable: Secondary | ICD-10-CM | POA: Diagnosis not present

## 2016-03-01 ENCOUNTER — Other Ambulatory Visit: Payer: Self-pay

## 2016-03-01 DIAGNOSIS — Z1231 Encounter for screening mammogram for malignant neoplasm of breast: Secondary | ICD-10-CM

## 2016-03-05 ENCOUNTER — Other Ambulatory Visit: Payer: Self-pay | Admitting: *Deleted

## 2016-03-05 ENCOUNTER — Telehealth: Payer: Self-pay | Admitting: Neurology

## 2016-03-05 MED ORDER — DIVALPROEX SODIUM ER 500 MG PO TB24: ORAL_TABLET | ORAL | Status: DC

## 2016-03-05 NOTE — Telephone Encounter (Signed)
Rx sent into the requested local pharmacy - family aware to pick up.

## 2016-03-05 NOTE — Telephone Encounter (Signed)
Pt's husband called requesting 1 week refill for divalproex (DEPAKOTE ER) 500 MG [facility] hr [facility] [address] She has 2 tabs left.  He said the refill is coming from [facility] won't make it in time.

## 2016-03-07 DIAGNOSIS — M62830 Muscle spasm of back: Secondary | ICD-10-CM | POA: Diagnosis not present

## 2016-03-19 DIAGNOSIS — E7800 Pure hypercholesterolemia, unspecified: Secondary | ICD-10-CM | POA: Diagnosis not present

## 2016-03-19 DIAGNOSIS — E119 Type 2 diabetes mellitus without complications: Secondary | ICD-10-CM | POA: Diagnosis not present

## 2016-03-19 DIAGNOSIS — M25511 Pain in right shoulder: Secondary | ICD-10-CM | POA: Diagnosis not present

## 2016-03-19 DIAGNOSIS — R350 Frequency of micturition: Secondary | ICD-10-CM | POA: Diagnosis not present

## 2016-03-19 DIAGNOSIS — I10 Essential (primary) hypertension: Secondary | ICD-10-CM | POA: Diagnosis not present

## 2016-03-21 DIAGNOSIS — N9089 Other specified noninflammatory disorders of vulva and perineum: Secondary | ICD-10-CM | POA: Diagnosis not present

## 2016-03-21 DIAGNOSIS — N3945 Continuous leakage: Secondary | ICD-10-CM | POA: Diagnosis not present

## 2016-03-21 DIAGNOSIS — R102 Pelvic and perineal pain: Secondary | ICD-10-CM | POA: Diagnosis not present

## 2016-03-24 DIAGNOSIS — M542 Cervicalgia: Secondary | ICD-10-CM | POA: Diagnosis not present

## 2016-03-24 DIAGNOSIS — M7541 Impingement syndrome of right shoulder: Secondary | ICD-10-CM | POA: Diagnosis not present

## 2016-03-24 DIAGNOSIS — M5030 Other cervical disc degeneration, unspecified cervical region: Secondary | ICD-10-CM | POA: Diagnosis not present

## 2016-03-24 DIAGNOSIS — M25511 Pain in right shoulder: Secondary | ICD-10-CM | POA: Diagnosis not present

## 2016-03-28 DIAGNOSIS — D329 Benign neoplasm of meninges, unspecified: Secondary | ICD-10-CM | POA: Diagnosis not present

## 2016-03-28 DIAGNOSIS — I129 Hypertensive chronic kidney disease with stage 1 through stage 4 chronic kidney disease, or unspecified chronic kidney disease: Secondary | ICD-10-CM | POA: Diagnosis not present

## 2016-03-28 DIAGNOSIS — R413 Other amnesia: Secondary | ICD-10-CM | POA: Diagnosis not present

## 2016-03-28 DIAGNOSIS — R2689 Other abnormalities of gait and mobility: Secondary | ICD-10-CM | POA: Diagnosis not present

## 2016-03-28 DIAGNOSIS — N189 Chronic kidney disease, unspecified: Secondary | ICD-10-CM | POA: Diagnosis not present

## 2016-03-28 DIAGNOSIS — E1122 Type 2 diabetes mellitus with diabetic chronic kidney disease: Secondary | ICD-10-CM | POA: Diagnosis not present

## 2016-04-09 ENCOUNTER — Ambulatory Visit: Payer: Medicare Other | Admitting: Nurse Practitioner

## 2016-04-09 DIAGNOSIS — M25511 Pain in right shoulder: Secondary | ICD-10-CM | POA: Diagnosis not present

## 2016-04-10 ENCOUNTER — Encounter: Payer: Self-pay | Admitting: Nurse Practitioner

## 2016-04-11 DIAGNOSIS — E7800 Pure hypercholesterolemia, unspecified: Secondary | ICD-10-CM | POA: Diagnosis not present

## 2016-04-11 DIAGNOSIS — E119 Type 2 diabetes mellitus without complications: Secondary | ICD-10-CM | POA: Diagnosis not present

## 2016-04-12 DIAGNOSIS — G939 Disorder of brain, unspecified: Secondary | ICD-10-CM | POA: Diagnosis not present

## 2016-04-12 DIAGNOSIS — D329 Benign neoplasm of meninges, unspecified: Secondary | ICD-10-CM | POA: Diagnosis not present

## 2016-04-17 DIAGNOSIS — M25511 Pain in right shoulder: Secondary | ICD-10-CM | POA: Diagnosis not present

## 2016-04-19 ENCOUNTER — Ambulatory Visit (INDEPENDENT_AMBULATORY_CARE_PROVIDER_SITE_OTHER): Payer: Medicare Other | Admitting: Nurse Practitioner

## 2016-04-19 ENCOUNTER — Encounter: Payer: Self-pay | Admitting: Nurse Practitioner

## 2016-04-19 VITALS — BP 98/78 | HR 83 | Wt 121.4 lb

## 2016-04-19 DIAGNOSIS — D329 Benign neoplasm of meninges, unspecified: Secondary | ICD-10-CM

## 2016-04-19 DIAGNOSIS — R51 Headache: Secondary | ICD-10-CM

## 2016-04-19 DIAGNOSIS — R519 Headache, unspecified: Secondary | ICD-10-CM

## 2016-04-19 DIAGNOSIS — G8929 Other chronic pain: Secondary | ICD-10-CM

## 2016-04-19 DIAGNOSIS — F329 Major depressive disorder, single episode, unspecified: Secondary | ICD-10-CM

## 2016-04-19 DIAGNOSIS — F32A Depression, unspecified: Secondary | ICD-10-CM

## 2016-04-19 MED ORDER — TIZANIDINE HCL 2 MG PO TABS: ORAL_TABLET | ORAL | Status: DC

## 2016-04-19 NOTE — Progress Notes (Signed)
[facility]  PATIENT: [name] DOB: 08-31-1951   REASON FOR VISIT: Follow-up for intractable headache, history of depression meningioma HISTORY FROM: Patient    HISTORY OF PRESENT ILLNESS: HISTORY: Right-handed African American married female with daily left hemicranial headaches lasting 30 minutes to 1-1/2 hours usually occurring 5 or 6 times per day, previous patient of Dr. Erling Cruz.  Her headaches are always on the left side. She also has a history of complex partial Seizures, last seizure was 2007.  She had an MRI studies of her brain 05/2009, 09/15/09, and 07/02/12 showing stable en plaque meningiomas, one over the left lateral convexity and the other in the middle of the anterior fossa.  There is improvement in the frequency and severity of her headaches with the use of Indomethacin . It cuts down her headaches approximately 75%. Indomethacin is associated with elevated creatinine. She is followed by Dr.Webb. Her creatinine runs 1.19. Because of this she discontinued her indomethacin .  She tried Botox by Dr. Tyron Russell and [place] without benefit . Nasal oxygen increases the speed with which her headache pain relief occurs. She developed headaches in February 2012 and went to the emergency room. She went to the emergency room 10/19/213 with headaches. She continued to have headaches after her ER visit. They are worse at night than during the day. They are not associated with eyelid droop, nasal stuffiness, or tearing. She has gained weight on divalproex sodium.  Update 07/14/2015: [name], 65 year old female returns for follow-up. She continues to have frequent headaches almost on a daily basis and is on polypharmacy to include Depakote Topamax tizanidine magnesium oxide verapamil and Relpax. She woke up with a headache this morning however she did not take her Relpax. Her husband reports that she snores at night. She complains of significant daytime drowsiness and  scores 15 on ESS. She also complains of frequent awakening at night. She is wanting to decrease her Topamax as she has not had a seizure in many years. With the frequency of her headaches I'm not sure her medications are working for her anyway. She returns for reevaluation UPDATE November 08 2015: [name] 65 year old female returns for follow-up. She has a history of intractable headaches always occurring on the left side. She was originally evaluated at this office in 2001 by Dr. Erling Cruz . She has been tried on multiple medication regimens including Botox in the past see below. She continues to have 5-6 headaches per week. Her headaches might last 1-2 hours then reoccur during the day. She had a sleep study after my last visit with her which was negative for obstructive sleep apnea She went to the emergency room November 7 and received a migraine cocktail with relief. She had a headache the next day and took hydrocodone. She claims Relpax no longer works for her acutely. She is currently on Topamax, Depakote, verapamil and tizanidine as preventatives. She has also been on Reglan for many years and I noticed some abnormal movements of her face today. She apparently had a headache yesterday and took Tylenol Extra Strength the headache went away but she woke up with a headache this morning. Her headache today is rated between 8 and 9 on the pain scale .She did not take Relpax instead took tizanidine with no relief. On questioning her about her Tylenol she takes it on a daily basis 2 to 4 tabs she is probably getting some rebound effect. She has chronic renal disease most recent creatinine 1.21  Her review of medications that have been tried in the past include the following.  Analgesic, Tylenol Percocet Demerol Stadol Tylenol 3, Tylenol No. 4, Ultram Dilaudid and Norco Acute meds Amerge, D.H.E. Imitrex spray Maxalt Migranal Sansert, Zomig and Relpax Antihypertensives atenolol, Calan, currently on  verapamil Antinausea Phenergan Reglan Zofran Anti-inflammatory Indocin Celebrex diclofenac Vioxx Muscle relaxants baclofen Robaxin Flexeril currently on Zanaflex Anticonvulsants Lamictal gabapentin Lyrica Keppra and Zonegran, currently on Depakote Topamax and Horizant Steroids prednisone Tranquilizers/ sleepers Thorazine Zyprexa and Vistaril Antidepressant Elavil lithium Paxil Prozac and imipramine  Other currently on magnesium  She returns for reevaluation  UPDATE Jan 09 2016: She is with her husband at today's clinical visit, she reported that her headache overall is under better control, she was given SUMAtriptan Succinate (ONZETRA XSAIL) 11 MG/nasal spray, which has helped her symptoms,  she is having headaches 2-3 times each week, always on the left side, she also noticed mild right leg weakness, mild unsteady gait, Today we have personally reviewed MRI of the brain with without contrast in December 2016 Slow growth of 2 meningiomas since study of 2010. Left planum sphenoidale meningioma has increased from 5 x 7 mm to 11 x 12 mm. Left frontoparietal convexity meningioma has increased from 14 x 5 mm to 14 x 8 mm. She had a history of seizure in the past, last seizure was more than decade ago, but patient reported that her seizure tends to proceed with right arm and leg shaking UPDATE 04/27/2017[name], 65 year old female returns for follow-up. She has a history of migraine headaches and has tried multiple medications in the past. When I last saw her in November she was placed on Onzetra that has been beneficial for her acutely. She says her headaches are in good control at present. She was last seen by Dr. [name] in January MRI of the brain with contrast was reviewed and due to the fact that her meningioma had doubled in size she was referred to neurosurgeon [facility]. She continues her migraine preventatives of Depakote Topamax and tizanidine. She returns for reevaluation    REVIEW  OF SYSTEMS: Full 14 system review of systems performed and notable only for those listed, all others are neg:  Constitutional: neg  Cardiovascular: neg Ear/Nose/Throat: neg  Skin: neg Eyes: neg Respiratory: neg Gastroitestinal: neg  Hematology/Lymphatic: neg  Endocrine: neg Musculoskeletal:neg Allergy/Immunology: neg Neurological: History of migraines  Psychiatric: neg Sleep : neg   ALLERGIES: Allergies  Allergen Reactions  . Nsaids Other (See Comments)    Can cause ulcers  . Aspirin Other (See Comments) and Nausea Only    Could cause ulcers Could cause ulcers Could cause ulcers    HOME MEDICATIONS: Outpatient Prescriptions Prior to Visit  Medication Sig Dispense Refill  . atorvastatin (LIPITOR) 40 MG tablet Take 40 mg by mouth daily.    Marland Kitchen BUTRANS 7.5 MCG/HR PTWK Place 1 patch onto the skin once a week. Saturdays  5  . calcium-vitamin D (OSCAL WITH D) 250-125 MG-UNIT per tablet Take 1 tablet by mouth daily.    . divalproex (DEPAKOTE ER) 500 MG 24 hr tablet 1 tablet the morning, 2 tablets in the evening 90 tablet 0  . esomeprazole (NEXIUM) 40 MG capsule Take 40 mg by mouth daily at 12 noon.     . ferrous sulfate 325 (65 FE) MG tablet Take 325 mg by mouth daily with breakfast.    . magnesium oxide (MAG-OX) 400 MG tablet Take 400 mg by mouth 2 (two) times daily.     Marland Kitchen  Multiple Vitamin (MULTIVITAMIN WITH MINERALS) TABS Take 1 tablet by mouth daily.    Marland Kitchen olmesartan (BENICAR) 40 MG tablet Take 40 mg by mouth daily.    . ondansetron (ZOFRAN) 4 MG tablet Take 1 tablet (4 mg total) by mouth every 8 (eight) hours as needed for nausea or vomiting. 20 tablet 2  . OVER THE COUNTER MEDICATION Take 5 mLs by mouth 4 (four) times daily. Miracle Mouthwash-OTC    . sertraline (ZOLOFT) 25 MG tablet Take 1 tablet (25 mg total) by mouth daily. 30 tablet 3  . SUMAtriptan Succinate (ONZETRA XSAIL) 11 MG/NOSEPC EXHP Place 11 mg into the nose as needed. one per nostril at onset of migraine, may  repeat dose after 2 hours do not exceed 2 doses in 24 hours 2 each 3  . tiZANidine (ZANAFLEX) 2 MG tablet TAKE 1 TABLET BY MOUTH  TWICE A DAY 180 tablet 1  . topiramate (TOPAMAX) 100 MG tablet Take 1 tablet (100 mg total) by mouth daily. 90 tablet 3  . traMADol (ULTRAM) 50 MG tablet Take 50 mg by mouth 3 (three) times daily as needed (pain).   4  . traMADol (ULTRAM-ER) 100 MG 24 hr tablet Take 100 mg by mouth every evening.   0  . acetaminophen-codeine (TYLENOL #3) 300-30 MG tablet Take 1 tablet by mouth every 4 (four) hours as needed for moderate pain. 30 tablet 0  . cephALEXin (KEFLEX) 500 MG capsule Take 1 capsule (500 mg total) by mouth 2 (two) times daily. 15 capsule 0  . verapamil (VERELAN PM) 240 MG 24 hr capsule Take 240 mg by mouth at bedtime.      No facility-administered medications prior to visit.    PAST MEDICAL HISTORY: Past Medical History  Diagnosis Date  . High cholesterol   . Hypertension   . Type II diabetes mellitus ([place]) dx'd ~ 2014  . GERD (gastroesophageal reflux disease)   . Migraine     "daily" (06/16/2015)  . Epilepsy, grand mal ([place])     "last seizure ~ 2001" (06/16/2015)    PAST SURGICAL HISTORY: Past Surgical History  Procedure Laterality Date  . Laparoscopic incisional / umbilical / ventral hernia repair  06/16/2015    IHR w/mesh  . Hernia repair    . Vaginal hysterectomy  ~ 1992  . Incisional hernia repair N/A 06/16/2015    Procedure: LAPAROSCOPIC INCISIONAL HERNIA WITH MESH;  Surgeon: Coralie Keens, MD;  Location: [facility];  Service: General;  Laterality: N/A;  . Insertion of mesh N/A 06/16/2015    Procedure: INSERTION OF MESH;  Surgeon: Coralie Keens, MD;  Location: [facility];  Service: General;  Laterality: N/A;    FAMILY HISTORY: Family History  Problem Relation Age of Onset  . Lung cancer Mother   . Hypertension Other   . Kidney failure Father     SOCIAL HISTORY: Social History   Social History  . Marital Status: Married    Spouse Name:  Elenore Rota  . Number of Children: 3  . Years of Education: college   Occupational History  .      does not work   Social History Main Topics  . Smoking status: Never Smoker   . Smokeless tobacco: Never Used  . Alcohol Use: No  . Drug Use: No  . Sexual Activity: Not on file   Other Topics Concern  . Not on file   Social History Narrative   Patient lives at home with her husband Elenore Rota). Patient was a homemaker.  Right handed.   College education.   Caffeine- One cup of coffee daily.   Patient has three children.     PHYSICAL EXAM  Filed Vitals:   04/19/16 1124  BP: 98/78  Pulse: 83  Weight: 121 lb 6.4 oz (55.067 kg)  SpO2: 93%   Body mass index is 23.71 kg/(m^2).  Generalized: Well developed, in no acute distress  Head: normocephalic and atraumatic,. Oropharynx benign  Neck: Supple, no carotid bruits  Cardiac: Regular rate rhythm, no murmur  Musculoskeletal: No deformity   Neurological examination   Mentation: Alert oriented to time, place, history taking. Attention span and concentration appropriate. Recent and remote memory intact.  Follows all commands speech and language fluent.   Cranial nerve II-XII: Fundoscopic exam reveals sharp disc margins.Pupils were equal round reactive to light extraocular movements were full, visual field were full on confrontational test. Facial sensation and strength were normal. hearing was intact to finger rubbing bilaterally. Uvula tongue midline. head turning and shoulder shrug were normal and symmetric.Tongue protrusion into cheek strength was normal. Motor: normal bulk and tone, full strength in the BUE, BLE, fine finger movements normal, no pronator drift. No focal weakness Sensory: normal and symmetric to light touch, pinprick, and  Vibration, in the fingers and toes  Coordination: finger-nose-finger, heel-to-shin bilaterally, no dysmetria Reflexes: Brachioradialis 2/2, biceps 2/2, triceps 2/2, patellar 2/2, Achilles 2/2,  plantar responses were flexor bilaterally. Gait and Station: Rising up from seated position without assistance, normal stance,  moderate stride, good arm swing, smooth turning, able to perform tiptoe, and heel walking without difficulty. Tandem gait is steady. No assistive device  DIAGNOSTIC DATA (LABS, IMAGING, TESTING) - I reviewed patient records, labs, notes, testing and imaging myself where available.  Lab Results  Component Value Date   WBC 6.5 01/21/2016   HGB 11.8* 01/21/2016   HCT 36.9 01/21/2016   MCV 93.2 01/21/2016   PLT 192 01/21/2016      Component Value Date/Time   NA 140 01/21/2016 2109   K 4.5 01/21/2016 2109   CL 104 01/21/2016 2109   CO2 27 01/21/2016 2109   GLUCOSE 103* 01/21/2016 2109   BUN 9 01/21/2016 2109   CREATININE 1.29* 01/21/2016 2109   CALCIUM 9.4 01/21/2016 2109   PROT 6.0* 01/21/2016 2109   ALBUMIN 3.1* 01/21/2016 2109   AST 25 01/21/2016 2109   ALT 15 01/21/2016 2109   ALKPHOS 54 01/21/2016 2109   BILITOT 0.3 01/21/2016 2109   GFRNONAA 42* 01/21/2016 2109   GFRAA 6* 01/21/2016 2109     ASSESSMENT AND PLAN  65 y.o. year old female  has a past medical history of High cholesterol; Hypertension; Type II diabetes mellitus ([place]) (dx'd ~ 2014); GERD (gastroesophageal reflux disease); Migraine; and Epilepsy, grand mal ([place]). And left frontal and parietal meningioma which has doubled in size in the past few years she was sent for second opinion by Dr. [name].  Continue current medications for migraine, Depakote, Topamax and tizanidine Onzetra acutely Will refill Tizanidine  Follow up in 6 months Dennie Bible, [facility], [facility], APRN  [facility] [address], [place], [place] [address] [phone]

## 2016-04-19 NOTE — Patient Instructions (Signed)
Continue current medications for migraine Will refill Tizanidine  Follow up in 6 months

## 2016-04-20 DIAGNOSIS — N3941 Urge incontinence: Secondary | ICD-10-CM | POA: Diagnosis not present

## 2016-04-20 DIAGNOSIS — N3944 Nocturnal enuresis: Secondary | ICD-10-CM | POA: Diagnosis not present

## 2016-04-20 DIAGNOSIS — R351 Nocturia: Secondary | ICD-10-CM | POA: Diagnosis not present

## 2016-04-23 NOTE — Progress Notes (Signed)
I have reviewed and agreed above plan. 

## 2016-04-24 DIAGNOSIS — E871 Hypo-osmolality and hyponatremia: Secondary | ICD-10-CM | POA: Diagnosis not present

## 2016-05-07 DIAGNOSIS — M7541 Impingement syndrome of right shoulder: Secondary | ICD-10-CM | POA: Diagnosis not present

## 2016-05-07 DIAGNOSIS — M7501 Adhesive capsulitis of right shoulder: Secondary | ICD-10-CM | POA: Diagnosis not present

## 2016-05-07 DIAGNOSIS — M542 Cervicalgia: Secondary | ICD-10-CM | POA: Diagnosis not present

## 2016-05-07 DIAGNOSIS — M25511 Pain in right shoulder: Secondary | ICD-10-CM | POA: Diagnosis not present

## 2016-05-10 DIAGNOSIS — R1084 Generalized abdominal pain: Secondary | ICD-10-CM | POA: Diagnosis not present

## 2016-05-14 ENCOUNTER — Ambulatory Visit
Admission: RE | Admit: 2016-05-14 | Discharge: 2016-05-14 | Disposition: A | Discharge status: Home / Self Care | Payer: Medicare Other | Source: Ambulatory Visit

## 2016-05-14 DIAGNOSIS — Z1231 Encounter for screening mammogram for malignant neoplasm of breast: Secondary | ICD-10-CM

## 2016-05-28 DIAGNOSIS — R350 Frequency of micturition: Secondary | ICD-10-CM | POA: Diagnosis not present

## 2016-05-28 DIAGNOSIS — N3941 Urge incontinence: Secondary | ICD-10-CM | POA: Diagnosis not present

## 2016-06-08 ENCOUNTER — Telehealth: Payer: Self-pay | Admitting: Nurse Practitioner

## 2016-06-08 MED ORDER — DIVALPROEX SODIUM ER 500 MG PO TB24: ORAL_TABLET | ORAL | Status: DC

## 2016-06-08 NOTE — Telephone Encounter (Signed)
Done.  (one month supply) at local pharmacy.

## 2016-06-08 NOTE — Telephone Encounter (Signed)
Pt called request refill for divalproex (DEPAKOTE ER) 500 MG 24 hr tablet [facility] [address]  Pt said she has 3 days left, will need this RX for the weekend. She said RX has expired

## 2016-07-09 ENCOUNTER — Other Ambulatory Visit: Payer: Self-pay | Admitting: Nurse Practitioner

## 2016-07-09 MED ORDER — DIVALPROEX SODIUM ER 500 MG PO TB24: ORAL_TABLET | ORAL | Status: DC

## 2016-07-09 NOTE — Telephone Encounter (Signed)
Pt is not on dilantin.  Is taking depakote 500mg  1 tablet in AM and 2 tabs in PM.

## 2016-07-09 NOTE — Telephone Encounter (Signed)
Pt called sts she is requesting a 90 day supply of phenytoin (DILANTIN) 100 MG ER capsule. sts she requested that but when she picked up RX it was for 30 day supply of 90 tablets. [facility]

## 2016-07-09 NOTE — Telephone Encounter (Signed)
I called pt and confirmed that depakote is the medication that was called into [facility] [address].  She confirmed.  I relayed that it is 270 tabs (3 mo supply).

## 2016-07-11 DIAGNOSIS — I10 Essential (primary) hypertension: Secondary | ICD-10-CM | POA: Diagnosis not present

## 2016-07-11 DIAGNOSIS — E785 Hyperlipidemia, unspecified: Secondary | ICD-10-CM | POA: Diagnosis not present

## 2016-07-11 DIAGNOSIS — N182 Chronic kidney disease, stage 2 (mild): Secondary | ICD-10-CM | POA: Diagnosis not present

## 2016-07-11 DIAGNOSIS — Z0001 Encounter for general adult medical examination with abnormal findings: Secondary | ICD-10-CM | POA: Diagnosis not present

## 2016-07-11 DIAGNOSIS — G43909 Migraine, unspecified, not intractable, without status migrainosus: Secondary | ICD-10-CM | POA: Diagnosis not present

## 2016-07-11 DIAGNOSIS — F320 Major depressive disorder, single episode, mild: Secondary | ICD-10-CM | POA: Diagnosis not present

## 2016-07-11 DIAGNOSIS — Z23 Encounter for immunization: Secondary | ICD-10-CM | POA: Diagnosis not present

## 2016-07-11 DIAGNOSIS — K219 Gastro-esophageal reflux disease without esophagitis: Secondary | ICD-10-CM | POA: Diagnosis not present

## 2016-07-15 ENCOUNTER — Other Ambulatory Visit: Payer: Self-pay | Admitting: Nurse Practitioner

## 2016-07-18 DIAGNOSIS — N9089 Other specified noninflammatory disorders of vulva and perineum: Secondary | ICD-10-CM | POA: Diagnosis not present

## 2016-07-19 DIAGNOSIS — D329 Benign neoplasm of meninges, unspecified: Secondary | ICD-10-CM | POA: Diagnosis not present

## 2016-07-19 DIAGNOSIS — R413 Other amnesia: Secondary | ICD-10-CM | POA: Insufficient documentation

## 2016-07-19 DIAGNOSIS — N189 Chronic kidney disease, unspecified: Secondary | ICD-10-CM | POA: Diagnosis not present

## 2016-07-19 DIAGNOSIS — E1122 Type 2 diabetes mellitus with diabetic chronic kidney disease: Secondary | ICD-10-CM | POA: Diagnosis not present

## 2016-07-19 DIAGNOSIS — I129 Hypertensive chronic kidney disease with stage 1 through stage 4 chronic kidney disease, or unspecified chronic kidney disease: Secondary | ICD-10-CM | POA: Diagnosis not present

## 2016-08-01 DIAGNOSIS — N3941 Urge incontinence: Secondary | ICD-10-CM | POA: Diagnosis not present

## 2016-08-07 DIAGNOSIS — F322 Major depressive disorder, single episode, severe without psychotic features: Secondary | ICD-10-CM | POA: Diagnosis not present

## 2016-08-16 ENCOUNTER — Telehealth: Payer: Self-pay | Admitting: Nurse Practitioner

## 2016-08-16 DIAGNOSIS — G43809 Other migraine, not intractable, without status migrainosus: Secondary | ICD-10-CM

## 2016-08-16 NOTE — Telephone Encounter (Addendum)
Patient called to request prescription for Cefaly ( device you wear on your head that gives a shock for migraine).

## 2016-08-16 NOTE — Telephone Encounter (Signed)
RX entered and printed

## 2016-08-17 NOTE — Telephone Encounter (Signed)
Spoke to pt and asked if she wanted me to fax prescription and she said yes.  (demographics and insurance) too.  Done. Fax confirmation received.

## 2016-08-20 DIAGNOSIS — R350 Frequency of micturition: Secondary | ICD-10-CM | POA: Diagnosis not present

## 2016-08-20 DIAGNOSIS — N3941 Urge incontinence: Secondary | ICD-10-CM | POA: Diagnosis not present

## 2016-08-21 DIAGNOSIS — G8929 Other chronic pain: Secondary | ICD-10-CM | POA: Diagnosis not present

## 2016-08-21 DIAGNOSIS — R51 Headache: Secondary | ICD-10-CM | POA: Diagnosis not present

## 2016-08-21 DIAGNOSIS — Z79899 Other long term (current) drug therapy: Secondary | ICD-10-CM | POA: Diagnosis not present

## 2016-08-21 DIAGNOSIS — G43009 Migraine without aura, not intractable, without status migrainosus: Secondary | ICD-10-CM | POA: Diagnosis not present

## 2016-08-22 DIAGNOSIS — I10 Essential (primary) hypertension: Secondary | ICD-10-CM | POA: Diagnosis not present

## 2016-08-23 DIAGNOSIS — F322 Major depressive disorder, single episode, severe without psychotic features: Secondary | ICD-10-CM | POA: Diagnosis not present

## 2016-09-03 DIAGNOSIS — R350 Frequency of micturition: Secondary | ICD-10-CM | POA: Diagnosis not present

## 2016-09-03 DIAGNOSIS — N3944 Nocturnal enuresis: Secondary | ICD-10-CM | POA: Diagnosis not present

## 2016-09-05 DIAGNOSIS — G9340 Encephalopathy, unspecified: Secondary | ICD-10-CM | POA: Diagnosis not present

## 2016-09-05 DIAGNOSIS — R413 Other amnesia: Secondary | ICD-10-CM | POA: Diagnosis not present

## 2016-09-05 DIAGNOSIS — D329 Benign neoplasm of meninges, unspecified: Secondary | ICD-10-CM | POA: Diagnosis not present

## 2016-09-05 DIAGNOSIS — R9401 Abnormal electroencephalogram [EEG]: Secondary | ICD-10-CM | POA: Diagnosis not present

## 2016-09-12 DIAGNOSIS — Z01411 Encounter for gynecological examination (general) (routine) with abnormal findings: Secondary | ICD-10-CM | POA: Diagnosis not present

## 2016-09-12 DIAGNOSIS — R1900 Intra-abdominal and pelvic swelling, mass and lump, unspecified site: Secondary | ICD-10-CM | POA: Diagnosis not present

## 2016-09-14 DIAGNOSIS — N390 Urinary tract infection, site not specified: Secondary | ICD-10-CM | POA: Diagnosis not present

## 2016-09-14 DIAGNOSIS — R8271 Bacteriuria: Secondary | ICD-10-CM | POA: Diagnosis not present

## 2016-09-14 DIAGNOSIS — R300 Dysuria: Secondary | ICD-10-CM | POA: Diagnosis not present

## 2016-09-17 DIAGNOSIS — R1904 Left lower quadrant abdominal swelling, mass and lump: Secondary | ICD-10-CM | POA: Diagnosis not present

## 2016-09-18 DIAGNOSIS — G43009 Migraine without aura, not intractable, without status migrainosus: Secondary | ICD-10-CM | POA: Diagnosis not present

## 2016-09-18 DIAGNOSIS — G8929 Other chronic pain: Secondary | ICD-10-CM | POA: Diagnosis not present

## 2016-09-18 DIAGNOSIS — R51 Headache: Secondary | ICD-10-CM | POA: Diagnosis not present

## 2016-09-18 DIAGNOSIS — Z79899 Other long term (current) drug therapy: Secondary | ICD-10-CM | POA: Diagnosis not present

## 2016-09-20 DIAGNOSIS — H2513 Age-related nuclear cataract, bilateral: Secondary | ICD-10-CM | POA: Diagnosis not present

## 2016-09-21 ENCOUNTER — Other Ambulatory Visit: Payer: Self-pay | Admitting: Surgery

## 2016-09-21 DIAGNOSIS — K432 Incisional hernia without obstruction or gangrene: Secondary | ICD-10-CM | POA: Diagnosis not present

## 2016-09-25 DIAGNOSIS — G40909 Epilepsy, unspecified, not intractable, without status epilepticus: Secondary | ICD-10-CM | POA: Diagnosis not present

## 2016-09-25 DIAGNOSIS — D420 Neoplasm of uncertain behavior of cerebral meninges: Secondary | ICD-10-CM | POA: Diagnosis not present

## 2016-09-25 DIAGNOSIS — R51 Headache: Secondary | ICD-10-CM | POA: Diagnosis not present

## 2016-09-28 DIAGNOSIS — K432 Incisional hernia without obstruction or gangrene: Secondary | ICD-10-CM | POA: Diagnosis not present

## 2016-10-05 DIAGNOSIS — H2513 Age-related nuclear cataract, bilateral: Secondary | ICD-10-CM | POA: Diagnosis not present

## 2016-10-05 DIAGNOSIS — H25043 Posterior subcapsular polar age-related cataract, bilateral: Secondary | ICD-10-CM | POA: Diagnosis not present

## 2016-10-05 DIAGNOSIS — H25013 Cortical age-related cataract, bilateral: Secondary | ICD-10-CM | POA: Diagnosis not present

## 2016-10-05 DIAGNOSIS — H52203 Unspecified astigmatism, bilateral: Secondary | ICD-10-CM | POA: Diagnosis not present

## 2016-10-11 ENCOUNTER — Encounter (HOSPITAL_COMMUNITY): Payer: Self-pay

## 2016-10-11 ENCOUNTER — Other Ambulatory Visit: Payer: Self-pay

## 2016-10-11 ENCOUNTER — Encounter (HOSPITAL_COMMUNITY)
Admission: RE | Admit: 2016-10-11 | Discharge: 2016-10-11 | Disposition: A | Discharge status: Home / Self Care | Payer: Medicare Other | Source: Ambulatory Visit | Attending: Surgery | Admitting: Surgery

## 2016-10-11 ENCOUNTER — Other Ambulatory Visit: Payer: Self-pay | Admitting: Nurse Practitioner

## 2016-10-11 DIAGNOSIS — Z01810 Encounter for preprocedural cardiovascular examination: Secondary | ICD-10-CM | POA: Diagnosis not present

## 2016-10-11 DIAGNOSIS — Z01812 Encounter for preprocedural laboratory examination: Secondary | ICD-10-CM | POA: Insufficient documentation

## 2016-10-11 DIAGNOSIS — K432 Incisional hernia without obstruction or gangrene: Secondary | ICD-10-CM | POA: Diagnosis not present

## 2016-10-11 HISTORY — DX: Prediabetes: R73.03

## 2016-10-11 HISTORY — DX: Reaction to severe stress, unspecified: F43.9

## 2016-10-11 LAB — BASIC METABOLIC PANEL
Anion gap: 6 (ref 5–15)
BUN: 14 mg/dL (ref 6–20)
CO2: 27 mmol/L (ref 22–32)
Calcium: 9.9 mg/dL (ref 8.9–10.3)
Chloride: 106 mmol/L (ref 101–111)
Creatinine, Ser: 0.93 mg/dL (ref 0.44–1.00)
GFR calc Af Amer: >60 mL/min (ref >60)
GFR calc non Af Amer: >60 mL/min (ref >60)
Glucose, Bld: 94 mg/dL (ref 65–99)
Potassium: 3.7 mmol/L (ref 3.5–5.1)
Sodium: 139 mmol/L (ref 135–145)

## 2016-10-11 LAB — CBC
HCT: 38.2 % (ref 36.0–46.0)
Hemoglobin: 12.5 g/dL (ref 12.0–15.0)
MCH: 30.6 pg (ref 26.0–34.0)
MCHC: 32.7 g/dL (ref 30.0–36.0)
MCV: 93.6 fL (ref 78.0–100.0)
Platelets: 265 10*3/uL (ref 150–400)
RBC: 4.08 MIL/uL (ref 3.87–5.11)
RDW: 13.4 % (ref 11.5–15.5)
WBC: 8.9 10*3/uL (ref 4.0–10.5)

## 2016-10-11 NOTE — Progress Notes (Signed)
Pt. In the PAT office alone, husband in waiting room. Pt. Remarked that she was tired & she has confusion sometimes. Pt. Remarks that she can do local errands on her own if its somewhere she is familiar with. She needed several times for the instructions to be repeated for the shower & then even after she repeated them correct, she later repeated them incorrect. Pt. Acts weak, walks very slow. Pt. Admits to a lot of stress & there were given several openers for her to express what was on her mind & she resisted revealing anything.

## 2016-10-11 NOTE — Pre-Procedure Instructions (Signed)
[name]  10/11/2016      [id number] [name], [name] [address] Phone: [phone] Fax: [phone]  [address], [place] [address] Phone: [phone] Fax: [phone]    Your procedure is scheduled on: 10/15/2016  Report to [facility] Admitting at 8:00 A.M.  Call this number if you have problems the morning of surgery:  [phone]   Remember:  Do not eat food or drink liquids after midnight.  Take these medicines the morning of surgery with A SIP OF WATER : Amlodipine, Depakote, Nexium.....Marland Kitchenyou may also take Tramadol if needed   Do not wear jewelry, make-up or nail polish.  Do not wear lotions, powders, or perfumes, or deoderant.  Do not shave 48 hours prior to surgery.     Do not bring valuables to the hospital.  [facility] is not responsible for any belongings or valuables.  Contacts, dentures or bridgework may not be worn into surgery.  Leave your suitcase in the car.  After surgery it may be brought to your room.  For patients admitted to the hospital, discharge time will be determined by your treatment team.  Patients discharged the day of surgery will not be allowed to drive home.   Name and phone number of your driver:   With spouse  Special instructions:  Special Instructions:  - Preparing for Surgery  Before surgery, you can play an important role.  Because skin is not sterile, your skin needs to be as free of germs as possible.  You can reduce the number of germs on you skin by washing with CHG (chlorahexidine gluconate) soap before surgery.  CHG is an antiseptic cleaner which kills germs and bonds with the skin to continue killing germs even after washing.  Please DO NOT use if you have an allergy to CHG or antibacterial soaps.  If your skin becomes reddened/irritated stop using the CHG  and inform your nurse when you arrive at Short Stay.  Do not shave (including legs and underarms) for at least 48 hours prior to the first CHG shower.  You may shave your face.  Please follow these instructions carefully:   1.  Shower with CHG Soap the night before surgery and the  morning of Surgery.  2.  If you choose to wash your hair, wash your hair first as usual with your  normal shampoo.  3.  After you shampoo, rinse your hair and body thoroughly to remove the  Shampoo.  4.  Use CHG as you would any other liquid soap.  You can apply chg directly to the skin and wash gently with scrungie or a clean washcloth.  5.  Apply the CHG Soap to your body ONLY FROM THE NECK DOWN.    Do not use on open wounds or open sores.  Avoid contact with your eyes, ears, mouth and genitals (private parts).  Wash genitals (private parts)   with your normal soap.  6.  Wash thoroughly, paying special attention to the area where your surgery will be performed.  7.  Thoroughly rinse your body with warm water from the neck down.  8.  DO NOT shower/wash with your normal soap after using and rinsing off   the CHG Soap.  9.  Pat yourself dry with a clean towel.  10.  Wear clean pajamas.            11.  Place clean sheets on your bed the night of your first shower and do not sleep with pets.  Day of Surgery  Do not apply any lotions/deodorants the morning of surgery.  Please wear clean clothes to the hospital/surgery center.  Please read over the following fact sheets that you were given. Pain Booklet, Coughing and Deep Breathing and Surgical Site Infection Prevention

## 2016-10-11 NOTE — Progress Notes (Addendum)
Pt. Denies chest & breathing concerns. Pt. Denies being followed by cardiac, PCP is C. Smith & the neurology concerns are being address by Dr. [name] & she has seen another neurologist for another opinion in [place].

## 2016-10-14 NOTE — H&P (Addendum)
  [facility]  Location: [facility] Patient #: [id number] DOB: [birth year] Married / Language: English / Race: Black or African American Female   History of Present Illness  Patient words: RECHECK HERNIA.  The patient is a 65 year old female who presents with an incisional hernia. She is here for a follow-up visit. She is status post laparoscopic incisional hernia repair with mesh in June 2016. She had a hernia at her midline no the umbilicus as well as a separate hernia down at the pubis. Unfortunately, the one at the pubis has recurred. She has been having increasing swelling in this area and some constipation. A CT scan in January 2017 demonstrated the hernia. The other hernia site remained intact   Allergies Mammie Lorenzo, LPN; Aspirin *ANALGESICS - NonNarcotic*  Medication History Mammie Lorenzo, LPN; Divalproex Sodium ER (500MG  Tablet ER 24HR, Oral two times daily) Active. TraMADol HCl (50MG  Tablet, Oral) Active. Atorvastatin Calcium (40MG  Tablet, Oral) Active. Butrans (10MCG/HR Patch Weekly, Transdermal) Active. AmLODIPine Besylate (5MG  Tablet, Oral) Active. Escitalopram Oxalate (10MG  Tablet, Oral) Active. Toviaz (8MG  Tablet ER 24HR, Oral) Active. Topiramate (100MG  Tablet, Oral) Active. Debara Pickett (11MG /NOSEPC Exhaler Powd, Nasal) Active. Medications Reconciled Iron (Ferrous Gluconate) (325MG  Tablet, Oral) Active. Zanaflex (2MG  Capsule, Oral) Active.  Vitals   Weight: 119.8 lb Height: 60in Body Surface Area: 1.5 m Body Mass Index: 23.4 kg/m  Temp.: 97.68F(Oral)  Pulse: 82 (Regular)  BP: 124/80 (Sitting, Left Arm, Standard   Physical Exam   The physical exam findings are as follows: Note:On examination, there is indeed a reducible, nontender hernia just to the left of the midline at the pubis. The other hernia repair site remains intact Generally well in appearance Lungs clear CV RRR Abdomen otherwise soft,  non-tender Skin without erythema or rash   Assessment & Plan   INCISIONAL HERNIA (K43.2)  Impression: I discussed the diagnosis with the patient and her husband. I discussed the risk of the hernia getting larger as well as strangulation given its location if repair was not performed. I discussed open incisional hernia repair with mesh with him in detail. They understand and wish to proceed with surgery. I also discussed the risks and detail.

## 2016-10-15 ENCOUNTER — Encounter (HOSPITAL_COMMUNITY)
Admission: RE | Disposition: A | Discharge status: Home / Self Care | Payer: Self-pay | Source: Ambulatory Visit | Attending: Surgery

## 2016-10-15 ENCOUNTER — Inpatient Hospital Stay (HOSPITAL_COMMUNITY): Payer: Medicare Other | Admitting: Anesthesiology

## 2016-10-15 ENCOUNTER — Encounter (HOSPITAL_COMMUNITY): Payer: Self-pay | Admitting: General Practice

## 2016-10-15 ENCOUNTER — Observation Stay (HOSPITAL_COMMUNITY)
Admission: RE | Admit: 2016-10-15 | Discharge: 2016-10-16 | Disposition: A | Discharge status: Home / Self Care | Payer: Medicare Other | Source: Ambulatory Visit | Attending: Surgery | Admitting: Surgery

## 2016-10-15 DIAGNOSIS — K219 Gastro-esophageal reflux disease without esophagitis: Secondary | ICD-10-CM | POA: Insufficient documentation

## 2016-10-15 DIAGNOSIS — Z886 Allergy status to analgesic agent status: Secondary | ICD-10-CM | POA: Diagnosis not present

## 2016-10-15 DIAGNOSIS — I10 Essential (primary) hypertension: Secondary | ICD-10-CM | POA: Diagnosis not present

## 2016-10-15 DIAGNOSIS — F419 Anxiety disorder, unspecified: Secondary | ICD-10-CM | POA: Insufficient documentation

## 2016-10-15 DIAGNOSIS — Z888 Allergy status to other drugs, medicaments and biological substances status: Secondary | ICD-10-CM | POA: Insufficient documentation

## 2016-10-15 DIAGNOSIS — K432 Incisional hernia without obstruction or gangrene: Principal | ICD-10-CM | POA: Diagnosis present

## 2016-10-15 DIAGNOSIS — F329 Major depressive disorder, single episode, unspecified: Secondary | ICD-10-CM | POA: Diagnosis not present

## 2016-10-15 HISTORY — DX: Depression, unspecified: F32.A

## 2016-10-15 HISTORY — DX: Major depressive disorder, single episode, unspecified: F32.9

## 2016-10-15 HISTORY — PX: INCISIONAL HERNIA REPAIR: SHX193

## 2016-10-15 SURGERY — REPAIR, HERNIA, INCISIONAL
Anesthesia: General | Site: Abdomen

## 2016-10-15 MED ORDER — CHLORHEXIDINE GLUCONATE CLOTH 2 % EX PADS: 6.0000 | MEDICATED_PAD | Freq: Once | CUTANEOUS | Status: DC

## 2016-10-15 MED ORDER — ROCURONIUM BROMIDE 100 MG/10ML IV SOLN
INTRAVENOUS | Status: DC | PRN
Start: 2016-10-15 — End: 2016-10-15
  Administered 2016-10-15: 40 mg via INTRAVENOUS

## 2016-10-15 MED ORDER — PROMETHAZINE HCL 25 MG/ML IJ SOLN: 6.2500 mg | INTRAMUSCULAR | Status: DC | PRN

## 2016-10-15 MED ORDER — MORPHINE SULFATE (PF) 2 MG/ML IV SOLN
1.0000 mg | INTRAVENOUS | Status: DC | PRN
  Administered 2016-10-15: 2 mg via INTRAVENOUS
  Filled 2016-10-15: qty 1

## 2016-10-15 MED ORDER — POTASSIUM CHLORIDE IN NACL 20-0.9 MEQ/L-% IV SOLN
INTRAVENOUS | Status: DC
  Administered 2016-10-15 – 2016-10-16 (×2): via INTRAVENOUS
  Filled 2016-10-15 (×2): qty 1000

## 2016-10-15 MED ORDER — LACTATED RINGERS IV SOLN
INTRAVENOUS | Status: DC
Start: 2016-10-15 — End: 2016-10-15
  Administered 2016-10-15 (×2): via INTRAVENOUS

## 2016-10-15 MED ORDER — SUGAMMADEX SODIUM 200 MG/2ML IV SOLN
INTRAVENOUS | Status: AC
  Filled 2016-10-15: qty 2

## 2016-10-15 MED ORDER — ROCURONIUM BROMIDE 10 MG/ML (PF) SYRINGE
PREFILLED_SYRINGE | INTRAVENOUS | Status: AC
  Filled 2016-10-15: qty 30

## 2016-10-15 MED ORDER — MEPERIDINE HCL 25 MG/ML IJ SOLN: 6.2500 mg | INTRAMUSCULAR | Status: DC | PRN

## 2016-10-15 MED ORDER — EPHEDRINE 5 MG/ML INJ
INTRAVENOUS | Status: AC
  Filled 2016-10-15: qty 10

## 2016-10-15 MED ORDER — AMLODIPINE BESYLATE 5 MG PO TABS
5.0000 mg | ORAL_TABLET | Freq: Every day | ORAL | Status: DC
  Filled 2016-10-15: qty 1

## 2016-10-15 MED ORDER — CEFAZOLIN SODIUM-DEXTROSE 2-4 GM/100ML-% IV SOLN
INTRAVENOUS | Status: AC
  Filled 2016-10-15: qty 100

## 2016-10-15 MED ORDER — MIDAZOLAM HCL 5 MG/5ML IJ SOLN
INTRAMUSCULAR | Status: DC | PRN
  Administered 2016-10-15: 2 mg via INTRAVENOUS

## 2016-10-15 MED ORDER — MIDAZOLAM HCL 2 MG/2ML IJ SOLN
INTRAMUSCULAR | Status: AC
  Filled 2016-10-15: qty 2

## 2016-10-15 MED ORDER — LIDOCAINE 2% (20 MG/ML) 5 ML SYRINGE
INTRAMUSCULAR | Status: AC
  Filled 2016-10-15: qty 20

## 2016-10-15 MED ORDER — SUGAMMADEX SODIUM 200 MG/2ML IV SOLN
INTRAVENOUS | Status: DC | PRN
  Administered 2016-10-15: 200 mg via INTRAVENOUS

## 2016-10-15 MED ORDER — BUPIVACAINE-EPINEPHRINE 0.25% -1:200000 IJ SOLN
INTRAMUSCULAR | Status: DC | PRN
  Administered 2016-10-15: 20 mL

## 2016-10-15 MED ORDER — MIDAZOLAM HCL 2 MG/2ML IJ SOLN: 0.5000 mg | Freq: Once | INTRAMUSCULAR | Status: DC | PRN

## 2016-10-15 MED ORDER — OXYCODONE HCL 5 MG PO TABS
5.0000 mg | ORAL_TABLET | ORAL | Status: DC | PRN
  Administered 2016-10-15: 10 mg via ORAL
  Administered 2016-10-15: 5 mg via ORAL
  Administered 2016-10-16: 10 mg via ORAL
  Filled 2016-10-15 (×2): qty 2
  Filled 2016-10-15: qty 1

## 2016-10-15 MED ORDER — FENTANYL CITRATE (PF) 100 MCG/2ML IJ SOLN
INTRAMUSCULAR | Status: DC | PRN
Start: 2016-10-15 — End: 2016-10-15
  Administered 2016-10-15 (×2): 50 ug via INTRAVENOUS
  Administered 2016-10-15: 100 ug via INTRAVENOUS

## 2016-10-15 MED ORDER — OXYCODONE HCL 5 MG PO TABS
ORAL_TABLET | ORAL | Status: AC
  Filled 2016-10-15: qty 2

## 2016-10-15 MED ORDER — HYDROMORPHONE HCL 1 MG/ML IJ SOLN
0.2500 mg | INTRAMUSCULAR | Status: DC | PRN
  Administered 2016-10-15 (×3): 0.5 mg via INTRAVENOUS

## 2016-10-15 MED ORDER — ONDANSETRON HCL 4 MG/2ML IJ SOLN
INTRAMUSCULAR | Status: DC | PRN
  Administered 2016-10-15: 4 mg via INTRAVENOUS

## 2016-10-15 MED ORDER — HYDROMORPHONE HCL 1 MG/ML IJ SOLN
INTRAMUSCULAR | Status: AC
  Filled 2016-10-15: qty 0.5

## 2016-10-15 MED ORDER — PROPOFOL 10 MG/ML IV BOLUS
INTRAVENOUS | Status: AC
  Filled 2016-10-15: qty 20

## 2016-10-15 MED ORDER — ACETAMINOPHEN 325 MG PO TABS
650.0000 mg | ORAL_TABLET | Freq: Four times a day (QID) | ORAL | Status: DC | PRN
  Administered 2016-10-15 – 2016-10-16 (×2): 650 mg via ORAL
  Filled 2016-10-15 (×2): qty 2

## 2016-10-15 MED ORDER — ONDANSETRON 4 MG PO TBDP: 4.0000 mg | ORAL_TABLET | Freq: Four times a day (QID) | ORAL | Status: DC | PRN

## 2016-10-15 MED ORDER — IRBESARTAN 75 MG PO TABS
37.5000 mg | ORAL_TABLET | Freq: Every day | ORAL | Status: DC
  Administered 2016-10-15 – 2016-10-16 (×2): 37.5 mg via ORAL
  Filled 2016-10-15 (×2): qty 1

## 2016-10-15 MED ORDER — 0.9 % SODIUM CHLORIDE (POUR BTL) OPTIME
TOPICAL | Status: DC | PRN
  Administered 2016-10-15: 1000 mL

## 2016-10-15 MED ORDER — ENOXAPARIN SODIUM 40 MG/0.4ML ~~LOC~~ SOLN
40.0000 mg | SUBCUTANEOUS | Status: DC
Start: 2016-10-16 — End: 2016-10-16
  Filled 2016-10-15: qty 0.4

## 2016-10-15 MED ORDER — BUPIVACAINE HCL (PF) 0.25 % IJ SOLN
INTRAMUSCULAR | Status: AC
  Filled 2016-10-15: qty 30

## 2016-10-15 MED ORDER — TOPIRAMATE 100 MG PO TABS
100.0000 mg | ORAL_TABLET | Freq: Every day | ORAL | Status: DC
  Administered 2016-10-15: 100 mg via ORAL
  Filled 2016-10-15: qty 1

## 2016-10-15 MED ORDER — FESOTERODINE FUMARATE ER 8 MG PO TB24
8.0000 mg | ORAL_TABLET | Freq: Every day | ORAL | Status: DC
  Administered 2016-10-15: 8 mg via ORAL
  Filled 2016-10-15: qty 1

## 2016-10-15 MED ORDER — CEFAZOLIN SODIUM-DEXTROSE 2-4 GM/100ML-% IV SOLN
2.0000 g | INTRAVENOUS | Status: AC
  Administered 2016-10-15: 2 g via INTRAVENOUS

## 2016-10-15 MED ORDER — PROPOFOL 10 MG/ML IV BOLUS
INTRAVENOUS | Status: DC | PRN
  Administered 2016-10-15: 120 mg via INTRAVENOUS

## 2016-10-15 MED ORDER — LIDOCAINE HCL (CARDIAC) 20 MG/ML IV SOLN
INTRAVENOUS | Status: DC | PRN
  Administered 2016-10-15: 20 mg via INTRAVENOUS

## 2016-10-15 MED ORDER — ACETAMINOPHEN 650 MG RE SUPP: 650.0000 mg | Freq: Four times a day (QID) | RECTAL | Status: DC | PRN

## 2016-10-15 MED ORDER — HYDROMORPHONE HCL 1 MG/ML IJ SOLN
INTRAMUSCULAR | Status: AC
  Filled 2016-10-15: qty 1

## 2016-10-15 MED ORDER — DIVALPROEX SODIUM ER 500 MG PO TB24
500.0000 mg | ORAL_TABLET | Freq: Every day | ORAL | Status: DC
  Administered 2016-10-16: 500 mg via ORAL
  Filled 2016-10-15: qty 1

## 2016-10-15 MED ORDER — ENSURE ENLIVE PO LIQD
237.0000 mL | Freq: Two times a day (BID) | ORAL | Status: DC
  Administered 2016-10-15: 237 mL via ORAL

## 2016-10-15 MED ORDER — ONDANSETRON HCL 4 MG/2ML IJ SOLN
4.0000 mg | Freq: Four times a day (QID) | INTRAMUSCULAR | Status: DC | PRN
  Administered 2016-10-15: 4 mg via INTRAVENOUS
  Filled 2016-10-15: qty 2

## 2016-10-15 MED ORDER — ONDANSETRON HCL 4 MG/2ML IJ SOLN
INTRAMUSCULAR | Status: AC
  Filled 2016-10-15: qty 4

## 2016-10-15 MED ORDER — PHENYLEPHRINE 40 MCG/ML (10ML) SYRINGE FOR IV PUSH (FOR BLOOD PRESSURE SUPPORT)
PREFILLED_SYRINGE | INTRAVENOUS | Status: AC
  Filled 2016-10-15: qty 10

## 2016-10-15 MED ORDER — FENTANYL CITRATE (PF) 100 MCG/2ML IJ SOLN
INTRAMUSCULAR | Status: AC
  Filled 2016-10-15: qty 4

## 2016-10-15 SURGICAL SUPPLY — 36 items
ADH SKN CLS APL DERMABOND .7 (GAUZE/BANDAGES/DRESSINGS) ×1
BLADE SURG ROTATE 9660 (MISCELLANEOUS) IMPLANT
CANISTER SUCTION 2500CC (MISCELLANEOUS) ×2 IMPLANT
CHLORAPREP W/TINT 26ML (MISCELLANEOUS) ×2 IMPLANT
COVER SURGICAL LIGHT HANDLE (MISCELLANEOUS) ×2 IMPLANT
DERMABOND ADVANCED (GAUZE/BANDAGES/DRESSINGS) ×1
DERMABOND ADVANCED .7 DNX12 (GAUZE/BANDAGES/DRESSINGS) ×1 IMPLANT
DRAPE LAPAROSCOPIC ABDOMINAL (DRAPES) ×2 IMPLANT
DRAPE UTILITY XL STRL (DRAPES) ×4 IMPLANT
DRSG TEGADERM 4X4.75 (GAUZE/BANDAGES/DRESSINGS) ×2 IMPLANT
ELECT CAUTERY BLADE 6.4 (BLADE) ×2 IMPLANT
ELECT REM PT RETURN 9FT ADLT (ELECTROSURGICAL) ×2
ELECTRODE REM PT RTRN 9FT ADLT (ELECTROSURGICAL) ×1 IMPLANT
GAUZE SPONGE 4X4 12PLY STRL (GAUZE/BANDAGES/DRESSINGS) ×2 IMPLANT
GLOVE SURG SIGNA 7.5 PF LTX (GLOVE) ×2 IMPLANT
GOWN STRL REUS W/ TWL LRG LVL3 (GOWN DISPOSABLE) ×1 IMPLANT
GOWN STRL REUS W/ TWL XL LVL3 (GOWN DISPOSABLE) ×1 IMPLANT
GOWN STRL REUS W/TWL LRG LVL3 (GOWN DISPOSABLE) ×2
GOWN STRL REUS W/TWL XL LVL3 (GOWN DISPOSABLE) ×2
KIT BASIN OR (CUSTOM PROCEDURE TRAY) ×2 IMPLANT
KIT ROOM TURNOVER OR (KITS) ×2 IMPLANT
MESH VENTRALEX ST 2.5 CRC MED (Mesh General) ×2 IMPLANT
NEEDLE HYPO 25GX1X1/2 BEV (NEEDLE) ×2 IMPLANT
NS IRRIG 1000ML POUR BTL (IV SOLUTION) ×2 IMPLANT
PACK GENERAL/GYN (CUSTOM PROCEDURE TRAY) ×2 IMPLANT
PAD ARMBOARD 7.5X6 YLW CONV (MISCELLANEOUS) ×2 IMPLANT
STAPLER VISISTAT 35W (STAPLE) IMPLANT
SUT MNCRL AB 4-0 PS2 18 (SUTURE) ×2 IMPLANT
SUT NOVA NAB DX-16 0-1 5-0 T12 (SUTURE) IMPLANT
SUT PROLENE 1 CT (SUTURE) IMPLANT
SUT VIC AB 3-0 SH 27 (SUTURE) ×2
SUT VIC AB 3-0 SH 27XBRD (SUTURE) ×1 IMPLANT
SYR CONTROL 10ML LL (SYRINGE) ×2 IMPLANT
TOWEL OR 17X24 6PK STRL BLUE (TOWEL DISPOSABLE) ×2 IMPLANT
TOWEL OR 17X26 10 PK STRL BLUE (TOWEL DISPOSABLE) ×2 IMPLANT
TRAY FOLEY CATH 14FRSI W/METER (CATHETERS) IMPLANT

## 2016-10-15 NOTE — Op Note (Signed)
NAMEADALEYA, MCDANIELS NO.:  [id number]  MEDICAL RECORD NO.:  [id number]  LOCATION:  [facility]                         FACILITY:  [facility]  PHYSICIAN:  Coralie Keens, M.D. DATE OF BIRTH:  09-Jun-1951  DATE OF PROCEDURE:  10/15/2016 DATE OF DISCHARGE:                              OPERATIVE REPORT   PREOPERATIVE DIAGNOSIS:  Recurrent incisional hernia.  POSTOPERATIVE DIAGNOSIS:  Recurrent incisional hernia.  PROCEDURE:  Repair of incisional hernia with mesh.  SURGEON:  Coralie Keens, M.D.  ANESTHESIA:  General and 0.25% Marcaine.  ESTIMATED BLOOD LOSS:  Minimal.  INDICATIONS:  This is a 65 year old female who has had previous laparoscopic hernia repair with mesh.  She had two separate fascial defects.  The one against the pelvic bone has recurred; therefore, decision was made to proceed with open repair.  FINDINGS:  The patient was found to have fascial defect, which had contained herniated colon and bladder into the wound.  These were able to be separated and reduced.  The fascial defect was repaired with a 6- cm round ventral patch from [place].  PROCEDURE IN DETAIL:  The patient was brought to the operating room, identified as Lowella Dandy.  She was placed supine on the operating table.  General anesthesia was induced.  A Foley catheter was then inserted.  Her abdomen was then prepped and draped in usual sterile fashion.  I made an incision at the patient's lower abdomen through a previous scar just above the pubis.  I took this down to the fascia with electrocautery.  The patient had a fascial defect right at the pubis.  I was able to open this up and reduced all contents back into the abdominal cavity.  This included colon and bladder.  I was able to separate the scar tissue from the bladder and completely get it reduced. I then evaluated the fascia circumferentially.  The previous mesh had pulled away from the pubic bone.  The rest of the hernia repair  remained intact.  At this point, I brought a 6-cm round ventral patch onto the field.  I placed it down through the fascia opening and then pulled it against the perineum and fascia and pubis with the stay ties.  I then sewed the mesh in place circumferentially with interrupted #1 Novafil sutures.  I had to tack it to the pubic bone at the lateral edge.  At this point, I cut the stay ties and closed the fascia over the top of the mesh with figure-of-eight #1 Novafil sutures as well.  Wide coverage of the fascial defect appeared to be achieved.  At this point, I anesthetized the fascia and skin with Marcaine.  I then closed the subcutaneous tissue with interrupted 3-0 Vicryl sutures and closed the skin with a running 4-0 Monocryl.  Skin glue was then applied.  The patient tolerated the procedure well.  All the counts were correct at the end of procedure.  The patient was then extubated in the operating room and taken in a stable condition to the recovery room.     Coralie Keens, M.D.     DB/MEDQ  D:  10/15/2016  T:  10/15/2016  Job:  [id number] 

## 2016-10-15 NOTE — Anesthesia Procedure Notes (Signed)
Procedure Name: Intubation Date/Time: 10/15/2016 9:31 AM Performed by: Manuela Schwartz B Pre-anesthesia Checklist: Patient identified, Emergency Drugs available, Suction available, Patient being monitored and Timeout performed Patient Re-evaluated:Patient Re-evaluated prior to inductionOxygen Delivery Method: Circle system utilized Preoxygenation: Pre-oxygenation with 100% oxygen Intubation Type: IV induction Ventilation: Mask ventilation without difficulty Laryngoscope Size: Mac and 3 Grade View: Grade I Tube type: Oral Tube size: 7.5 mm Number of attempts: 1 Airway Equipment and Method: Stylet Placement Confirmation: ETT inserted through vocal cords under direct vision,  positive ETCO2 and breath sounds checked- equal and bilateral Secured at: 20 cm Tube secured with: Tape Dental Injury: Teeth and Oropharynx as per pre-operative assessment

## 2016-10-15 NOTE — Anesthesia Postprocedure Evaluation (Signed)
Anesthesia Post Note  Patient: [name]  Procedure(s) Performed: Procedure(s) (LRB): OPEN INCISIONAL HERNIA REPAIR WITH MESH (N/A)  Patient location during evaluation: PACU Anesthesia Type: General Level of consciousness: awake and alert, oriented and patient cooperative Pain management: pain level controlled Vital Signs Assessment: post-procedure vital signs reviewed and stable Respiratory status: spontaneous breathing, nonlabored ventilation and respiratory function stable Cardiovascular status: blood pressure returned to baseline and stable Postop Assessment: no signs of nausea or vomiting Anesthetic complications: no    Last Vitals:  Vitals:   10/15/16 1100 10/15/16 1119  BP: (!) 107/51 (!) 115/54  Pulse: 68 68  Resp: 14 18  Temp:  36.7 C    Last Pain:  Vitals:   10/15/16 1309  TempSrc:   PainSc: 2                  Keya Wynes,E. Lorijean Husser

## 2016-10-15 NOTE — Anesthesia Preprocedure Evaluation (Addendum)
Anesthesia Evaluation  Patient identified by MRN, date of birth, ID band Patient awake    Reviewed: Allergy & Precautions, NPO status , Patient's Chart, lab work & pertinent test results  History of Anesthesia Complications Negative for: history of anesthetic complications  Airway Mallampati: II  TM Distance: >3 FB Neck ROM: Full    Dental  (+) Missing, Dental Advisory Given, Chipped   Pulmonary neg pulmonary ROS,    breath sounds clear to auscultation       Cardiovascular hypertension, Pt. on medications (-) angina Rhythm:Regular Rate:Normal     Neuro/Psych  Headaches, Seizures - (last seizure 2001), Well Controlled,  Anxiety Depression    GI/Hepatic Neg liver ROS, GERD  Medicated and Controlled,  Endo/Other  negative endocrine ROS  Renal/GU negative Renal ROS     Musculoskeletal   Abdominal   Peds  Hematology negative hematology ROS (+)   Anesthesia Other Findings   Reproductive/Obstetrics                            Anesthesia Physical Anesthesia Plan  ASA: II  Anesthesia Plan: General   Post-op Pain Management:    Induction: Intravenous  Airway Management Planned: Oral ETT  Additional Equipment:   Intra-op Plan:   Post-operative Plan: Extubation in OR  Informed Consent: I have reviewed the patients History and Physical, chart, labs and discussed the procedure including the risks, benefits and alternatives for the proposed anesthesia with the patient or authorized representative who has indicated his/her understanding and acceptance.   Dental advisory given  Plan Discussed with: CRNA and Surgeon  Anesthesia Plan Comments: (Plan routine monitors, GETA)        Anesthesia Quick Evaluation

## 2016-10-15 NOTE — Transfer of Care (Signed)
Immediate Anesthesia Transfer of Care Note  Patient: NEVAYA ALESSANDRA  Procedure(s) Performed: Procedure(s): OPEN INCISIONAL HERNIA REPAIR WITH MESH (N/A)  Patient Location: PACU  Anesthesia Type:General  Level of Consciousness: responds to stimulation  Airway & Oxygen Therapy: Patient Spontanous Breathing  Post-op Assessment: Report given to RN and Post -op Vital signs reviewed and stable  Post vital signs: Reviewed and stable  Last Vitals:  Vitals:   10/15/16 1022 10/15/16 1100  BP:  (!) 107/51  Pulse: 71 67  Resp: 11 14  Temp: 36.6 C     Last Pain:  Vitals:   10/15/16 0851  TempSrc:   PainSc: 0-No pain      Patients Stated Pain Goal: 2 ([id number])  Complications: No apparent anesthesia complications

## 2016-10-15 NOTE — Op Note (Signed)
OPEN INCISIONAL HERNIA REPAIR WITH MESH  Procedure Note  [name] 10/15/2016   Pre-op Diagnosis: Recurrent incisional hernia     Post-op Diagnosis: same  Procedure(s): OPEN INCISIONAL HERNIA REPAIR WITH MESH (6 cm round ventral patch from [place])  Surgeon(s): Coralie Keens, MD  Anesthesia: General  Staff:  Circulator: Deland Pretty, RN Scrub Person: Jim Like Leggio  Estimated Blood Loss: Minimal                         Shukri Nistler A   Date: 10/15/2016  Time: 10:19 AM

## 2016-10-15 NOTE — Interval H&P Note (Signed)
History and Physical Interval Note:no change in H and P  10/15/2016 8:06 AM  [name]  has presented today for surgery, with the diagnosis of Recurrent incisional hernia  The various methods of treatment have been discussed with the patient and family. After consideration of risks, benefits and other options for treatment, the patient has consented to  Procedure(s): OPEN INCISIONAL HERNIA REPAIR WITH MESH (N/A) as a surgical intervention .  The patient's history has been reviewed, patient examined, no change in status, stable for surgery.  I have reviewed the patient's chart and labs.  Questions were answered to the patient's satisfaction.     Braxton Weisbecker A

## 2016-10-16 ENCOUNTER — Encounter (HOSPITAL_COMMUNITY): Payer: Self-pay | Admitting: Surgery

## 2016-10-16 DIAGNOSIS — K219 Gastro-esophageal reflux disease without esophagitis: Secondary | ICD-10-CM | POA: Diagnosis not present

## 2016-10-16 DIAGNOSIS — I10 Essential (primary) hypertension: Secondary | ICD-10-CM | POA: Diagnosis not present

## 2016-10-16 DIAGNOSIS — K432 Incisional hernia without obstruction or gangrene: Secondary | ICD-10-CM | POA: Diagnosis not present

## 2016-10-16 DIAGNOSIS — Z886 Allergy status to analgesic agent status: Secondary | ICD-10-CM | POA: Diagnosis not present

## 2016-10-16 DIAGNOSIS — F329 Major depressive disorder, single episode, unspecified: Secondary | ICD-10-CM | POA: Diagnosis not present

## 2016-10-16 DIAGNOSIS — F419 Anxiety disorder, unspecified: Secondary | ICD-10-CM | POA: Diagnosis not present

## 2016-10-16 MED ORDER — OXYCODONE HCL 5 MG PO TABS: 5.0000 mg | ORAL_TABLET | ORAL | 0 refills | Status: DC | PRN

## 2016-10-16 NOTE — Progress Notes (Signed)
Thea Alken to be D/C'd  per MD order. Discussed with the patient and all questions fully answered.  VSS, Skin clean, dry and intact without evidence of skin break down, no evidence of skin tears noted.  IV catheter discontinued intact. Site without signs and symptoms of complications. Dressing and pressure applied.  An After Visit Summary was printed and given to the patient. Patient received prescription.  D/c education completed with patient/family including follow up instructions, medication list, d/c activities limitations if indicated, with other d/c instructions as indicated by MD - patient able to verbalize understanding, all questions fully answered.   Patient instructed to return to ED, call 911, or call MD for any changes in condition.   Patient to be escorted via [place], and D/C home via private auto.

## 2016-10-16 NOTE — Discharge Summary (Signed)
Physician Discharge Summary  Patient ID: [name] MRN: [id number] DOB/AGE: 1951/04/27 65 y.o.  Admit date: 10/15/2016 Discharge date: 10/16/2016  Admission Diagnoses:  Discharge Diagnoses:  Active Problems:   Incisional hernia   Discharged Condition: good  Hospital Course: uneventful post op recovery.  Discharged home POD#1  Consults: None  Significant Diagnostic Studies:   Treatments: surgery: incisional hernia repair with mesh  Discharge Exam: Blood pressure (!) 122/54, pulse (!) 105, temperature 99.6 F (37.6 C), temperature source Oral, resp. rate 16, height 4\' 11"  (1.499 m), weight 51.7 kg (114 lb 0.4 oz), SpO2 100 %. General appearance: alert, cooperative and no distress Resp: clear to auscultation bilaterally Cardio: regular rate and rhythm, S1, S2 normal, no murmur, click, rub or gallop Incision/Wound:abdomen soft, incision clean  Disposition: 01-Home or Self Care     Medication List    TAKE these medications   acetaminophen 500 MG tablet Commonly known as:  TYLENOL Take 1,000 mg by mouth every 6 (six) hours as needed for mild pain.   amLODipine 5 MG tablet Commonly known as:  NORVASC Take 5 mg by mouth daily after breakfast.   atorvastatin 40 MG tablet Commonly known as:  LIPITOR Take 40 mg by mouth daily after breakfast.   BUTRANS 10 MCG/HR Ptwk patch Generic drug:  buprenorphine APPLY 1 PATCH ON SKIN,CHANGE PATCH WEEKLY AND ROTATE PLACEMENT SITES   CALCIUM 600 + D 600-200 MG-UNIT Tabs Generic drug:  Calcium Carb-Cholecalciferol Take 1 tablet by mouth daily.   divalproex 500 MG 24 hr tablet Commonly known as:  DEPAKOTE ER TAKE 1 TABLET BY MOUTH EVERY MORNING AND TAKE 2 TABLETS BY MOUTH EVERY EVENING   esomeprazole 40 MG capsule Commonly known as:  NEXIUM Take 40 mg by mouth daily after breakfast.   ferrous sulfate 325 (65 FE) MG tablet Take 325 mg by mouth daily with breakfast.   magnesium oxide 400 MG tablet Commonly known as:   MAG-OX Take 400 mg by mouth 2 (two) times daily.   multivitamin with minerals Tabs tablet Take 1 tablet by mouth daily.   olmesartan 40 MG tablet Commonly known as:  BENICAR Take 40 mg by mouth daily after breakfast.   ondansetron 4 MG tablet Commonly known as:  ZOFRAN Take 1 tablet (4 mg total) by mouth every 8 (eight) hours as needed for nausea or vomiting.   oxyCODONE 5 MG immediate release tablet Commonly known as:  Oxy IR/ROXICODONE Take 1-2 tablets (5-10 mg total) by mouth every 4 (four) hours as needed for moderate pain.   sertraline 25 MG tablet Commonly known as:  ZOLOFT Take 1 tablet (25 mg total) by mouth daily.   SUMAtriptan Succinate 11 MG/NOSEPC Exhp Commonly known as:  ONZETRA XSAIL Place 11 mg into the nose as needed. one per nostril at onset of migraine, may repeat dose after 2 hours do not exceed 2 doses in 24 hours   tiZANidine 2 MG tablet Commonly known as:  ZANAFLEX TAKE 1 TABLET BY MOUTH  TWICE A DAY   topiramate 100 MG tablet Commonly known as:  TOPAMAX Take 1 tablet (100 mg total) by mouth daily. What changed:  when to take this   TOVIAZ 8 MG Tb24 tablet Generic drug:  fesoterodine Take 8 mg by mouth at bedtime.   traMADol 50 MG tablet Commonly known as:  ULTRAM Take 50 mg by mouth 3 (three) times daily as needed for moderate pain (pain).      Follow-up Information    Jonasia Coiner A,  MD. Schedule an appointment as soon as possible for a visit in 3 week(s).   Specialty:  General Surgery Contact information: [address] [place] [address] [phone]           Signed: Harl Bowie 10/16/2016, 8:32 AM

## 2016-10-16 NOTE — Progress Notes (Signed)
Patient ID: [name], female   DOB: [birth year], 65 y.o.   MRN: [id number]   Doing well Foley out No yet voided Pain controlled Abdomen soft  Plan: Discharge if able to void

## 2016-10-16 NOTE — Discharge Instructions (Signed)
[facility] _______[facility]  UMBILICAL OR INGUINAL HERNIA REPAIR: POST OP INSTRUCTIONS  Always review your discharge instruction sheet given to you by the facility where your surgery was performed. IF YOU HAVE DISABILITY OR FAMILY LEAVE FORMS, YOU MUST BRING THEM TO THE OFFICE FOR PROCESSING.   DO NOT GIVE THEM TO YOUR DOCTOR.  1. A  prescription for pain medication may be given to you upon discharge.  Take your pain medication as prescribed, if needed.  If narcotic pain medicine is not needed, then you may take acetaminophen (Tylenol) or ibuprofen (Advil) as needed. 2. Take your usually prescribed medications unless otherwise directed. If you need a refill on your pain medication, please contact your pharmacy.  They will contact our office to request authorization. Prescriptions will not be filled after 5 pm or on week-ends. 3. You should follow a light diet the first 24 hours after arrival home, such as soup and crackers, etc.  Be sure to include lots of fluids daily.  Resume your normal diet the day after surgery. 4.Most patients will experience some swelling and bruising around the umbilicus or in the groin and scrotum.  Ice packs and reclining will help.  Swelling and bruising can take several days to resolve.  6. It is common to experience some constipation if taking pain medication after surgery.  Increasing fluid intake and taking a stool softener (such as Colace) will usually help or prevent this problem from occurring.  A mild laxative (Milk of Magnesia or Miralax) should be taken according to package directions if there are no bowel movements after 48 hours. 7. Unless discharge instructions indicate otherwise, you may remove your bandages 24-48 hours after surgery, and you may shower at that time.  You may have steri-strips (small skin tapes) in place directly over the incision.  These strips should be left on the skin for 7-10 days.  If your surgeon used skin glue on the  incision, you may shower in 24 hours.  The glue will flake off over the next 2-3 weeks.  Any sutures or staples will be removed at the office during your follow-up visit. 8. ACTIVITIES:  You may resume regular (light) daily activities beginning the next day--such as daily self-care, walking, climbing stairs--gradually increasing activities as tolerated.  You may have sexual intercourse when it is comfortable.  Refrain from any heavy lifting or straining until approved by your doctor.  a.You may drive when you are no longer taking prescription pain medication, you can comfortably wear a seatbelt, and you can safely maneuver your car and apply brakes. b.RETURN TO WORK:   _____________________________________________  9.You should see your doctor in the office for a follow-up appointment approximately 2-3 weeks after your surgery.  Make sure that you call for this appointment within a day or two after you arrive home to insure a convenient appointment time. 10.OTHER INSTRUCTIONS: _OK TO SHOWER NO LIFTING MORE THAN 15 POUNDS FOR 6 WEEKS ICE PACK ALSO FOR PAIN________________________    _____________________________________  WHEN TO CALL YOUR DOCTOR: 1. Fever over 101.0 2. Inability to urinate 3. Nausea and/or vomiting 4. Extreme swelling or bruising 5. Continued bleeding from incision. 6. Increased pain, redness, or drainage from the incision  The clinic staff is available to answer your questions during regular business hours.  Please dont hesitate to call and ask to speak to one of the nurses for clinical concerns.  If you have a medical emergency, go to the nearest emergency room or call 911.  A surgeon from [facility] is always on call at the hospital   [address] [place], [place], [place]  [address] ?  [address], [place], [place]   [address] [phone] ? [phone] ? FAX [phone] Web site: [email or web address]

## 2016-10-17 DIAGNOSIS — S0991XA Unspecified injury of ear, initial encounter: Secondary | ICD-10-CM | POA: Diagnosis not present

## 2016-10-23 ENCOUNTER — Encounter: Payer: Self-pay | Admitting: Nurse Practitioner

## 2016-10-23 ENCOUNTER — Ambulatory Visit (INDEPENDENT_AMBULATORY_CARE_PROVIDER_SITE_OTHER): Payer: Medicare Other | Admitting: Nurse Practitioner

## 2016-10-23 VITALS — BP 145/73 | HR 91 | Resp 20 | Ht <= 58 in | Wt 115.0 lb

## 2016-10-23 DIAGNOSIS — R350 Frequency of micturition: Secondary | ICD-10-CM | POA: Diagnosis not present

## 2016-10-23 DIAGNOSIS — R51 Headache: Secondary | ICD-10-CM

## 2016-10-23 DIAGNOSIS — D329 Benign neoplasm of meninges, unspecified: Secondary | ICD-10-CM | POA: Diagnosis not present

## 2016-10-23 DIAGNOSIS — G8929 Other chronic pain: Secondary | ICD-10-CM

## 2016-10-23 DIAGNOSIS — N3941 Urge incontinence: Secondary | ICD-10-CM | POA: Diagnosis not present

## 2016-10-23 MED ORDER — TIZANIDINE HCL 2 MG PO TABS: ORAL_TABLET | ORAL | 1 refills | Status: DC

## 2016-10-23 MED ORDER — SUMATRIPTAN SUCCINATE 11 MG/NOSEPC NA EXHP: 11.0000 mg | INHALANT_POWDER | NASAL | 6 refills | Status: DC | PRN

## 2016-10-23 NOTE — Patient Instructions (Signed)
Continue  medications for migraine, Depakote, Topamax and tizanidine Onzetra acutely will refill Will refill Tizanidine will refill Follow up in 6 months next with Dr. [name]

## 2016-10-23 NOTE — Progress Notes (Signed)
[facility]  PATIENT: [name] DOB: 08-31-1951   REASON FOR VISIT: Follow-up for intractable headache, history of depression meningioma HISTORY FROM: Patient    HISTORY OF PRESENT ILLNESS: HISTORY: Right-handed African American married female with daily left hemicranial headaches lasting 30 minutes to 1-1/2 hours usually occurring 5 or 6 times per day, previous patient of Dr. Erling Cruz.  Her headaches are always on the left side. She also has a history of complex partial Seizures, last seizure was 2007.  She had an MRI studies of her brain 05/2009, 09/15/09, and 07/02/12 showing stable en plaque meningiomas, one over the left lateral convexity and the other in the middle of the anterior fossa.  There is improvement in the frequency and severity of her headaches with the use of Indomethacin . It cuts down her headaches approximately 75%. Indomethacin is associated with elevated creatinine. She is followed by Dr.Webb. Her creatinine runs 1.19. Because of this she discontinued her indomethacin .  She tried Botox by Dr. Tyron Russell and [place] without benefit . Nasal oxygen increases the speed with which her headache pain relief occurs. She developed headaches in February 2012 and went to the emergency room. She went to the emergency room 10/19/213 with headaches. She continued to have headaches after her ER visit. They are worse at night than during the day. They are not associated with eyelid droop, nasal stuffiness, or tearing. She has gained weight on divalproex sodium.  Update 07/14/2015: [name], 65 year old female returns for follow-up. She continues to have frequent headaches almost on a daily basis and is on polypharmacy to include Depakote Topamax tizanidine magnesium oxide verapamil and Relpax. She woke up with a headache this morning however she did not take her Relpax. Her husband reports that she snores at night. She complains of significant daytime drowsiness and  scores 15 on ESS. She also complains of frequent awakening at night. She is wanting to decrease her Topamax as she has not had a seizure in many years. With the frequency of her headaches I'm not sure her medications are working for her anyway. She returns for reevaluation UPDATE November 08 2015: [name] 65 year old female returns for follow-up. She has a history of intractable headaches always occurring on the left side. She was originally evaluated at this office in 2001 by Dr. Erling Cruz . She has been tried on multiple medication regimens including Botox in the past see below. She continues to have 5-6 headaches per week. Her headaches might last 1-2 hours then reoccur during the day. She had a sleep study after my last visit with her which was negative for obstructive sleep apnea She went to the emergency room November 7 and received a migraine cocktail with relief. She had a headache the next day and took hydrocodone. She claims Relpax no longer works for her acutely. She is currently on Topamax, Depakote, verapamil and tizanidine as preventatives. She has also been on Reglan for many years and I noticed some abnormal movements of her face today. She apparently had a headache yesterday and took Tylenol Extra Strength the headache went away but she woke up with a headache this morning. Her headache today is rated between 8 and 9 on the pain scale .She did not take Relpax instead took tizanidine with no relief. On questioning her about her Tylenol she takes it on a daily basis 2 to 4 tabs she is probably getting some rebound effect. She has chronic renal disease most recent creatinine 1.21  Her review of medications that have been tried in the past include the following.  Analgesic, Tylenol Percocet Demerol Stadol Tylenol 3, Tylenol No. 4, Ultram Dilaudid and Norco Acute meds Amerge, D.H.E. Imitrex spray Maxalt Migranal Sansert, Zomig and Relpax Antihypertensives atenolol, Calan, currently on  verapamil Antinausea Phenergan Reglan Zofran Anti-inflammatory Indocin Celebrex diclofenac Vioxx Muscle relaxants baclofen Robaxin Flexeril currently on Zanaflex : convulsants Lamictal gabapentin Lyrica Keppra and Zonegran, currently on Depakote Topamax and Horizant Steroids prednisone Tranquilizers/ sleepers Thorazine Zyprexa and Vistaril Antidepressant Elavil lithium Paxil Prozac and imipramine  Other currently on magnesium  She returns for reevaluation  UPDATE Jan 09 2016:YY She is with her husband at today's clinical visit, she reported that her headache overall is under better control, she was given SUMAtriptan Succinate (ONZETRA XSAIL) 11 MG/nasal spray, which has helped her symptoms,  she is having headaches 2-3 times each week, always on the left side, she also noticed mild right leg weakness, mild unsteady gait, Today we have personally reviewed MRI of the brain with without contrast in December 2016 Slow growth of 2 meningiomas since study of 2010. Left planum sphenoidale meningioma has increased from 5 x 7 mm to 11 x 12 mm. Left frontoparietal convexity meningioma has increased from 14 x 5 mm to 14 x 8 mm. She had a history of seizure in the past, last seizure was more than decade ago, but patient reported that her seizure tends to proceed with right arm and leg shaking UPDATE 04/27/2017CM [name], 64 year old female returns for follow-up. She has a history of migraine headaches and has tried multiple medications in the past. When I last saw her in November she was placed on Onzetra that has been beneficial for her acutely. She says her headaches are in good control at present. She was last seen by Dr. [name] in January MRI of the brain with contrast was reviewed and due to the fact that her meningioma had doubled in size she was referred to neurosurgeon [facility]. She continues her migraine preventatives of Depakote Topamax and tizanidine. She returns for reevaluation  UPDATE  10/31/2017CM [name], 65 year old female returns for follow-up. She has a history of headaches with multiple medications in the past. See above for listing. She had hernia surgery on the 23rd of this month. She was in the hospital for 2 days. She is currently not driving nor able to lift. She is on narcotic medications. Her headaches are more worse. She is currently on Depakote Topamax and tizanidine and no acute medication Onzetra. She is going to pain management clinic in [place]. She returns for reevaluation  REVIEW OF SYSTEMS: Full 14 system review of systems performed and notable only for those listed, all others are neg:  Constitutional: Recent hernia surgery  Cardiovascular: neg Ear/Nose/Throat: neg  Skin: neg Eyes: neg Respiratory: neg Gastroitestinal: neg  Hematology/Lymphatic: neg  Endocrine: neg Musculoskeletal:neg Allergy/Immunology: neg Neurological: History of migraines  Psychiatric: neg Sleep : neg   ALLERGIES: Allergies  Allergen Reactions  . Nsaids Other (See Comments)    Can cause ulcers  . Aspirin Nausea Only and Other (See Comments)    Could cause ulcers   . Tolmetin Other (See Comments)    Can cause ulcers    HOME MEDICATIONS: Outpatient Medications Prior to Visit  Medication Sig Dispense Refill  . acetaminophen (TYLENOL) 500 MG tablet Take 1,000 mg by mouth every 6 (six) hours as needed for mild pain.    Marland Kitchen amLODipine (NORVASC) 5 MG tablet Take 5  mg by mouth daily after breakfast.     . atorvastatin (LIPITOR) 40 MG tablet Take 40 mg by mouth daily after breakfast.     . BUTRANS 10 MCG/HR PTWK patch APPLY 1 PATCH ON SKIN,CHANGE PATCH WEEKLY AND ROTATE PLACEMENT SITES  5  . Calcium Carb-Cholecalciferol (CALCIUM 600 + D) 600-200 MG-UNIT TABS Take 1 tablet by mouth daily.    . divalproex (DEPAKOTE ER) 500 MG 24 hr tablet TAKE 1 TABLET BY MOUTH EVERY MORNING AND TAKE 2 TABLETS BY MOUTH EVERY EVENING 270 tablet 3  . esomeprazole (NEXIUM) 40 MG capsule  Take 40 mg by mouth daily after breakfast.     . ferrous sulfate 325 (65 FE) MG tablet Take 325 mg by mouth daily with breakfast.    . magnesium oxide (MAG-OX) 400 MG tablet Take 400 mg by mouth 2 (two) times daily.     . Multiple Vitamin (MULTIVITAMIN WITH MINERALS) TABS Take 1 tablet by mouth daily.    Marland Kitchen olmesartan (BENICAR) 40 MG tablet Take 40 mg by mouth daily after breakfast.     . ondansetron (ZOFRAN) 4 MG tablet Take 1 tablet (4 mg total) by mouth every 8 (eight) hours as needed for nausea or vomiting. 20 tablet 2  . oxyCODONE (OXY IR/ROXICODONE) 5 MG immediate release tablet Take 1-2 tablets (5-10 mg total) by mouth every 4 (four) hours as needed for moderate pain. 40 tablet 0  . sertraline (ZOLOFT) 25 MG tablet Take 1 tablet (25 mg total) by mouth daily. 30 tablet 3  . SUMAtriptan Succinate (ONZETRA XSAIL) 11 MG/NOSEPC EXHP Place 11 mg into the nose as needed. one per nostril at onset of migraine, may repeat dose after 2 hours do not exceed 2 doses in 24 hours 2 each 3  . tiZANidine (ZANAFLEX) 2 MG tablet TAKE 1 TABLET BY MOUTH  TWICE A DAY 180 tablet 1  . topiramate (TOPAMAX) 100 MG tablet Take 1 tablet (100 mg total) by mouth daily. (Patient taking differently: Take 100 mg by mouth at bedtime. ) 90 tablet 3  . TOVIAZ 8 MG TB24 tablet Take 8 mg by mouth at bedtime.   11  . traMADol (ULTRAM) 50 MG tablet Take 50 mg by mouth 3 (three) times daily as needed for moderate pain (pain).   4   No facility-administered medications prior to visit.     PAST MEDICAL HISTORY: Past Medical History:  Diagnosis Date  . Depression   . Epilepsy, grand mal ([place])    "last seizure ~ 2001" (06/16/2015)  . GERD (gastroesophageal reflux disease)   . High cholesterol   . Hypertension   . Migraine    "just about qd";  treated by Dr. [name] (10/15/2016)  . Pre-diabetes    she reports that she never took meds. for prediabetes, states MD has told her that she has corrected her situation with diet   .  Seizures ([place])    "at one time; stopped in the 1990s" (10/15/2016)  . Stress at home    Pt. reports that she has a lot of personal stress & she is aware that it might being playing a part in her Migraine heaaches      PAST SURGICAL HISTORY: Past Surgical History:  Procedure Laterality Date  . HERNIA REPAIR    . INCISIONAL HERNIA REPAIR N/A 06/16/2015   Procedure: LAPAROSCOPIC INCISIONAL HERNIA WITH MESH;  Surgeon: Coralie Keens, MD;  Location: [place];  Service: General;  Laterality: N/A;  . [place]  10/15/2016  . INCISIONAL HERNIA REPAIR N/A 10/15/2016   Procedure: OPEN INCISIONAL HERNIA REPAIR WITH MESH;  Surgeon: Coralie Keens, MD;  Location: [place];  Service: General;  Laterality: N/A;  . INSERTION OF MESH N/A 06/16/2015   Procedure: INSERTION OF MESH;  Surgeon: Coralie Keens, MD;  Location: [facility];  Service: General;  Laterality: N/A;  . VAGINAL HYSTERECTOMY  ~ 1992    FAMILY HISTORY: Family History  Problem Relation Age of Onset  . Lung cancer Mother   . Kidney failure Father   . Hypertension Other     SOCIAL HISTORY: Social History   Social History  . Marital status: Married    Spouse name: Elenore Rota  . Number of children: 3  . Years of education: college   Occupational History  .      does not work   Social History Main Topics  . Smoking status: Never Smoker  . Smokeless tobacco: Never Used  . Alcohol use No  . Drug use: No  . Sexual activity: Yes   Other Topics Concern  . Not on file   Social History Narrative   Patient lives at home with her husband Elenore Rota). Patient was a homemaker.   Right handed.   College education.   Caffeine- One cup of coffee daily.   Patient has three children.     PHYSICAL EXAM  Vitals:   10/23/16 1056  BP: (!) 145/73  Pulse: 91  Resp: 20  Weight: 115 lb (52.2 kg)  Height: 4\' 9"  (1.448 m)   Body mass index is 24.89 kg/m.  Generalized: Well developed, in no acute distress  Head: normocephalic  and atraumatic,. Oropharynx benign  Neck: Supple, no carotid bruits  Cardiac: Regular rate rhythm, no murmur  Musculoskeletal: No deformity   Neurological examination   Mentation: Alert oriented to time, place, history taking. Attention span and concentration appropriate. Recent and remote memory intact.  Follows all commands speech and language fluent.   Cranial nerve II-XII: Fundoscopic exam reveals sharp disc margins.Pupils were equal round reactive to light extraocular movements were full, visual field were full on confrontational test. Facial sensation and strength were normal. hearing was intact to finger rubbing bilaterally. Uvula tongue midline. head turning and shoulder shrug were normal and symmetric.Tongue protrusion into cheek strength was normal. Motor: normal bulk and tone, full strength in the BUE, BLE, fine finger movements normal, no pronator drift. No focal weakness Sensory: normal and symmetric to light touch, pinprick, and  Vibration, in the fingers and toes  Coordination: finger-nose-finger, heel-to-shin bilaterally, no dysmetria Reflexes: Brachioradialis 2/2, biceps 2/2, triceps 2/2, patellar 2/2, Achilles 2/2, plantar responses were flexor bilaterally. Gait and Station: Rising up from seated position without assistance, normal stance,  moderate stride, good arm swing, smooth turning, able to perform tiptoe, and heel walking without difficulty. Tandem gait is steady. No assistive device  DIAGNOSTIC DATA (LABS, IMAGING, TESTING) - I reviewed patient records, labs, notes, testing and imaging myself where available.  Lab Results  Component Value Date   WBC 8.9 10/11/2016   HGB 12.5 10/11/2016   HCT 38.2 10/11/2016   MCV 93.6 10/11/2016   PLT 265 10/11/2016      Component Value Date/Time   NA 139 10/11/2016 1447   K 3.7 10/11/2016 1447   CL 106 10/11/2016 1447   CO2 27 10/11/2016 1447   GLUCOSE 94 10/11/2016 1447   BUN 14 10/11/2016 1447   CREATININE 0.93  10/11/2016 1447   CALCIUM 9.9 10/11/2016 1447  PROT 6.0 (L) 01/21/2016 2109   ALBUMIN 3.1 (L) 01/21/2016 2109   AST 25 01/21/2016 2109   ALT 15 01/21/2016 2109   ALKPHOS 54 01/21/2016 2109   BILITOT 0.3 01/21/2016 2109   GFRNONAA >60 10/11/2016 1447   GFRAA >60 10/11/2016 1447     ASSESSMENT AND PLAN  65 y.o. year old female  has a past medical history of High cholesterol; Hypertension; Type II diabetes mellitus ([place]) (dx'd ~ 2014); ; Migraine; and Epilepsy, grand mal ([place]). And left frontal and parietal meningioma which has doubled in size in the past few years she was sent for second opinion by Dr. [name].She is going to a pain management Center in [place]  PLAN:Continue  medications for migraine, Depakote, Topamax and tizanidine Onzetra acutely will refill Will refill Tizanidine  Follow up in 6 months next with Dr. Luan Pulling, [facility], [facility], [facility] [address], [address] [place], [place] [address] [phone]

## 2016-10-24 NOTE — Progress Notes (Signed)
I have reviewed and agreed above plan. 

## 2016-10-29 DIAGNOSIS — R51 Headache: Secondary | ICD-10-CM | POA: Diagnosis not present

## 2016-10-29 DIAGNOSIS — E1122 Type 2 diabetes mellitus with diabetic chronic kidney disease: Secondary | ICD-10-CM | POA: Diagnosis not present

## 2016-10-29 DIAGNOSIS — N189 Chronic kidney disease, unspecified: Secondary | ICD-10-CM | POA: Diagnosis not present

## 2016-10-29 DIAGNOSIS — Z86011 Personal history of benign neoplasm of the brain: Secondary | ICD-10-CM | POA: Diagnosis not present

## 2016-10-29 DIAGNOSIS — I129 Hypertensive chronic kidney disease with stage 1 through stage 4 chronic kidney disease, or unspecified chronic kidney disease: Secondary | ICD-10-CM | POA: Diagnosis not present

## 2016-10-30 DIAGNOSIS — R350 Frequency of micturition: Secondary | ICD-10-CM | POA: Diagnosis not present

## 2016-10-30 DIAGNOSIS — N3941 Urge incontinence: Secondary | ICD-10-CM | POA: Diagnosis not present

## 2016-11-06 DIAGNOSIS — N3941 Urge incontinence: Secondary | ICD-10-CM | POA: Diagnosis not present

## 2016-11-06 DIAGNOSIS — R350 Frequency of micturition: Secondary | ICD-10-CM | POA: Diagnosis not present

## 2016-11-12 DIAGNOSIS — G43009 Migraine without aura, not intractable, without status migrainosus: Secondary | ICD-10-CM | POA: Diagnosis not present

## 2016-11-20 DIAGNOSIS — N3941 Urge incontinence: Secondary | ICD-10-CM | POA: Diagnosis not present

## 2016-11-20 DIAGNOSIS — R350 Frequency of micturition: Secondary | ICD-10-CM | POA: Diagnosis not present

## 2016-11-27 DIAGNOSIS — R350 Frequency of micturition: Secondary | ICD-10-CM | POA: Diagnosis not present

## 2016-11-29 DIAGNOSIS — R51 Headache: Secondary | ICD-10-CM | POA: Diagnosis not present

## 2016-11-29 DIAGNOSIS — G43009 Migraine without aura, not intractable, without status migrainosus: Secondary | ICD-10-CM | POA: Diagnosis not present

## 2016-11-29 DIAGNOSIS — Z79891 Long term (current) use of opiate analgesic: Secondary | ICD-10-CM | POA: Diagnosis not present

## 2016-11-29 DIAGNOSIS — Z79899 Other long term (current) drug therapy: Secondary | ICD-10-CM | POA: Diagnosis not present

## 2016-12-04 DIAGNOSIS — R350 Frequency of micturition: Secondary | ICD-10-CM | POA: Diagnosis not present

## 2016-12-04 DIAGNOSIS — N3941 Urge incontinence: Secondary | ICD-10-CM | POA: Diagnosis not present

## 2016-12-05 ENCOUNTER — Ambulatory Visit: Payer: Medicare Other | Admitting: Podiatry

## 2016-12-07 ENCOUNTER — Ambulatory Visit: Payer: Medicare Other | Admitting: Podiatry

## 2016-12-10 ENCOUNTER — Ambulatory Visit (INDEPENDENT_AMBULATORY_CARE_PROVIDER_SITE_OTHER): Payer: Medicare Other | Admitting: Podiatry

## 2016-12-10 ENCOUNTER — Encounter: Payer: Self-pay | Admitting: Podiatry

## 2016-12-10 DIAGNOSIS — M2042 Other hammer toe(s) (acquired), left foot: Secondary | ICD-10-CM | POA: Diagnosis not present

## 2016-12-10 DIAGNOSIS — M79672 Pain in left foot: Secondary | ICD-10-CM

## 2016-12-10 DIAGNOSIS — M79671 Pain in right foot: Secondary | ICD-10-CM

## 2016-12-10 DIAGNOSIS — M2041 Other hammer toe(s) (acquired), right foot: Secondary | ICD-10-CM | POA: Diagnosis not present

## 2016-12-10 DIAGNOSIS — L851 Acquired keratosis [keratoderma] palmaris et plantaris: Secondary | ICD-10-CM

## 2016-12-10 DIAGNOSIS — L84 Corns and callosities: Secondary | ICD-10-CM

## 2016-12-10 NOTE — Progress Notes (Signed)
Subjective: Patient presents to the office today for a new chief complaint of painful callus lesions of the feet. Patient states that the pain is ongoing and is affecting their ability to ambulate without pain. Patient presents today for further treatment and evaluation.  Objective:  Physical Exam General: Alert and oriented x3 in no acute distress  Dermatology: Hyperkeratotic lesion present on the weightbearing surfaces of the bilateral great toes. Pain on palpation with a central nucleated core noted.  Skin is warm, dry and supple bilateral lower extremities. Negative for open lesions or macerations.  Vascular: Palpable pedal pulses bilaterally. No edema or erythema noted. Capillary refill within normal limits.  Neurological: Epicritic and protective threshold grossly intact bilaterally.   Musculoskeletal Exam: Pain on palpation at the keratotic lesion noted. Range of motion within normal limits bilateral. Muscle strength 5/5 in all groups bilateral.  Pain on palpation is also noted to the PIPJ of the bilateral fifth digits. Proximal phalanx appears to be prominent with pain in shoe gear.  Assessment: #1 painful callus lesions bilateral great toes #2 pain in bilateral great toes #3 hammertoe deformity digit #5 bilateral - painful   Plan of Care:  #1 Patient evaluated #2 Excisional debridement of  keratoic lesion using a chisel blade was performed without incident.  #3 Treated area(s) with Salinocaine and dressed with light dressing. #4 silicone toe caps dispensed for fifth digit alleviation of hammertoe  #5 Patient is to return to the clinic PRN.   Edrick Kins, [place]

## 2016-12-11 DIAGNOSIS — N3941 Urge incontinence: Secondary | ICD-10-CM | POA: Diagnosis not present

## 2016-12-18 DIAGNOSIS — N3941 Urge incontinence: Secondary | ICD-10-CM | POA: Diagnosis not present

## 2016-12-25 DIAGNOSIS — N3941 Urge incontinence: Secondary | ICD-10-CM | POA: Diagnosis not present

## 2016-12-25 DIAGNOSIS — R350 Frequency of micturition: Secondary | ICD-10-CM | POA: Diagnosis not present

## 2016-12-27 DIAGNOSIS — F419 Anxiety disorder, unspecified: Secondary | ICD-10-CM | POA: Diagnosis not present

## 2016-12-27 DIAGNOSIS — D329 Benign neoplasm of meninges, unspecified: Secondary | ICD-10-CM | POA: Diagnosis not present

## 2016-12-27 DIAGNOSIS — F321 Major depressive disorder, single episode, moderate: Secondary | ICD-10-CM | POA: Diagnosis not present

## 2016-12-27 DIAGNOSIS — G3184 Mild cognitive impairment, so stated: Secondary | ICD-10-CM | POA: Diagnosis not present

## 2017-01-01 DIAGNOSIS — N3941 Urge incontinence: Secondary | ICD-10-CM | POA: Diagnosis not present

## 2017-01-01 DIAGNOSIS — R350 Frequency of micturition: Secondary | ICD-10-CM | POA: Diagnosis not present

## 2017-01-03 DIAGNOSIS — M7541 Impingement syndrome of right shoulder: Secondary | ICD-10-CM | POA: Diagnosis not present

## 2017-01-03 DIAGNOSIS — M7581 Other shoulder lesions, right shoulder: Secondary | ICD-10-CM | POA: Diagnosis not present

## 2017-01-03 DIAGNOSIS — M25511 Pain in right shoulder: Secondary | ICD-10-CM | POA: Diagnosis not present

## 2017-01-03 DIAGNOSIS — R112 Nausea with vomiting, unspecified: Secondary | ICD-10-CM | POA: Diagnosis not present

## 2017-01-04 ENCOUNTER — Other Ambulatory Visit: Payer: Self-pay | Admitting: Family Medicine

## 2017-01-04 ENCOUNTER — Ambulatory Visit
Admission: RE | Admit: 2017-01-04 | Discharge: 2017-01-04 | Disposition: A | Discharge status: Home / Self Care | Payer: Medicare Other | Source: Ambulatory Visit | Attending: Family Medicine | Admitting: Family Medicine

## 2017-01-04 DIAGNOSIS — M7541 Impingement syndrome of right shoulder: Secondary | ICD-10-CM

## 2017-01-04 DIAGNOSIS — M7581 Other shoulder lesions, right shoulder: Principal | ICD-10-CM

## 2017-01-04 DIAGNOSIS — M19011 Primary osteoarthritis, right shoulder: Secondary | ICD-10-CM | POA: Diagnosis not present

## 2017-01-04 DIAGNOSIS — M25511 Pain in right shoulder: Secondary | ICD-10-CM

## 2017-01-04 DIAGNOSIS — M778 Other enthesopathies, not elsewhere classified: Secondary | ICD-10-CM

## 2017-01-08 DIAGNOSIS — N3941 Urge incontinence: Secondary | ICD-10-CM | POA: Diagnosis not present

## 2017-01-08 DIAGNOSIS — R350 Frequency of micturition: Secondary | ICD-10-CM | POA: Diagnosis not present

## 2017-01-14 DIAGNOSIS — G43009 Migraine without aura, not intractable, without status migrainosus: Secondary | ICD-10-CM | POA: Diagnosis not present

## 2017-01-15 DIAGNOSIS — N3941 Urge incontinence: Secondary | ICD-10-CM | POA: Diagnosis not present

## 2017-01-15 DIAGNOSIS — R350 Frequency of micturition: Secondary | ICD-10-CM | POA: Diagnosis not present

## 2017-01-21 DIAGNOSIS — R112 Nausea with vomiting, unspecified: Secondary | ICD-10-CM | POA: Diagnosis not present

## 2017-01-21 DIAGNOSIS — K219 Gastro-esophageal reflux disease without esophagitis: Secondary | ICD-10-CM | POA: Diagnosis not present

## 2017-01-22 DIAGNOSIS — K3189 Other diseases of stomach and duodenum: Secondary | ICD-10-CM | POA: Diagnosis not present

## 2017-01-22 DIAGNOSIS — K449 Diaphragmatic hernia without obstruction or gangrene: Secondary | ICD-10-CM | POA: Diagnosis not present

## 2017-01-22 DIAGNOSIS — R112 Nausea with vomiting, unspecified: Secondary | ICD-10-CM | POA: Diagnosis not present

## 2017-01-22 DIAGNOSIS — K219 Gastro-esophageal reflux disease without esophagitis: Secondary | ICD-10-CM | POA: Diagnosis not present

## 2017-01-25 DIAGNOSIS — K3189 Other diseases of stomach and duodenum: Secondary | ICD-10-CM | POA: Diagnosis not present

## 2017-01-29 DIAGNOSIS — M25511 Pain in right shoulder: Secondary | ICD-10-CM | POA: Diagnosis not present

## 2017-01-29 DIAGNOSIS — R293 Abnormal posture: Secondary | ICD-10-CM | POA: Diagnosis not present

## 2017-01-29 DIAGNOSIS — M542 Cervicalgia: Secondary | ICD-10-CM | POA: Diagnosis not present

## 2017-01-29 DIAGNOSIS — M7541 Impingement syndrome of right shoulder: Secondary | ICD-10-CM | POA: Diagnosis not present

## 2017-01-30 DIAGNOSIS — M542 Cervicalgia: Secondary | ICD-10-CM | POA: Diagnosis not present

## 2017-01-30 DIAGNOSIS — M25511 Pain in right shoulder: Secondary | ICD-10-CM | POA: Diagnosis not present

## 2017-01-30 DIAGNOSIS — R293 Abnormal posture: Secondary | ICD-10-CM | POA: Diagnosis not present

## 2017-01-30 DIAGNOSIS — M7541 Impingement syndrome of right shoulder: Secondary | ICD-10-CM | POA: Diagnosis not present

## 2017-01-30 DIAGNOSIS — R112 Nausea with vomiting, unspecified: Secondary | ICD-10-CM | POA: Diagnosis not present

## 2017-01-30 DIAGNOSIS — M7581 Other shoulder lesions, right shoulder: Secondary | ICD-10-CM | POA: Diagnosis not present

## 2017-02-10 ENCOUNTER — Encounter (HOSPITAL_COMMUNITY): Payer: Self-pay

## 2017-02-10 ENCOUNTER — Emergency Department (HOSPITAL_COMMUNITY): Payer: Medicare Other

## 2017-02-10 ENCOUNTER — Emergency Department (HOSPITAL_COMMUNITY)
Admission: EM | Admit: 2017-02-10 | Discharge: 2017-02-11 | Disposition: A | Discharge status: Home / Self Care | Payer: Medicare Other | Attending: Emergency Medicine | Admitting: Emergency Medicine

## 2017-02-10 DIAGNOSIS — I10 Essential (primary) hypertension: Secondary | ICD-10-CM | POA: Insufficient documentation

## 2017-02-10 DIAGNOSIS — K529 Noninfective gastroenteritis and colitis, unspecified: Secondary | ICD-10-CM | POA: Diagnosis not present

## 2017-02-10 DIAGNOSIS — R1030 Lower abdominal pain, unspecified: Secondary | ICD-10-CM | POA: Diagnosis not present

## 2017-02-10 DIAGNOSIS — R112 Nausea with vomiting, unspecified: Secondary | ICD-10-CM | POA: Diagnosis present

## 2017-02-10 LAB — URINALYSIS, ROUTINE W REFLEX MICROSCOPIC
Bilirubin Urine: NEGATIVE
Glucose, UA: NEGATIVE mg/dL
Hgb urine dipstick: NEGATIVE
Ketones, ur: NEGATIVE mg/dL
Leukocytes, UA: NEGATIVE
Nitrite: NEGATIVE
Protein, ur: NEGATIVE mg/dL
Specific Gravity, Urine: 1.017 (ref 1.005–1.030)
pH: 7 (ref 5.0–8.0)

## 2017-02-10 LAB — CBC
HCT: 41.6 % (ref 36.0–46.0)
Hemoglobin: 13.4 g/dL (ref 12.0–15.0)
MCH: 30.8 pg (ref 26.0–34.0)
MCHC: 32.2 g/dL (ref 30.0–36.0)
MCV: 95.6 fL (ref 78.0–100.0)
Platelets: 240 10*3/uL (ref 150–400)
RBC: 4.35 MIL/uL (ref 3.87–5.11)
RDW: 13.2 % (ref 11.5–15.5)
WBC: 15.2 10*3/uL — ABNORMAL HIGH (ref 4.0–10.5)

## 2017-02-10 LAB — COMPREHENSIVE METABOLIC PANEL
ALT: 12 U/L — ABNORMAL LOW (ref 14–54)
AST: 24 U/L (ref 15–41)
Albumin: 4.1 g/dL (ref 3.5–5.0)
Alkaline Phosphatase: 60 U/L (ref 38–126)
Anion gap: 12 (ref 5–15)
BUN: 18 mg/dL (ref 6–20)
CO2: 25 mmol/L (ref 22–32)
Calcium: 9.2 mg/dL (ref 8.9–10.3)
Chloride: 97 mmol/L — ABNORMAL LOW (ref 101–111)
Creatinine, Ser: 1 mg/dL (ref 0.44–1.00)
GFR calc Af Amer: >60 mL/min (ref >60)
GFR calc non Af Amer: 57 mL/min — ABNORMAL LOW (ref >60)
Glucose, Bld: 124 mg/dL — ABNORMAL HIGH (ref 65–99)
Potassium: 4.3 mmol/L (ref 3.5–5.1)
Sodium: 134 mmol/L — ABNORMAL LOW (ref 135–145)
Total Bilirubin: 0.6 mg/dL (ref 0.3–1.2)
Total Protein: 7.2 g/dL (ref 6.5–8.1)

## 2017-02-10 LAB — LIPASE, BLOOD: Lipase: 24 U/L (ref 11–51)

## 2017-02-10 MED ORDER — ONDANSETRON HCL 4 MG/2ML IJ SOLN
4.0000 mg | Freq: Once | INTRAMUSCULAR | Status: AC
  Administered 2017-02-10: 4 mg via INTRAVENOUS
  Filled 2017-02-10: qty 2

## 2017-02-10 MED ORDER — MORPHINE SULFATE (PF) 4 MG/ML IV SOLN
4.0000 mg | Freq: Once | INTRAVENOUS | Status: AC
  Administered 2017-02-10: 4 mg via INTRAVENOUS
  Filled 2017-02-10: qty 1

## 2017-02-10 MED ORDER — SODIUM CHLORIDE 0.9 % IV BOLUS (SEPSIS)
500.0000 mL | Freq: Once | INTRAVENOUS | Status: AC
  Administered 2017-02-10: 500 mL via INTRAVENOUS

## 2017-02-10 MED ORDER — IOPAMIDOL (ISOVUE-300) INJECTION 61%
INTRAVENOUS | Status: AC
  Administered 2017-02-10: 100 mL
  Filled 2017-02-10: qty 30

## 2017-02-10 MED ORDER — ACETAMINOPHEN 325 MG PO TABS
650.0000 mg | ORAL_TABLET | Freq: Once | ORAL | Status: AC
  Administered 2017-02-10: 650 mg via ORAL
  Filled 2017-02-10: qty 2

## 2017-02-10 NOTE — ED Provider Notes (Signed)
Emergency Department Provider Note   I have reviewed the triage vital signs and the nursing notes.   HISTORY  Chief Complaint Abdominal Pain and Emesis   HPI [name] is a 66 y.o. female with PMH of HLD, HTN, GERD, and seizure disorder presents to the emergency department for evaluation of lower abdominal pain for the past 2 days with associated vomiting and diarrhea. The patient symptoms are moderate to severe in progressively worsening. She has difficulty tolerating fluids without vomiting. No hematemesis. No associated chest pain or difficulty breathing. Patient has a past surgical history of hernia requiring mesh repair and revision that occurred last year. No sick contacts. No alleviating factors. The patient has tried OTC meds with no relief.    Past Medical History:  Diagnosis Date  . Depression   . Epilepsy, grand mal ([place])    "last seizure ~ 2001" (06/16/2015)  . GERD (gastroesophageal reflux disease)   . High cholesterol   . Hypertension   . Migraine    "just about qd";  treated by Dr. [name] (10/15/2016)  . Pre-diabetes    she reports that she never took meds. for prediabetes, states MD has told her that she has corrected her situation with diet   . Seizures ([place])    "at one time; stopped in the 1990s" (10/15/2016)  . Stress at home    Pt. reports that she has a lot of personal stress & she is aware that it might being playing a part in her Migraine heaaches      Patient Active Problem List   Diagnosis Date Noted  . Meningioma ([place]) 01/09/2016  . Depression 11/08/2015  . Abnormal head movements 11/08/2015  . Somnolence, daytime 07/14/2015  . Incisional hernia 06/16/2015  . Chronic headaches 04/01/2013    Past Surgical History:  Procedure Laterality Date  . HERNIA REPAIR    . INCISIONAL HERNIA REPAIR N/A 06/16/2015   Procedure: LAPAROSCOPIC INCISIONAL HERNIA WITH MESH;  Surgeon: Coralie Keens, MD;  Location: [place] [facility] [place];  Service: General;  Laterality: N/A;   . [place]  10/15/2016  . INCISIONAL HERNIA REPAIR N/A 10/15/2016   Procedure: OPEN INCISIONAL HERNIA REPAIR WITH MESH;  Surgeon: Coralie Keens, MD;  Location: [place] [facility];  Service: General;  Laterality: N/A;  . INSERTION OF MESH N/A 06/16/2015   Procedure: INSERTION OF MESH;  Surgeon: Coralie Keens, MD;  Location: [facility];  Service: General;  Laterality: N/A;  . VAGINAL HYSTERECTOMY  ~ 1992    Current Outpatient Rx  . Order #: [id number] Class: Historical Med  . Order #: [id number] Class: Historical Med  . Order #: [id number] Class: Historical Med  . Order #: [id number] Class: Historical Med  . Order #: [id number] Class: Normal  . Order #: [id number] Class: Historical Med  . Order #: [id number] Class: Historical Med  . Order #: [id number] Class: Historical Med  . Order #: [id number] Class: Historical Med  . Order #: [id number] Class: Historical Med  . Order #: [id number] Class: Normal  . Order #: [id number] Class: Print  . Order #: [id number] Class: Normal  . Order #: [id number] Class: Normal  . Order #: [id number] Class: Historical Med  . Order #: [id number] Class: Historical Med  . Order #: [id number] Class: Print  . Order #: [id number] Class: Print  . Order #: [id number] Class: Print  . Order #: [id number] Class: Print    Allergies Nsaids; Aspirin; and Tolmetin  Family History  Problem Relation Age of Onset  . Lung cancer Mother   . Kidney failure Father   . Hypertension Other  Social History Social History  Substance Use Topics  . Smoking status: Never Smoker  . Smokeless tobacco: Never Used  . Alcohol use No    Review of Systems  Constitutional: No fever/chills Eyes: No visual changes. ENT: No sore throat. Cardiovascular: Denies chest pain. Respiratory: Denies shortness of breath. Gastrointestinal: Positive lower abdominal pain. Positive nausea, vomiting, and diarrhea.  No constipation. Genitourinary: Negative for dysuria. Musculoskeletal: Negative for back pain. Skin:  Negative for rash. Neurological: Negative for headaches, focal weakness or numbness.  10-point ROS otherwise negative.  ____________________________________________   PHYSICAL EXAM:  VITAL SIGNS: ED Triage Vitals  Enc Vitals Group     BP 02/10/17 2001 (!) 127/48     Pulse Rate 02/10/17 2001 108     Resp 02/10/17 2001 16     Temp 02/10/17 2001 99.6 F (37.6 C)     Temp Source 02/10/17 2001 Oral     SpO2 02/10/17 2001 99 %     Pain Score 02/10/17 1959 8   Constitutional: Alert and oriented. Well appearing and in no acute distress. Eyes: Conjunctivae are normal.  Head: Atraumatic. Nose: No congestion/rhinnorhea. Mouth/Throat: Mucous membranes are moist.  Oropharynx non-erythematous. Neck: No stridor.   Cardiovascular: Normal rate, regular rhythm. Good peripheral circulation. Grossly normal heart sounds.   Respiratory: Normal respiratory effort.  No retractions. Lungs CTAB. Gastrointestinal: Soft with diffuse lower abdominal tenderness with mild guarding. No distention.  Musculoskeletal: No lower extremity tenderness nor edema. No gross deformities of extremities. Neurologic:  Normal speech and language. No gross focal neurologic deficits are appreciated.  Skin:  Skin is warm, dry and intact. No rash noted.  ____________________________________________   LABS (all labs ordered are listed, but only abnormal results are displayed)  Labs Reviewed  COMPREHENSIVE METABOLIC PANEL - Abnormal; Notable for the following:       Result Value   Sodium 134 (*)    Chloride 97 (*)    Glucose, Bld 124 (*)    ALT 12 (*)    GFR calc non Af Amer 57 (*)    All other components within normal limits  CBC - Abnormal; Notable for the following:    WBC 15.2 (*)    All other components within normal limits  LIPASE, BLOOD  URINALYSIS, ROUTINE W REFLEX MICROSCOPIC   ____________________________________________  RADIOLOGY  CT abdomen/pelvis:   IMPRESSION:  1. Mild wall thickening with  mucosal enhancement involving the  descending colon and rectosigmoid colon, suspicious for colitis of  infectious or inflammatory etiology.  2. Gallstones  3. Interval surgical repair of left anterior pelvic hernia.      Electronically Signed  By: Donavan Foil M.D.  On: 02/11/2017 00:07    ____________________________________________   PROCEDURES  Procedure(s) performed:   Procedures  None ____________________________________________   INITIAL IMPRESSION / ASSESSMENT AND PLAN / ED COURSE  Pertinent labs & imaging results that were available during my care of the patient were reviewed by me and considered in my medical decision making (see chart for details).  Patient resents to the emergency department for evaluation of abdominal pain, vomiting, diarrhea. She does have some guarding in the lower quadrants of her abdomen with no rebound tenderness. No distention. Low suspicion for bowel obstruction with diarrhea as a major component for this. May be gastroenteritis but will obtain CT scan given the patient's age, surgical history, concerning exam to evaluate for possible diverticulitis.   CT with concern for colitis without abscess or evidence of perforation. Offered admission for abx and  symptoms mgmt but patient is feeling better and would like to try treatment at home. She is tolerating medication and PO prior to discharge. Will follow with the PCP in the coming days. Discussed return precautions in detail. Patient is ambulatory without difficulty in the ED.   At this time, I do not feel there is any life-threatening condition present. I have reviewed and discussed all results (EKG, imaging, lab, urine as appropriate), exam findings with patient. I have reviewed nursing notes and appropriate previous records.  I feel the patient is safe to be discharged home without further emergent workup. Discussed usual and customary return precautions. Patient and family (if present)  verbalize understanding and are comfortable with this plan.  Patient will follow-up with their primary care provider. If they do not have a primary care provider, information for follow-up has been provided to them. All questions have been answered.  ____________________________________________  FINAL CLINICAL IMPRESSION(S) / ED DIAGNOSES  Final diagnoses:  Colitis     MEDICATIONS GIVEN DURING THIS VISIT:  Medications  morphine 4 MG/ML injection 4 mg (4 mg Intravenous Given 02/10/17 2109)  ondansetron (ZOFRAN) injection 4 mg (4 mg Intravenous Given 02/10/17 2109)  sodium chloride 0.9 % bolus 500 mL (0 mLs Intravenous Stopped 02/10/17 2211)  iopamidol (ISOVUE-300) 61 % injection (100 mLs  Contrast Given 02/10/17 2322)  acetaminophen (TYLENOL) tablet 650 mg (650 mg Oral Given 02/10/17 2210)  ciprofloxacin (CIPRO) tablet 500 mg (500 mg Oral Given 02/11/17 0029)  metroNIDAZOLE (FLAGYL) tablet 500 mg (500 mg Oral Given 02/11/17 0028)     NEW OUTPATIENT MEDICATIONS STARTED DURING THIS VISIT:  Discharge Medication List as of 02/11/2017 12:17 AM    START taking these medications   Details  ciprofloxacin (CIPRO) 500 MG tablet Take 1 tablet (500 mg total) by mouth 2 (two) times daily., Starting Mon 02/11/2017, Until Mon 02/18/2017, Print    docusate sodium (COLACE) 100 MG capsule Take 1 capsule (100 mg total) by mouth daily as needed for mild constipation., Starting Mon 02/11/2017, Print    metroNIDAZOLE (FLAGYL) 500 MG tablet Take 1 tablet (500 mg total) by mouth 2 (two) times daily., Starting Mon 02/11/2017, Until Mon 02/18/2017, Print    ondansetron (ZOFRAN ODT) 4 MG disintegrating tablet Take 1 tablet (4 mg total) by mouth every 8 (eight) hours as needed for nausea or vomiting., Starting Mon 02/11/2017, Print        Note:  This document was prepared using Dragon voice recognition software and may include unintentional dictation errors.  Nanda Quinton, MD Emergency Medicine   Margette Fast,  MD 02/12/17 [phone]

## 2017-02-10 NOTE — ED Triage Notes (Signed)
Pt complaining of lower abdominal pain x 2 days. Pt states emesis and diarrhea x 3 today. Pt denies any urinary symptoms.

## 2017-02-10 NOTE — ED Notes (Signed)
Pt reports headache ever since admin of morphine, per Dr. Laverta Baltimore admin tylenol

## 2017-02-11 DIAGNOSIS — K529 Noninfective gastroenteritis and colitis, unspecified: Secondary | ICD-10-CM | POA: Diagnosis not present

## 2017-02-11 MED ORDER — METRONIDAZOLE 500 MG PO TABS: 500.0000 mg | ORAL_TABLET | Freq: Two times a day (BID) | ORAL | 0 refills | Status: AC

## 2017-02-11 MED ORDER — METRONIDAZOLE 500 MG PO TABS
500.0000 mg | ORAL_TABLET | Freq: Once | ORAL | Status: AC
  Administered 2017-02-11: 500 mg via ORAL
  Filled 2017-02-11: qty 1

## 2017-02-11 MED ORDER — ONDANSETRON 4 MG PO TBDP: 4.0000 mg | ORAL_TABLET | Freq: Three times a day (TID) | ORAL | 0 refills | Status: DC | PRN

## 2017-02-11 MED ORDER — DOCUSATE SODIUM 100 MG PO CAPS: 100.0000 mg | ORAL_CAPSULE | Freq: Every day | ORAL | 0 refills | Status: DC | PRN

## 2017-02-11 MED ORDER — CIPROFLOXACIN HCL 500 MG PO TABS
500.0000 mg | ORAL_TABLET | Freq: Once | ORAL | Status: AC
  Administered 2017-02-11: 500 mg via ORAL
  Filled 2017-02-11: qty 1

## 2017-02-11 MED ORDER — CIPROFLOXACIN HCL 500 MG PO TABS: 500.0000 mg | ORAL_TABLET | Freq: Two times a day (BID) | ORAL | 0 refills | Status: AC

## 2017-02-11 NOTE — Discharge Instructions (Signed)
We believe your symptoms are caused by diverticulitis.  Most of the time this condition (please read through the included information) can be cured with outpatient antibiotics.  Please take the full course of prescribed medication(s) and follow up with the doctors recommended above.  Return to the ED if your abdominal pain worsens or fails to improve, you develop bloody vomiting, bloody diarrhea, you are unable to tolerate fluids due to vomiting, fever greater than 101, or other symptoms that concern you.  Take Oxycodone as prescribed for severe pain. Do not drink alcohol, drive or participate in any other potentially dangerous activities while taking this medication as it may make you sleepy. Do not take this medication with any other sedating medications, either prescription or over-the-counter. If you were prescribed Percocet or Vicodin, do not take these with acetaminophen (Tylenol) as it is already contained within these medications.   This medication is an opiate (or narcotic) pain medication and can be habit forming.  Use it as little as possible to achieve adequate pain control.  Do not use or use it with extreme caution if you have a history of opiate abuse or dependence.  If you are on a pain contract with your primary care doctor or a pain specialist, be sure to let them know you were prescribed this medication today from the Emergency Department.  This medication is intended for your use only - do not give any to anyone else and keep it in a secure place where nobody else, especially children, have access to it.  It will also cause or worsen constipation, so you may want to consider taking an over-the-counter stool softener while you are taking this medication.

## 2017-02-12 DIAGNOSIS — K529 Noninfective gastroenteritis and colitis, unspecified: Secondary | ICD-10-CM | POA: Diagnosis not present

## 2017-02-12 DIAGNOSIS — K8020 Calculus of gallbladder without cholecystitis without obstruction: Secondary | ICD-10-CM | POA: Diagnosis not present

## 2017-02-15 DIAGNOSIS — F321 Major depressive disorder, single episode, moderate: Secondary | ICD-10-CM | POA: Diagnosis not present

## 2017-02-15 DIAGNOSIS — F329 Major depressive disorder, single episode, unspecified: Secondary | ICD-10-CM | POA: Diagnosis not present

## 2017-02-15 DIAGNOSIS — G3184 Mild cognitive impairment, so stated: Secondary | ICD-10-CM | POA: Diagnosis not present

## 2017-02-15 DIAGNOSIS — F419 Anxiety disorder, unspecified: Secondary | ICD-10-CM | POA: Diagnosis not present

## 2017-02-15 DIAGNOSIS — D329 Benign neoplasm of meninges, unspecified: Secondary | ICD-10-CM | POA: Diagnosis not present

## 2017-02-18 DIAGNOSIS — R293 Abnormal posture: Secondary | ICD-10-CM | POA: Diagnosis not present

## 2017-02-18 DIAGNOSIS — M542 Cervicalgia: Secondary | ICD-10-CM | POA: Diagnosis not present

## 2017-02-18 DIAGNOSIS — G43009 Migraine without aura, not intractable, without status migrainosus: Secondary | ICD-10-CM | POA: Diagnosis not present

## 2017-02-18 DIAGNOSIS — M7541 Impingement syndrome of right shoulder: Secondary | ICD-10-CM | POA: Diagnosis not present

## 2017-02-18 DIAGNOSIS — M25511 Pain in right shoulder: Secondary | ICD-10-CM | POA: Diagnosis not present

## 2017-02-20 DIAGNOSIS — N3941 Urge incontinence: Secondary | ICD-10-CM | POA: Diagnosis not present

## 2017-02-20 DIAGNOSIS — R350 Frequency of micturition: Secondary | ICD-10-CM | POA: Diagnosis not present

## 2017-02-27 ENCOUNTER — Other Ambulatory Visit: Payer: Self-pay | Admitting: Neurology

## 2017-03-18 DIAGNOSIS — I10 Essential (primary) hypertension: Secondary | ICD-10-CM | POA: Diagnosis not present

## 2017-03-18 DIAGNOSIS — K219 Gastro-esophageal reflux disease without esophagitis: Secondary | ICD-10-CM | POA: Diagnosis not present

## 2017-03-18 DIAGNOSIS — E785 Hyperlipidemia, unspecified: Secondary | ICD-10-CM | POA: Diagnosis not present

## 2017-03-18 DIAGNOSIS — E119 Type 2 diabetes mellitus without complications: Secondary | ICD-10-CM | POA: Diagnosis not present

## 2017-03-19 DIAGNOSIS — Z79891 Long term (current) use of opiate analgesic: Secondary | ICD-10-CM | POA: Diagnosis not present

## 2017-03-19 DIAGNOSIS — M542 Cervicalgia: Secondary | ICD-10-CM | POA: Diagnosis not present

## 2017-03-19 DIAGNOSIS — M7541 Impingement syndrome of right shoulder: Secondary | ICD-10-CM | POA: Diagnosis not present

## 2017-03-19 DIAGNOSIS — G894 Chronic pain syndrome: Secondary | ICD-10-CM | POA: Diagnosis not present

## 2017-03-19 DIAGNOSIS — G43009 Migraine without aura, not intractable, without status migrainosus: Secondary | ICD-10-CM | POA: Diagnosis not present

## 2017-03-19 DIAGNOSIS — M25511 Pain in right shoulder: Secondary | ICD-10-CM | POA: Diagnosis not present

## 2017-03-19 DIAGNOSIS — R293 Abnormal posture: Secondary | ICD-10-CM | POA: Diagnosis not present

## 2017-03-26 DIAGNOSIS — M25511 Pain in right shoulder: Secondary | ICD-10-CM | POA: Diagnosis not present

## 2017-03-26 DIAGNOSIS — M7541 Impingement syndrome of right shoulder: Secondary | ICD-10-CM | POA: Diagnosis not present

## 2017-03-26 DIAGNOSIS — M542 Cervicalgia: Secondary | ICD-10-CM | POA: Diagnosis not present

## 2017-03-26 DIAGNOSIS — R293 Abnormal posture: Secondary | ICD-10-CM | POA: Diagnosis not present

## 2017-03-29 DIAGNOSIS — R293 Abnormal posture: Secondary | ICD-10-CM | POA: Diagnosis not present

## 2017-03-29 DIAGNOSIS — M25511 Pain in right shoulder: Secondary | ICD-10-CM | POA: Diagnosis not present

## 2017-03-29 DIAGNOSIS — M542 Cervicalgia: Secondary | ICD-10-CM | POA: Diagnosis not present

## 2017-03-29 DIAGNOSIS — M7541 Impingement syndrome of right shoulder: Secondary | ICD-10-CM | POA: Diagnosis not present

## 2017-04-02 DIAGNOSIS — M25511 Pain in right shoulder: Secondary | ICD-10-CM | POA: Diagnosis not present

## 2017-04-02 DIAGNOSIS — M542 Cervicalgia: Secondary | ICD-10-CM | POA: Diagnosis not present

## 2017-04-02 DIAGNOSIS — R293 Abnormal posture: Secondary | ICD-10-CM | POA: Diagnosis not present

## 2017-04-02 DIAGNOSIS — M7541 Impingement syndrome of right shoulder: Secondary | ICD-10-CM | POA: Diagnosis not present

## 2017-04-05 DIAGNOSIS — R293 Abnormal posture: Secondary | ICD-10-CM | POA: Diagnosis not present

## 2017-04-05 DIAGNOSIS — M7541 Impingement syndrome of right shoulder: Secondary | ICD-10-CM | POA: Diagnosis not present

## 2017-04-05 DIAGNOSIS — M25511 Pain in right shoulder: Secondary | ICD-10-CM | POA: Diagnosis not present

## 2017-04-05 DIAGNOSIS — M542 Cervicalgia: Secondary | ICD-10-CM | POA: Diagnosis not present

## 2017-04-06 ENCOUNTER — Other Ambulatory Visit: Payer: Self-pay | Admitting: Nurse Practitioner

## 2017-04-09 DIAGNOSIS — M542 Cervicalgia: Secondary | ICD-10-CM | POA: Diagnosis not present

## 2017-04-09 DIAGNOSIS — M25511 Pain in right shoulder: Secondary | ICD-10-CM | POA: Diagnosis not present

## 2017-04-09 DIAGNOSIS — M7541 Impingement syndrome of right shoulder: Secondary | ICD-10-CM | POA: Diagnosis not present

## 2017-04-09 DIAGNOSIS — R293 Abnormal posture: Secondary | ICD-10-CM | POA: Diagnosis not present

## 2017-04-12 DIAGNOSIS — M7541 Impingement syndrome of right shoulder: Secondary | ICD-10-CM | POA: Diagnosis not present

## 2017-04-12 DIAGNOSIS — M542 Cervicalgia: Secondary | ICD-10-CM | POA: Diagnosis not present

## 2017-04-12 DIAGNOSIS — M25511 Pain in right shoulder: Secondary | ICD-10-CM | POA: Diagnosis not present

## 2017-04-12 DIAGNOSIS — R293 Abnormal posture: Secondary | ICD-10-CM | POA: Diagnosis not present

## 2017-04-16 DIAGNOSIS — M25511 Pain in right shoulder: Secondary | ICD-10-CM | POA: Diagnosis not present

## 2017-04-16 DIAGNOSIS — M542 Cervicalgia: Secondary | ICD-10-CM | POA: Diagnosis not present

## 2017-04-16 DIAGNOSIS — R293 Abnormal posture: Secondary | ICD-10-CM | POA: Diagnosis not present

## 2017-04-16 DIAGNOSIS — M7541 Impingement syndrome of right shoulder: Secondary | ICD-10-CM | POA: Diagnosis not present

## 2017-04-18 DIAGNOSIS — R293 Abnormal posture: Secondary | ICD-10-CM | POA: Diagnosis not present

## 2017-04-18 DIAGNOSIS — M542 Cervicalgia: Secondary | ICD-10-CM | POA: Diagnosis not present

## 2017-04-18 DIAGNOSIS — M7541 Impingement syndrome of right shoulder: Secondary | ICD-10-CM | POA: Diagnosis not present

## 2017-04-18 DIAGNOSIS — M25511 Pain in right shoulder: Secondary | ICD-10-CM | POA: Diagnosis not present

## 2017-04-19 DIAGNOSIS — H25043 Posterior subcapsular polar age-related cataract, bilateral: Secondary | ICD-10-CM | POA: Diagnosis not present

## 2017-04-19 DIAGNOSIS — H25013 Cortical age-related cataract, bilateral: Secondary | ICD-10-CM | POA: Diagnosis not present

## 2017-04-19 DIAGNOSIS — H2513 Age-related nuclear cataract, bilateral: Secondary | ICD-10-CM | POA: Diagnosis not present

## 2017-04-19 DIAGNOSIS — H524 Presbyopia: Secondary | ICD-10-CM | POA: Diagnosis not present

## 2017-04-23 ENCOUNTER — Ambulatory Visit: Payer: Medicare Other | Admitting: Neurology

## 2017-05-01 DIAGNOSIS — Z86011 Personal history of benign neoplasm of the brain: Secondary | ICD-10-CM | POA: Diagnosis not present

## 2017-05-01 DIAGNOSIS — E1122 Type 2 diabetes mellitus with diabetic chronic kidney disease: Secondary | ICD-10-CM | POA: Diagnosis not present

## 2017-05-01 DIAGNOSIS — N189 Chronic kidney disease, unspecified: Secondary | ICD-10-CM | POA: Diagnosis not present

## 2017-05-01 DIAGNOSIS — Z888 Allergy status to other drugs, medicaments and biological substances status: Secondary | ICD-10-CM | POA: Diagnosis not present

## 2017-05-01 DIAGNOSIS — Z886 Allergy status to analgesic agent status: Secondary | ICD-10-CM | POA: Diagnosis not present

## 2017-05-01 DIAGNOSIS — Z79899 Other long term (current) drug therapy: Secondary | ICD-10-CM | POA: Diagnosis not present

## 2017-05-01 DIAGNOSIS — R51 Headache: Secondary | ICD-10-CM | POA: Diagnosis not present

## 2017-05-01 DIAGNOSIS — E119 Type 2 diabetes mellitus without complications: Secondary | ICD-10-CM | POA: Diagnosis not present

## 2017-05-01 DIAGNOSIS — G44021 Chronic cluster headache, intractable: Secondary | ICD-10-CM | POA: Insufficient documentation

## 2017-05-01 DIAGNOSIS — I129 Hypertensive chronic kidney disease with stage 1 through stage 4 chronic kidney disease, or unspecified chronic kidney disease: Secondary | ICD-10-CM | POA: Diagnosis not present

## 2017-05-01 DIAGNOSIS — G44009 Cluster headache syndrome, unspecified, not intractable: Secondary | ICD-10-CM | POA: Diagnosis not present

## 2017-05-13 ENCOUNTER — Ambulatory Visit (INDEPENDENT_AMBULATORY_CARE_PROVIDER_SITE_OTHER): Payer: Medicare Other | Admitting: Podiatry

## 2017-05-13 ENCOUNTER — Encounter: Payer: Self-pay | Admitting: Podiatry

## 2017-05-13 DIAGNOSIS — L84 Corns and callosities: Secondary | ICD-10-CM | POA: Diagnosis not present

## 2017-05-13 DIAGNOSIS — M2042 Other hammer toe(s) (acquired), left foot: Secondary | ICD-10-CM | POA: Diagnosis not present

## 2017-05-13 DIAGNOSIS — M2041 Other hammer toe(s) (acquired), right foot: Secondary | ICD-10-CM

## 2017-05-14 NOTE — Progress Notes (Signed)
Subjective:    Patient ID: [name], female   DOB: 66 y.o.   MRN: [id number]   HPI patient states she gets a lot of lesions on the big toes of both feet and the fifth digits of both feet become painful and she cannot wear shoe gear comfortably    ROS      Objective:  Physical Exam Neurovascular status was intact with muscle strength adequate with patient found to have thick keratotic lesion first metatarsal and hallux right over left with deviation the hallux and irritation with redness around the head of the proximal phalanx fifth digit bilateral    Assessment:    Keratotic tissue formation secondary to structure with hammertoe deformity and pain fifth digit bilateral     Plan:    H&P conditions reviewed and debrided lesions on the first metatarsal hallux bilateral and applied padding to the fifth digits to pressure off the toes. Reappoint for [place] to recheck

## 2017-05-15 ENCOUNTER — Encounter: Payer: Self-pay | Admitting: Neurology

## 2017-05-15 ENCOUNTER — Ambulatory Visit (INDEPENDENT_AMBULATORY_CARE_PROVIDER_SITE_OTHER): Payer: Medicare Other | Admitting: Neurology

## 2017-05-15 VITALS — BP 117/68 | HR 73 | Ht <= 58 in | Wt 133.5 lb

## 2017-05-15 DIAGNOSIS — G8929 Other chronic pain: Secondary | ICD-10-CM

## 2017-05-15 DIAGNOSIS — D329 Benign neoplasm of meninges, unspecified: Secondary | ICD-10-CM

## 2017-05-15 DIAGNOSIS — R51 Headache: Secondary | ICD-10-CM | POA: Diagnosis not present

## 2017-05-15 DIAGNOSIS — Z79899 Other long term (current) drug therapy: Secondary | ICD-10-CM | POA: Diagnosis not present

## 2017-05-15 DIAGNOSIS — R519 Headache, unspecified: Secondary | ICD-10-CM

## 2017-05-15 MED ORDER — TRAMADOL HCL 50 MG PO TABS: 50.0000 mg | ORAL_TABLET | Freq: Four times a day (QID) | ORAL | 0 refills | Status: DC | PRN

## 2017-05-15 NOTE — Progress Notes (Signed)
[facility]  PATIENT: [name] DOB: 04-04-1951    HISTORY OF PRESENT ILLNESS:  HISTORY: Right-handed African American married female with daily left hemicranial headaches lasting 30 minutes to 1-1/2 hours usually occurring 5 or 6 times per day, previous patient of Dr. Erling Cruz.  Her headaches are always on the left side. She also has a history of complex partial  seizures, last seizure was 2007.  She had an MRI studies of her brain 05/2009, 09/15/09, and 07/02/12 showing stable en plaque meningiomas, one over the left lateral convexity and the other in the middle of the anterior fossa.  There is improvement in the frequency and severity of her headaches with the use of Indomethacin . It cuts down her headaches approximately 75%. Indomethacin is associated with elevated creatinine. She is followed by Dr.Webb. Her creatinine runs 1.19. Because of this she discontinued her indomethacin .  She tried Botox by Dr. Tyron Russell and [place] without benefit . Nasal oxygen increases the speed with which her headache pain relief occurs. She developed headaches in February 2012 and went to the emergency room. She went to the emergency room 10/19/213 with headaches. She continued to have headaches after her ER visit. They are worse at night than during the day. They are not associated with eyelid droop, nasal stuffiness, or tearing. She has gained weight on divalproex sodium.   She was on polypharmacy: Depakote Topamax tizanidine magnesium oxide verapamil and Relpax. She woke up with a headache this morning however she did not take her Relpax. Her husband reports that she snores at night. She complains of significant daytime drowsiness and scores 15 on ESS. She also complains of frequent awakening at night. She is wanting to decrease her Topamax as she has not had a seizure in many years. With the frequency of her headaches I'm not sure her medications are working for her anyway.   sleep study  in Sep 2016 was negative for obstructive sleep apnea.   She went to the emergency room November 7 and received a migraine cocktail with relief. She had a headache the next day and took hydrocodone. She claims Relpax no longer works for her acutely. She is currently on Topamax, Depakote, verapamil and tizanidine as preventatives. She has also been on Reglan for many years and I noticed some abnormal movements of her face in 2016,   Over counter  Tylenol daily  2 to 4 tabs she is probably getting some rebound effect. She has chronic renal disease most recent creatinine 1.21 Her review of medications that have been tried in the past include the following.  Analgesic, Tylenol Percocet Demerol Stadol Tylenol 3, Tylenol No. 4, Ultram Dilaudid and Norco Acute meds Amerge, D.H.E. Imitrex spray Maxalt Migranal Sansert, Zomig and Relpax Antihypertensives atenolol, Calan, currently on verapamil Antinausea Phenergan Reglan Zofran Anti-inflammatory Indocin Celebrex diclofenac Vioxx Muscle relaxants baclofen Robaxin Flexeril currently on Zanaflex Convulsants Lamictal, gabapentin, Lyrica, Keppra, and Zonegran, currently on Depakote Topamax and Horizant Steroids prednisone Tranquilizers/ sleepers Thorazine Zyprexa and Vistaril Antidepressant Elavil, lithium, Paxil, Prozac, and imipramine  Other currently on magnesium  ,  UPDATE Jan 09 2016:YY She is with her husband at today's clinical visit, she reported that her headache overall is under better control, she was given SUMAtriptan Succinate (ONZETRA XSAIL) 11 MG/nasal spray, which has helped her symptoms,  she is having headaches 2-3 times each week, always on the left side, she also noticed mild right leg weakness, mild unsteady gait,  MRI of the brain  with without contrast in December 2016 Slow growth of 2 meningiomas since study of 2010. Left planum sphenoidale meningioma has increased from 5 x 7 mm to 11 x 12 mm. Left frontoparietal convexity  meningioma has increased from 14 x 5 mm to 14 x 8 mm. She had a history of seizure in the past, last seizure was more than decade ago, but patient reported that her seizure tends to proceed with right arm and leg shaking  She is going to pain management clinic in [place]. She returns for reevaluation  UPDATE May 15 2017: She still has daily headaches, 2-3 times each day, always on the left side, involving her left retro-orbital occipital region, and vortex region, now taking daily Tylenol 2 tablets 4 times a day, and oxycodone, 2 tablets 4 times a day, get oxycodone 5mg  prescription from her pain management in [place],  I have reviewed her controlled substance registry, she is getting oxycodone 5 mg 60 tablets regularly, the most recent prescription was on April 13 2017.  We also personally reviewed MRI of brain on December 21 2015, in comparison to previous study in 2010: There is evidence of meningioma involving left planum sphenoid, measuring 11 times 12 mm, increased from 5 times 7 mm, left frontal parietal convexity meningioma increased to 14 times 8 mm from 14 times 5 mm   REVIEW OF SYSTEMS: Full 14 system review of systems performed and notable only for those listed, all others are neg:  Unexpected weight change, running nose, light sensitivity, blackout, constipation, incontinence of bladder, headaches, tremor  ALLERGIES: Allergies  Allergen Reactions  . Nsaids Other (See Comments)    Can cause ulcers  . Aspirin Nausea Only and Other (See Comments)    Could cause ulcers   . Tolmetin Other (See Comments)    Can cause ulcers    HOME MEDICATIONS: Outpatient Medications Prior to Visit  Medication Sig Dispense Refill  . acetaminophen (TYLENOL) 500 MG tablet Take 1,000 mg by mouth every 6 (six) hours as needed for mild pain.    Marland Kitchen amLODipine (NORVASC) 5 MG tablet Take 5 mg by mouth daily after breakfast.     . atorvastatin (LIPITOR) 40 MG tablet Take 40 mg by mouth daily  after breakfast.     . Calcium Carb-Cholecalciferol (CALCIUM 600 + D) 600-200 MG-UNIT TABS Take 1 tablet by mouth daily.    . divalproex (DEPAKOTE ER) 500 MG 24 hr tablet TAKE 1 TABLET BY MOUTH EVERY MORNING AND TAKE 2 TABLETS BY MOUTH EVERY EVENING 270 tablet 3  . esomeprazole (NEXIUM) 40 MG capsule Take 40 mg by mouth daily after breakfast.     . ferrous sulfate 325 (65 FE) MG tablet Take 325 mg by mouth daily with breakfast.    . magnesium oxide (MAG-OX) 400 MG tablet Take 400 mg by mouth 2 (two) times daily.     . Multiple Vitamin (MULTIVITAMIN WITH MINERALS) TABS Take 1 tablet by mouth daily.    Marland Kitchen olmesartan (BENICAR) 40 MG tablet Take 40 mg by mouth daily after breakfast.     . ondansetron (ZOFRAN ODT) 4 MG disintegrating tablet Take 1 tablet (4 mg total) by mouth every 8 (eight) hours as needed for nausea or vomiting. 20 tablet 0  . ondansetron (ZOFRAN) 4 MG tablet Take 1 tablet (4 mg total) by mouth every 8 (eight) hours as needed for nausea or vomiting. 20 tablet 2  . oxyCODONE (OXY IR/ROXICODONE) 5 MG immediate release tablet Take 1-2 tablets (5-10 mg total)  by mouth every 4 (four) hours as needed for moderate pain. 40 tablet 0  . tiZANidine (ZANAFLEX) 2 MG tablet TAKE 1 TABLET BY MOUTH TWICE A DAY 180 tablet 1  . TOVIAZ 8 MG TB24 tablet Take 8 mg by mouth at bedtime.   11  . traMADol (ULTRAM) 50 MG tablet Take 50 mg by mouth 3 (three) times daily as needed for moderate pain (pain).   4  . docusate sodium (COLACE) 100 MG capsule Take 1 capsule (100 mg total) by mouth daily as needed for mild constipation. 60 capsule 0  . SUMAtriptan Succinate (ONZETRA XSAIL) 11 MG/NOSEPC EXHP Place 11 mg into the nose as needed. one per nostril at onset of migraine, may repeat dose after 2 hours do not exceed 2 doses in 24 hours 2 each 6   No facility-administered medications prior to visit.     PAST MEDICAL HISTORY: Past Medical History:  Diagnosis Date  . Depression   . Epilepsy, grand mal ([place])      "last seizure ~ 2001" (06/16/2015)  . GERD (gastroesophageal reflux disease)   . High cholesterol   . Hypertension   . Migraine    "just about qd";  treated by Dr. [name] (10/15/2016)  . Pre-diabetes    she reports that she never took meds. for prediabetes, states MD has told her that she has corrected her situation with diet   . Seizures ([place])    "at one time; stopped in the 1990s" (10/15/2016)  . Stress at home    Pt. reports that she has a lot of personal stress & she is aware that it might being playing a part in her Migraine heaaches      PAST SURGICAL HISTORY: Past Surgical History:  Procedure Laterality Date  . HERNIA REPAIR    . INCISIONAL HERNIA REPAIR N/A 06/16/2015   Procedure: LAPAROSCOPIC INCISIONAL HERNIA WITH MESH;  Surgeon: Coralie Keens, MD;  Location: [place];  Service: General;  Laterality: N/A;  . [place]  10/15/2016  . INCISIONAL HERNIA REPAIR N/A 10/15/2016   Procedure: OPEN INCISIONAL HERNIA REPAIR WITH MESH;  Surgeon: Coralie Keens, MD;  Location: [place];  Service: General;  Laterality: N/A;  . INSERTION OF MESH N/A 06/16/2015   Procedure: INSERTION OF MESH;  Surgeon: Coralie Keens, MD;  Location: [place];  Service: General;  Laterality: N/A;  . VAGINAL HYSTERECTOMY  ~ 1992    FAMILY HISTORY: Family History  Problem Relation Age of Onset  . Lung cancer Mother   . Kidney failure Father   . Hypertension Other     SOCIAL HISTORY: Social History   Social History  . Marital status: Married    Spouse name: Elenore Rota  . Number of children: 3  . Years of education: college   Occupational History  .      does not work   Social History Main Topics  . Smoking status: Never Smoker  . Smokeless tobacco: Never Used  . Alcohol use No  . Drug use: No  . Sexual activity: Yes   Other Topics Concern  . Not on file   Social History Narrative   Patient lives at home with her husband Elenore Rota). Patient was a homemaker.   Right handed.    College education.   Caffeine- One cup of coffee daily.   Patient has three children.     PHYSICAL EXAM  Vitals:   05/15/17 1108  BP: 107/62  Pulse: 68  Weight: 118 lb (53.5 kg)  Height: 4\' 9"  (1.448 m)   Body mass index is 25.53 kg/m.  Generalized: Well developed, in no acute distress  Head: normocephalic and atraumatic,. Oropharynx benign  Neck: Supple, no carotid bruits  Cardiac: Regular rate rhythm, no murmur  Musculoskeletal: No deformity   Neurological examination depressed looking elderly female.  Mentation: Alert oriented to time, place, history taking. Attention span and concentration appropriate. Recent and remote memory intact.  Follows all commands speech and language fluent.   Cranial nerve II-XII: Fundoscopic exam reveals sharp disc margins.Pupils were equal round reactive to light extraocular movements were full, visual field were full on confrontational test. Facial sensation and strength were normal. hearing was intact to finger rubbing bilaterally. Uvula tongue midline. head turning and shoulder shrug were normal and symmetric.Tongue protrusion into cheek strength was normal. Motor: normal bulk and tone, full strength in the BUE, BLE, fine finger movements normal, no pronator drift. No focal weakness Sensory: normal and symmetric to light touch, pinprick, and  Vibration, in the fingers and toes  Coordination: finger-nose-finger, heel-to-shin bilaterally, no dysmetria Reflexes: Brachioradialis 2/2, biceps 2/2, triceps 2/2, patellar 2/2, Achilles 2/2, plantar responses were flexor bilaterally. Gait and Station: Rising up from seated position without assistance, normal stance,  moderate stride, good arm swing, smooth turning, able to perform tiptoe, and heel walking without difficulty. Tandem gait is steady. No assistive device  DIAGNOSTIC DATA (LABS, IMAGING, TESTING) - I reviewed patient records, labs, notes, testing and imaging myself where available.  Lab  Results  Component Value Date   WBC 15.2 (H) 02/10/2017   HGB 13.4 02/10/2017   HCT 41.6 02/10/2017   MCV 95.6 02/10/2017   PLT 240 02/10/2017      Component Value Date/Time   NA 134 (L) 02/10/2017 2024   K 4.3 02/10/2017 2024   CL 97 (L) 02/10/2017 2024   CO2 25 02/10/2017 2024   GLUCOSE 124 (H) 02/10/2017 2024   BUN 18 02/10/2017 2024   CREATININE 1.00 02/10/2017 2024   CALCIUM 9.2 02/10/2017 2024   PROT 7.2 02/10/2017 2024   ALBUMIN 4.1 02/10/2017 2024   AST 24 02/10/2017 2024   ALT 12 (L) 02/10/2017 2024   ALKPHOS 60 02/10/2017 2024   BILITOT 0.6 02/10/2017 2024   GFRNONAA 57 (L) 02/10/2017 2024   GFRAA >60 02/10/2017 2024     ASSESSMENT AND PLAN  67 y.o. year old female  Chronic headache Polypharmacy treatment, Chronic left frontoparietal, left planum sphenoid meningioma  She has tried and failed multiple preventative medications in the past, detailed in previous note,  She is not satisfied with the treatment our office has provided, I will refer her to do comprehensive headache clinic,   Marcial Pacas, M.D. Ph.D.  [facility] [place], [place] [address] Phone: [phone] Fax:      [phone]

## 2017-05-15 NOTE — Patient Instructions (Signed)
[facility]  Our providers have experience caring for both common and complex headache disorders, with  nationally recognized expertise in treating vertigo, chronic migraine, and neurologic causes of head pain. We work closely with other providers trained in neurosurgery, psychology and pain management to provide our patients with comprehensive, tailored care.  Our physicians diagnosis and treat common headache disorders including migraine and tension headache, and also have experience in less common head pain conditions including trigeminal neuralgia, atypical face pain, post-concussion headache, and headache due to other diseases.  We offer traditional medical therapies for headaches, as well as interventional treatments including triggerpoint injections, nerve blocks, and Botox injections. Our clinic also offers IV therapy for several types of headache pain.  In addition to our physician faculty, the clinic nursing staff have a dedicated phone line for assisting patients with questions regarding medications, treatment plans, and helping with medication authorizations with insurance companies.  Our providers  T Frederico Hamman, MD Read more about Dr. Theda Sers in his "Faculty Spotlight" interview.

## 2017-05-22 ENCOUNTER — Telehealth: Payer: Self-pay | Admitting: Neurology

## 2017-05-22 NOTE — Telephone Encounter (Signed)
I have attempted to contact pt/husband mult times,  Either the phone rings with no answer, or it sounds as it someone picks up the receiver and then hangs it up./fim

## 2017-05-22 NOTE — Telephone Encounter (Signed)
The patient is aware that there is no fee for filling out prescription paperwork for new medication.  However, Dr. [name] has does not wish to prescribe Aimovig at this time.  The patient verbalized understanding and would like to proceed with her referral to [name].

## 2017-05-22 NOTE — Telephone Encounter (Signed)
Pt husband called to inform that he may bring a form that would need to be completed by Dr [name] re: a migraine medication for his wife , Aimovig.  Pt husband made aware there would be a $50.00 fee for this form to be completed and faxed, he may bring it as early as 05-23-2017

## 2017-05-23 DIAGNOSIS — G43009 Migraine without aura, not intractable, without status migrainosus: Secondary | ICD-10-CM | POA: Diagnosis not present

## 2017-05-23 NOTE — Telephone Encounter (Signed)
Pt's husband called, he made derogatory comments about Dr [name] and her race. This was information was given to management.

## 2017-05-23 NOTE — Telephone Encounter (Signed)
I called the patient. Today her husband spoke to one of our phone operators and made derogatory comments regarding Dr. [name] and her race. I spoke to the patient explained that this was unprofessional and these types of comments are not tolerated by our office. Also explained that in order to provide good treatment to the patient their needs to be good patient physician relationship. Because of his comments this have been damaged. Dr. [name] has requested that the patient be discharged from our practice. The patient reports that she agrees with this plan and has already proceeded to find neurological care at another facility.

## 2017-05-23 NOTE — Telephone Encounter (Signed)
Pt's husband called back again wanting to know if the form was going to be faxed. I advised him, her next provider would make that decision. He said he was coming tomorrow to pick up the form and talk with management

## 2017-05-23 NOTE — Telephone Encounter (Signed)
Pt's husband called request to speak with Dr [name].

## 2017-05-28 ENCOUNTER — Encounter: Payer: Self-pay | Admitting: Neurology

## 2017-05-30 DIAGNOSIS — G43009 Migraine without aura, not intractable, without status migrainosus: Secondary | ICD-10-CM | POA: Diagnosis not present

## 2017-06-06 DIAGNOSIS — H25042 Posterior subcapsular polar age-related cataract, left eye: Secondary | ICD-10-CM | POA: Diagnosis not present

## 2017-06-06 DIAGNOSIS — H25012 Cortical age-related cataract, left eye: Secondary | ICD-10-CM | POA: Diagnosis not present

## 2017-06-06 DIAGNOSIS — H25812 Combined forms of age-related cataract, left eye: Secondary | ICD-10-CM | POA: Diagnosis not present

## 2017-06-06 DIAGNOSIS — H2512 Age-related nuclear cataract, left eye: Secondary | ICD-10-CM | POA: Diagnosis not present

## 2017-06-13 DIAGNOSIS — R350 Frequency of micturition: Secondary | ICD-10-CM | POA: Diagnosis not present

## 2017-06-13 DIAGNOSIS — N3941 Urge incontinence: Secondary | ICD-10-CM | POA: Diagnosis not present

## 2017-06-27 ENCOUNTER — Encounter: Payer: Self-pay | Admitting: Podiatry

## 2017-06-27 ENCOUNTER — Ambulatory Visit (INDEPENDENT_AMBULATORY_CARE_PROVIDER_SITE_OTHER): Payer: Medicare Other | Admitting: Podiatry

## 2017-06-27 DIAGNOSIS — L600 Ingrowing nail: Secondary | ICD-10-CM | POA: Diagnosis not present

## 2017-06-28 NOTE — Progress Notes (Signed)
Subjective:    Patient ID: [name], female   DOB: 66 y.o.   MRN: [id number]   HPI patient presents stating that the right big toenail has been ingrown in the corner and making it hard to wear shoe gear comfortably    ROS      Objective:  Physical Exam neurovascular status intact negative Homans sign was noted with patient's right hallux found to have incurvated border that's painful when pressed and makes shoe gear difficult. Patient states the nail itself has been damaged and painful     Assessment:    Chronic ingrown toenail deformity right hallux with damage nailplate   Plan:    Reviewed procedure and treatment options and at this point it would probably be best to remove and I explained procedure and risk and patient wants this done. I've tried 60 g like Marcaine mixture remove nail exposed matrix applying phenol 3 applications 30 seconds followed by alcohol lavaged sterile dressing and instructed on soaks. Reappoint to recheck

## 2017-07-03 DIAGNOSIS — N3944 Nocturnal enuresis: Secondary | ICD-10-CM | POA: Diagnosis not present

## 2017-07-03 DIAGNOSIS — N3941 Urge incontinence: Secondary | ICD-10-CM | POA: Diagnosis not present

## 2017-07-10 DIAGNOSIS — I129 Hypertensive chronic kidney disease with stage 1 through stage 4 chronic kidney disease, or unspecified chronic kidney disease: Secondary | ICD-10-CM | POA: Diagnosis not present

## 2017-07-10 DIAGNOSIS — Z886 Allergy status to analgesic agent status: Secondary | ICD-10-CM | POA: Diagnosis not present

## 2017-07-10 DIAGNOSIS — R51 Headache: Secondary | ICD-10-CM | POA: Diagnosis not present

## 2017-07-10 DIAGNOSIS — Z86011 Personal history of benign neoplasm of the brain: Secondary | ICD-10-CM | POA: Diagnosis not present

## 2017-07-10 DIAGNOSIS — E1122 Type 2 diabetes mellitus with diabetic chronic kidney disease: Secondary | ICD-10-CM | POA: Diagnosis not present

## 2017-07-10 DIAGNOSIS — N189 Chronic kidney disease, unspecified: Secondary | ICD-10-CM | POA: Diagnosis not present

## 2017-07-10 DIAGNOSIS — G44051 Short lasting unilateral neuralgiform headache with conjunctival injection and tearing (SUNCT), intractable: Secondary | ICD-10-CM | POA: Diagnosis not present

## 2017-07-11 DIAGNOSIS — R51 Headache: Secondary | ICD-10-CM | POA: Diagnosis not present

## 2017-07-11 DIAGNOSIS — F329 Major depressive disorder, single episode, unspecified: Secondary | ICD-10-CM | POA: Diagnosis not present

## 2017-07-12 ENCOUNTER — Ambulatory Visit (INDEPENDENT_AMBULATORY_CARE_PROVIDER_SITE_OTHER): Payer: Medicare Other | Admitting: Orthopaedic Surgery

## 2017-07-29 ENCOUNTER — Encounter (INDEPENDENT_AMBULATORY_CARE_PROVIDER_SITE_OTHER): Payer: Self-pay | Admitting: Orthopaedic Surgery

## 2017-07-29 ENCOUNTER — Ambulatory Visit (INDEPENDENT_AMBULATORY_CARE_PROVIDER_SITE_OTHER): Payer: Medicare Other | Admitting: Orthopaedic Surgery

## 2017-07-29 ENCOUNTER — Ambulatory Visit (INDEPENDENT_AMBULATORY_CARE_PROVIDER_SITE_OTHER): Payer: Medicare Other

## 2017-07-29 DIAGNOSIS — M79674 Pain in right toe(s): Secondary | ICD-10-CM

## 2017-07-29 NOTE — Progress Notes (Signed)
Office Visit Note   Patient: [name]           Date of Birth: 1951-01-31           MRN: [id number] Visit Date: 07/29/2017              Requested by: Carol Ada, MD [address], [place] [address] PCP: Carol Ada, MD   Assessment & Plan: Visit Diagnoses:  1. Toe pain, right     Plan: recommend custom orthotic for her lateral column foot pain. Prescription to [facility] was provided today.questions encouraged and answered. Follow-up as needed.  Follow-Up Instructions: Return if symptoms worsen or fail to improve.   Orders:  Orders Placed This Encounter  Procedures  . XR Foot Complete Right   No orders of the defined types were placed in this encounter.     Procedures: No procedures performed   Clinical Data: No additional findings.   Subjective: Chief Complaint  Patient presents with  . Right Foot - Pain    Patient is a 66 year old female with lateral column foot pain for quite some time. She denies any injuries. Denies any numbness and tingling. She denies swelling. The pain comes and goes. Closed shoes make it worse.      Review of Systems  Constitutional: Negative.   HENT: Negative.   Eyes: Negative.   Respiratory: Negative.   Cardiovascular: Negative.   Endocrine: Negative.   Musculoskeletal: Negative.   Neurological: Negative.   Hematological: Negative.   Psychiatric/Behavioral: Negative.   All other systems reviewed and are negative.    Objective: Vital Signs: There were no vitals taken for this visit.  Physical Exam  Constitutional: She is oriented to person, place, and time. She appears well-developed and well-nourished.  HENT:  Head: Normocephalic and atraumatic.  Eyes: EOM are normal.  Neck: Neck supple.  Pulmonary/Chest: Effort normal.  Abdominal: Soft.  Neurological: She is alert and oriented to person, place, and time.  Skin: Skin is warm. Capillary refill takes less than 2 seconds.  Psychiatric:  She has a normal mood and affect. Her behavior is normal. Judgment and thought content normal.  Nursing note and vitals reviewed.   Ortho Exam Right foot exam shows callus of the great toe. She has pain with palpation along the fifth ray. She has no skin changes or swelling. No worrisome findings. Specialty Comments:  No specialty comments available.  Imaging: Xr Foot Complete Right  Result Date: 07/29/2017 No acute findings    PMFS History: Patient Active Problem List   Diagnosis Date Noted  . Polypharmacy 05/15/2017  . Meningioma ([place]) 01/09/2016  . Depression 11/08/2015  . Abnormal head movements 11/08/2015  . Somnolence, daytime 07/14/2015  . Incisional hernia 06/16/2015  . Chronic headaches 04/01/2013   Past Medical History:  Diagnosis Date  . Depression   . Epilepsy, grand mal ([place])    "last seizure ~ 2001" (06/16/2015)  . GERD (gastroesophageal reflux disease)   . High cholesterol   . Hypertension   . Migraine    "just about qd";  treated by Dr. [name] (10/15/2016)  . Pre-diabetes    she reports that she never took meds. for prediabetes, states MD has told her that she has corrected her situation with diet   . Seizures ([place])    "at one time; stopped in the 1990s" (10/15/2016)  . Stress at home    Pt. reports that she has a lot of personal stress & she is aware  that it might being playing a part in her Migraine heaaches      Family History  Problem Relation Age of Onset  . Lung cancer Mother   . Kidney failure Father   . Hypertension Other     Past Surgical History:  Procedure Laterality Date  . HERNIA REPAIR    . INCISIONAL HERNIA REPAIR N/A 06/16/2015   Procedure: LAPAROSCOPIC INCISIONAL HERNIA WITH MESH;  Surgeon: Coralie Keens, MD;  Location: [place];  Service: General;  Laterality: N/A;  . [place]  10/15/2016  . INCISIONAL HERNIA REPAIR N/A 10/15/2016   Procedure: OPEN INCISIONAL HERNIA REPAIR WITH MESH;  Surgeon: Coralie Keens, MD;   Location: [place];  Service: General;  Laterality: N/A;  . INSERTION OF MESH N/A 06/16/2015   Procedure: INSERTION OF MESH;  Surgeon: Coralie Keens, MD;  Location: [place];  Service: General;  Laterality: N/A;  . VAGINAL HYSTERECTOMY  ~ 1992   Social History   Occupational History  .      does not work   Social History Main Topics  . Smoking status: Never Smoker  . Smokeless tobacco: Never Used  . Alcohol use No  . Drug use: No  . Sexual activity: Yes

## 2017-07-30 DIAGNOSIS — H43812 Vitreous degeneration, left eye: Secondary | ICD-10-CM | POA: Diagnosis not present

## 2017-07-31 DIAGNOSIS — M542 Cervicalgia: Secondary | ICD-10-CM | POA: Diagnosis not present

## 2017-07-31 DIAGNOSIS — M546 Pain in thoracic spine: Secondary | ICD-10-CM | POA: Diagnosis not present

## 2017-07-31 DIAGNOSIS — M9902 Segmental and somatic dysfunction of thoracic region: Secondary | ICD-10-CM | POA: Diagnosis not present

## 2017-07-31 DIAGNOSIS — G44209 Tension-type headache, unspecified, not intractable: Secondary | ICD-10-CM | POA: Diagnosis not present

## 2017-07-31 DIAGNOSIS — M9901 Segmental and somatic dysfunction of cervical region: Secondary | ICD-10-CM | POA: Diagnosis not present

## 2017-08-05 DIAGNOSIS — M9902 Segmental and somatic dysfunction of thoracic region: Secondary | ICD-10-CM | POA: Diagnosis not present

## 2017-08-05 DIAGNOSIS — G44209 Tension-type headache, unspecified, not intractable: Secondary | ICD-10-CM | POA: Diagnosis not present

## 2017-08-05 DIAGNOSIS — M546 Pain in thoracic spine: Secondary | ICD-10-CM | POA: Diagnosis not present

## 2017-08-05 DIAGNOSIS — M9901 Segmental and somatic dysfunction of cervical region: Secondary | ICD-10-CM | POA: Diagnosis not present

## 2017-08-05 DIAGNOSIS — M542 Cervicalgia: Secondary | ICD-10-CM | POA: Diagnosis not present

## 2017-08-06 ENCOUNTER — Other Ambulatory Visit: Payer: Self-pay | Admitting: Family Medicine

## 2017-08-06 DIAGNOSIS — Z1231 Encounter for screening mammogram for malignant neoplasm of breast: Secondary | ICD-10-CM

## 2017-08-14 ENCOUNTER — Ambulatory Visit: Payer: Medicare Other

## 2017-08-20 ENCOUNTER — Ambulatory Visit
Admission: RE | Admit: 2017-08-20 | Discharge: 2017-08-20 | Disposition: A | Discharge status: Home / Self Care | Payer: Medicare Other | Source: Ambulatory Visit | Attending: Family Medicine | Admitting: Family Medicine

## 2017-08-20 DIAGNOSIS — Z1231 Encounter for screening mammogram for malignant neoplasm of breast: Secondary | ICD-10-CM

## 2017-08-27 DIAGNOSIS — Z01411 Encounter for gynecological examination (general) (routine) with abnormal findings: Secondary | ICD-10-CM | POA: Diagnosis not present

## 2017-08-27 DIAGNOSIS — R350 Frequency of micturition: Secondary | ICD-10-CM | POA: Diagnosis not present

## 2017-08-27 DIAGNOSIS — M8589 Other specified disorders of bone density and structure, multiple sites: Secondary | ICD-10-CM | POA: Diagnosis not present

## 2017-08-27 DIAGNOSIS — N939 Abnormal uterine and vaginal bleeding, unspecified: Secondary | ICD-10-CM | POA: Diagnosis not present

## 2017-08-27 DIAGNOSIS — N952 Postmenopausal atrophic vaginitis: Secondary | ICD-10-CM | POA: Diagnosis not present

## 2017-09-03 DIAGNOSIS — H43812 Vitreous degeneration, left eye: Secondary | ICD-10-CM | POA: Diagnosis not present

## 2017-09-09 DIAGNOSIS — M546 Pain in thoracic spine: Secondary | ICD-10-CM | POA: Diagnosis not present

## 2017-09-09 DIAGNOSIS — G44209 Tension-type headache, unspecified, not intractable: Secondary | ICD-10-CM | POA: Diagnosis not present

## 2017-09-09 DIAGNOSIS — M9901 Segmental and somatic dysfunction of cervical region: Secondary | ICD-10-CM | POA: Diagnosis not present

## 2017-09-09 DIAGNOSIS — M542 Cervicalgia: Secondary | ICD-10-CM | POA: Diagnosis not present

## 2017-09-09 DIAGNOSIS — M9902 Segmental and somatic dysfunction of thoracic region: Secondary | ICD-10-CM | POA: Diagnosis not present

## 2017-09-11 DIAGNOSIS — G44209 Tension-type headache, unspecified, not intractable: Secondary | ICD-10-CM | POA: Diagnosis not present

## 2017-09-11 DIAGNOSIS — M9901 Segmental and somatic dysfunction of cervical region: Secondary | ICD-10-CM | POA: Diagnosis not present

## 2017-09-11 DIAGNOSIS — M546 Pain in thoracic spine: Secondary | ICD-10-CM | POA: Diagnosis not present

## 2017-09-11 DIAGNOSIS — M9902 Segmental and somatic dysfunction of thoracic region: Secondary | ICD-10-CM | POA: Diagnosis not present

## 2017-09-11 DIAGNOSIS — M542 Cervicalgia: Secondary | ICD-10-CM | POA: Diagnosis not present

## 2017-09-12 DIAGNOSIS — G44209 Tension-type headache, unspecified, not intractable: Secondary | ICD-10-CM | POA: Diagnosis not present

## 2017-09-12 DIAGNOSIS — M546 Pain in thoracic spine: Secondary | ICD-10-CM | POA: Diagnosis not present

## 2017-09-12 DIAGNOSIS — M542 Cervicalgia: Secondary | ICD-10-CM | POA: Diagnosis not present

## 2017-09-12 DIAGNOSIS — M9901 Segmental and somatic dysfunction of cervical region: Secondary | ICD-10-CM | POA: Diagnosis not present

## 2017-09-12 DIAGNOSIS — M9902 Segmental and somatic dysfunction of thoracic region: Secondary | ICD-10-CM | POA: Diagnosis not present

## 2017-09-16 DIAGNOSIS — G44209 Tension-type headache, unspecified, not intractable: Secondary | ICD-10-CM | POA: Diagnosis not present

## 2017-09-16 DIAGNOSIS — M542 Cervicalgia: Secondary | ICD-10-CM | POA: Diagnosis not present

## 2017-09-16 DIAGNOSIS — M9901 Segmental and somatic dysfunction of cervical region: Secondary | ICD-10-CM | POA: Diagnosis not present

## 2017-09-16 DIAGNOSIS — M9902 Segmental and somatic dysfunction of thoracic region: Secondary | ICD-10-CM | POA: Diagnosis not present

## 2017-09-16 DIAGNOSIS — M546 Pain in thoracic spine: Secondary | ICD-10-CM | POA: Diagnosis not present

## 2017-09-17 DIAGNOSIS — M9901 Segmental and somatic dysfunction of cervical region: Secondary | ICD-10-CM | POA: Diagnosis not present

## 2017-09-17 DIAGNOSIS — G44209 Tension-type headache, unspecified, not intractable: Secondary | ICD-10-CM | POA: Diagnosis not present

## 2017-09-17 DIAGNOSIS — M9902 Segmental and somatic dysfunction of thoracic region: Secondary | ICD-10-CM | POA: Diagnosis not present

## 2017-09-17 DIAGNOSIS — M546 Pain in thoracic spine: Secondary | ICD-10-CM | POA: Diagnosis not present

## 2017-09-17 DIAGNOSIS — M542 Cervicalgia: Secondary | ICD-10-CM | POA: Diagnosis not present

## 2017-09-18 DIAGNOSIS — G44209 Tension-type headache, unspecified, not intractable: Secondary | ICD-10-CM | POA: Diagnosis not present

## 2017-09-18 DIAGNOSIS — M542 Cervicalgia: Secondary | ICD-10-CM | POA: Diagnosis not present

## 2017-09-18 DIAGNOSIS — M9902 Segmental and somatic dysfunction of thoracic region: Secondary | ICD-10-CM | POA: Diagnosis not present

## 2017-09-18 DIAGNOSIS — M9901 Segmental and somatic dysfunction of cervical region: Secondary | ICD-10-CM | POA: Diagnosis not present

## 2017-09-18 DIAGNOSIS — M546 Pain in thoracic spine: Secondary | ICD-10-CM | POA: Diagnosis not present

## 2017-09-23 DIAGNOSIS — G44209 Tension-type headache, unspecified, not intractable: Secondary | ICD-10-CM | POA: Diagnosis not present

## 2017-09-23 DIAGNOSIS — M9902 Segmental and somatic dysfunction of thoracic region: Secondary | ICD-10-CM | POA: Diagnosis not present

## 2017-09-23 DIAGNOSIS — M546 Pain in thoracic spine: Secondary | ICD-10-CM | POA: Diagnosis not present

## 2017-09-23 DIAGNOSIS — M542 Cervicalgia: Secondary | ICD-10-CM | POA: Diagnosis not present

## 2017-09-23 DIAGNOSIS — M9901 Segmental and somatic dysfunction of cervical region: Secondary | ICD-10-CM | POA: Diagnosis not present

## 2017-09-24 DIAGNOSIS — G43019 Migraine without aura, intractable, without status migrainosus: Secondary | ICD-10-CM | POA: Diagnosis not present

## 2017-09-25 DIAGNOSIS — M546 Pain in thoracic spine: Secondary | ICD-10-CM | POA: Diagnosis not present

## 2017-09-25 DIAGNOSIS — M542 Cervicalgia: Secondary | ICD-10-CM | POA: Diagnosis not present

## 2017-09-25 DIAGNOSIS — M9902 Segmental and somatic dysfunction of thoracic region: Secondary | ICD-10-CM | POA: Diagnosis not present

## 2017-09-25 DIAGNOSIS — M9901 Segmental and somatic dysfunction of cervical region: Secondary | ICD-10-CM | POA: Diagnosis not present

## 2017-09-25 DIAGNOSIS — G44209 Tension-type headache, unspecified, not intractable: Secondary | ICD-10-CM | POA: Diagnosis not present

## 2017-09-26 DIAGNOSIS — M9902 Segmental and somatic dysfunction of thoracic region: Secondary | ICD-10-CM | POA: Diagnosis not present

## 2017-09-26 DIAGNOSIS — M9901 Segmental and somatic dysfunction of cervical region: Secondary | ICD-10-CM | POA: Diagnosis not present

## 2017-09-26 DIAGNOSIS — M542 Cervicalgia: Secondary | ICD-10-CM | POA: Diagnosis not present

## 2017-09-26 DIAGNOSIS — M546 Pain in thoracic spine: Secondary | ICD-10-CM | POA: Diagnosis not present

## 2017-09-26 DIAGNOSIS — N3944 Nocturnal enuresis: Secondary | ICD-10-CM | POA: Diagnosis not present

## 2017-09-26 DIAGNOSIS — G44209 Tension-type headache, unspecified, not intractable: Secondary | ICD-10-CM | POA: Diagnosis not present

## 2017-09-26 DIAGNOSIS — N3941 Urge incontinence: Secondary | ICD-10-CM | POA: Diagnosis not present

## 2017-09-30 DIAGNOSIS — M546 Pain in thoracic spine: Secondary | ICD-10-CM | POA: Diagnosis not present

## 2017-09-30 DIAGNOSIS — M542 Cervicalgia: Secondary | ICD-10-CM | POA: Diagnosis not present

## 2017-09-30 DIAGNOSIS — G44209 Tension-type headache, unspecified, not intractable: Secondary | ICD-10-CM | POA: Diagnosis not present

## 2017-09-30 DIAGNOSIS — M9901 Segmental and somatic dysfunction of cervical region: Secondary | ICD-10-CM | POA: Diagnosis not present

## 2017-09-30 DIAGNOSIS — M9902 Segmental and somatic dysfunction of thoracic region: Secondary | ICD-10-CM | POA: Diagnosis not present

## 2017-10-02 DIAGNOSIS — M9902 Segmental and somatic dysfunction of thoracic region: Secondary | ICD-10-CM | POA: Diagnosis not present

## 2017-10-02 DIAGNOSIS — M546 Pain in thoracic spine: Secondary | ICD-10-CM | POA: Diagnosis not present

## 2017-10-02 DIAGNOSIS — M9901 Segmental and somatic dysfunction of cervical region: Secondary | ICD-10-CM | POA: Diagnosis not present

## 2017-10-02 DIAGNOSIS — M542 Cervicalgia: Secondary | ICD-10-CM | POA: Diagnosis not present

## 2017-10-02 DIAGNOSIS — G44209 Tension-type headache, unspecified, not intractable: Secondary | ICD-10-CM | POA: Diagnosis not present

## 2017-10-08 DIAGNOSIS — N952 Postmenopausal atrophic vaginitis: Secondary | ICD-10-CM | POA: Diagnosis not present

## 2017-10-13 ENCOUNTER — Other Ambulatory Visit: Payer: Self-pay | Admitting: Neurology

## 2017-10-23 DIAGNOSIS — Z886 Allergy status to analgesic agent status: Secondary | ICD-10-CM | POA: Diagnosis not present

## 2017-10-23 DIAGNOSIS — N189 Chronic kidney disease, unspecified: Secondary | ICD-10-CM | POA: Diagnosis not present

## 2017-10-23 DIAGNOSIS — R51 Headache: Secondary | ICD-10-CM | POA: Diagnosis not present

## 2017-10-23 DIAGNOSIS — R110 Nausea: Secondary | ICD-10-CM | POA: Diagnosis not present

## 2017-10-23 DIAGNOSIS — Z79899 Other long term (current) drug therapy: Secondary | ICD-10-CM | POA: Diagnosis not present

## 2017-10-23 DIAGNOSIS — E1122 Type 2 diabetes mellitus with diabetic chronic kidney disease: Secondary | ICD-10-CM | POA: Diagnosis not present

## 2017-10-23 DIAGNOSIS — Z86011 Personal history of benign neoplasm of the brain: Secondary | ICD-10-CM | POA: Diagnosis not present

## 2017-10-23 DIAGNOSIS — I129 Hypertensive chronic kidney disease with stage 1 through stage 4 chronic kidney disease, or unspecified chronic kidney disease: Secondary | ICD-10-CM | POA: Diagnosis not present

## 2017-10-23 DIAGNOSIS — M542 Cervicalgia: Secondary | ICD-10-CM | POA: Diagnosis not present

## 2017-10-23 DIAGNOSIS — Z888 Allergy status to other drugs, medicaments and biological substances status: Secondary | ICD-10-CM | POA: Diagnosis not present

## 2017-10-28 DIAGNOSIS — M542 Cervicalgia: Secondary | ICD-10-CM | POA: Diagnosis not present

## 2017-10-28 DIAGNOSIS — G43009 Migraine without aura, not intractable, without status migrainosus: Secondary | ICD-10-CM | POA: Diagnosis not present

## 2017-10-28 DIAGNOSIS — G894 Chronic pain syndrome: Secondary | ICD-10-CM | POA: Diagnosis not present

## 2017-10-28 DIAGNOSIS — M545 Low back pain: Secondary | ICD-10-CM | POA: Diagnosis not present

## 2017-10-28 DIAGNOSIS — H5710 Ocular pain, unspecified eye: Secondary | ICD-10-CM | POA: Diagnosis not present

## 2017-11-02 DIAGNOSIS — G4700 Insomnia, unspecified: Secondary | ICD-10-CM | POA: Diagnosis not present

## 2017-11-02 DIAGNOSIS — G43909 Migraine, unspecified, not intractable, without status migrainosus: Secondary | ICD-10-CM | POA: Diagnosis not present

## 2017-11-07 ENCOUNTER — Other Ambulatory Visit: Payer: Self-pay

## 2017-11-07 ENCOUNTER — Emergency Department (HOSPITAL_COMMUNITY): Payer: Medicare Other

## 2017-11-07 ENCOUNTER — Encounter (HOSPITAL_COMMUNITY): Payer: Self-pay | Admitting: *Deleted

## 2017-11-07 ENCOUNTER — Inpatient Hospital Stay (HOSPITAL_COMMUNITY)
Admission: EM | Admit: 2017-11-07 | Discharge: 2017-11-11 | DRG: 092 | Disposition: A | Discharge status: Home / Self Care | Payer: Medicare Other | Attending: Internal Medicine | Admitting: Internal Medicine

## 2017-11-07 DIAGNOSIS — G44009 Cluster headache syndrome, unspecified, not intractable: Secondary | ICD-10-CM | POA: Diagnosis not present

## 2017-11-07 DIAGNOSIS — N179 Acute kidney failure, unspecified: Secondary | ICD-10-CM | POA: Diagnosis present

## 2017-11-07 DIAGNOSIS — T50905A Adverse effect of unspecified drugs, medicaments and biological substances, initial encounter: Secondary | ICD-10-CM | POA: Diagnosis present

## 2017-11-07 DIAGNOSIS — R269 Unspecified abnormalities of gait and mobility: Secondary | ICD-10-CM | POA: Diagnosis not present

## 2017-11-07 DIAGNOSIS — G9340 Encephalopathy, unspecified: Secondary | ICD-10-CM | POA: Diagnosis present

## 2017-11-07 DIAGNOSIS — G92 Toxic encephalopathy: Secondary | ICD-10-CM | POA: Diagnosis not present

## 2017-11-07 DIAGNOSIS — R410 Disorientation, unspecified: Secondary | ICD-10-CM | POA: Diagnosis not present

## 2017-11-07 DIAGNOSIS — E785 Hyperlipidemia, unspecified: Secondary | ICD-10-CM | POA: Diagnosis present

## 2017-11-07 DIAGNOSIS — G40909 Epilepsy, unspecified, not intractable, without status epilepticus: Secondary | ICD-10-CM

## 2017-11-07 DIAGNOSIS — R2681 Unsteadiness on feet: Secondary | ICD-10-CM

## 2017-11-07 DIAGNOSIS — F0390 Unspecified dementia without behavioral disturbance: Secondary | ICD-10-CM | POA: Diagnosis present

## 2017-11-07 DIAGNOSIS — Z79899 Other long term (current) drug therapy: Secondary | ICD-10-CM

## 2017-11-07 DIAGNOSIS — I10 Essential (primary) hypertension: Secondary | ICD-10-CM | POA: Diagnosis present

## 2017-11-07 DIAGNOSIS — R4182 Altered mental status, unspecified: Secondary | ICD-10-CM

## 2017-11-07 DIAGNOSIS — R6889 Other general symptoms and signs: Secondary | ICD-10-CM | POA: Diagnosis not present

## 2017-11-07 DIAGNOSIS — R4020 Unspecified coma: Secondary | ICD-10-CM | POA: Diagnosis not present

## 2017-11-07 DIAGNOSIS — R569 Unspecified convulsions: Secondary | ICD-10-CM

## 2017-11-07 HISTORY — DX: Unspecified dementia, unspecified severity, without behavioral disturbance, psychotic disturbance, mood disturbance, and anxiety: F03.90

## 2017-11-07 LAB — COMPREHENSIVE METABOLIC PANEL
ALT: 12 U/L — ABNORMAL LOW (ref 14–54)
AST: 18 U/L (ref 15–41)
Albumin: 3.8 g/dL (ref 3.5–5.0)
Alkaline Phosphatase: 46 U/L (ref 38–126)
Anion gap: 8 (ref 5–15)
BUN: 26 mg/dL — ABNORMAL HIGH (ref 6–20)
CO2: 24 mmol/L (ref 22–32)
Calcium: 9.4 mg/dL (ref 8.9–10.3)
Chloride: 107 mmol/L (ref 101–111)
Creatinine, Ser: 1.31 mg/dL — ABNORMAL HIGH (ref 0.44–1.00)
GFR calc Af Amer: 48 mL/min — ABNORMAL LOW (ref >60)
GFR calc non Af Amer: 41 mL/min — ABNORMAL LOW (ref >60)
Glucose, Bld: 92 mg/dL (ref 65–99)
Potassium: 3.3 mmol/L — ABNORMAL LOW (ref 3.5–5.1)
Sodium: 139 mmol/L (ref 135–145)
Total Bilirubin: 0.4 mg/dL (ref 0.3–1.2)
Total Protein: 6.9 g/dL (ref 6.5–8.1)

## 2017-11-07 LAB — CBG MONITORING, ED
Glucose-Capillary: 61 mg/dL — ABNORMAL LOW (ref 65–99)
Glucose-Capillary: 64 mg/dL — ABNORMAL LOW (ref 65–99)
Glucose-Capillary: 124 mg/dL — ABNORMAL HIGH (ref 65–99)

## 2017-11-07 LAB — URINALYSIS, ROUTINE W REFLEX MICROSCOPIC
Bilirubin Urine: NEGATIVE
Glucose, UA: NEGATIVE mg/dL
Hgb urine dipstick: NEGATIVE
Ketones, ur: NEGATIVE mg/dL
Leukocytes, UA: NEGATIVE
Nitrite: NEGATIVE
Protein, ur: NEGATIVE mg/dL
Specific Gravity, Urine: 1.019 (ref 1.005–1.030)
pH: 7 (ref 5.0–8.0)

## 2017-11-07 LAB — CBC
HCT: 42.8 % (ref 36.0–46.0)
Hemoglobin: 14 g/dL (ref 12.0–15.0)
MCH: 30.9 pg (ref 26.0–34.0)
MCHC: 32.7 g/dL (ref 30.0–36.0)
MCV: 94.5 fL (ref 78.0–100.0)
Platelets: 259 10*3/uL (ref 150–400)
RBC: 4.53 MIL/uL (ref 3.87–5.11)
RDW: 12.7 % (ref 11.5–15.5)
WBC: 11.6 10*3/uL — ABNORMAL HIGH (ref 4.0–10.5)

## 2017-11-07 LAB — VALPROIC ACID LEVEL: Valproic Acid Lvl: 86 ug/mL (ref 50.0–100.0)

## 2017-11-07 LAB — AMMONIA: Ammonia: 59 umol/L — ABNORMAL HIGH (ref 9–35)

## 2017-11-07 MED ORDER — ONDANSETRON HCL 4 MG/2ML IJ SOLN: 4.0000 mg | Freq: Four times a day (QID) | INTRAMUSCULAR | Status: DC | PRN

## 2017-11-07 MED ORDER — ACETAMINOPHEN 650 MG RE SUPP: 650.0000 mg | Freq: Four times a day (QID) | RECTAL | Status: DC | PRN

## 2017-11-07 MED ORDER — HYDROCODONE-ACETAMINOPHEN 5-325 MG PO TABS
1.0000 | ORAL_TABLET | Freq: Four times a day (QID) | ORAL | Status: DC | PRN
  Administered 2017-11-08 – 2017-11-10 (×3): 1 via ORAL
  Filled 2017-11-07 (×3): qty 1

## 2017-11-07 MED ORDER — ATORVASTATIN CALCIUM 40 MG PO TABS
40.0000 mg | ORAL_TABLET | Freq: Every day | ORAL | Status: DC
  Administered 2017-11-08 – 2017-11-11 (×4): 40 mg via ORAL
  Filled 2017-11-07 (×4): qty 1

## 2017-11-07 MED ORDER — DIVALPROEX SODIUM ER 500 MG PO TB24
500.0000 mg | ORAL_TABLET | Freq: Every day | ORAL | Status: DC
  Administered 2017-11-08 – 2017-11-11 (×4): 500 mg via ORAL
  Filled 2017-11-07 (×4): qty 1

## 2017-11-07 MED ORDER — TOPIRAMATE 100 MG PO TABS
100.0000 mg | ORAL_TABLET | Freq: Two times a day (BID) | ORAL | Status: DC
  Administered 2017-11-07 – 2017-11-11 (×8): 100 mg via ORAL
  Filled 2017-11-07 (×11): qty 1

## 2017-11-07 MED ORDER — PANTOPRAZOLE SODIUM 40 MG PO TBEC
40.0000 mg | DELAYED_RELEASE_TABLET | Freq: Every day | ORAL | Status: DC
  Administered 2017-11-08 – 2017-11-11 (×4): 40 mg via ORAL
  Filled 2017-11-07 (×4): qty 1

## 2017-11-07 MED ORDER — AMLODIPINE BESYLATE 5 MG PO TABS
5.0000 mg | ORAL_TABLET | Freq: Every day | ORAL | Status: DC
Start: 2017-11-08 — End: 2017-11-11
  Administered 2017-11-08 – 2017-11-11 (×4): 5 mg via ORAL
  Filled 2017-11-07 (×4): qty 1

## 2017-11-07 MED ORDER — LORAZEPAM 2 MG/ML IJ SOLN
0.5000 mg | Freq: Once | INTRAMUSCULAR | Status: AC
  Administered 2017-11-07: 0.5 mg via INTRAVENOUS
  Filled 2017-11-07: qty 1

## 2017-11-07 MED ORDER — DIVALPROEX SODIUM ER 500 MG PO TB24
1000.0000 mg | ORAL_TABLET | Freq: Every day | ORAL | Status: DC
  Administered 2017-11-07 – 2017-11-10 (×4): 1000 mg via ORAL
  Filled 2017-11-07 (×4): qty 2

## 2017-11-07 MED ORDER — ONDANSETRON HCL 4 MG PO TABS: 4.0000 mg | ORAL_TABLET | Freq: Four times a day (QID) | ORAL | Status: DC | PRN

## 2017-11-07 MED ORDER — ENOXAPARIN SODIUM 40 MG/0.4ML ~~LOC~~ SOLN
40.0000 mg | SUBCUTANEOUS | Status: DC
  Administered 2017-11-08 – 2017-11-11 (×4): 40 mg via SUBCUTANEOUS
  Filled 2017-11-07 (×4): qty 0.4

## 2017-11-07 MED ORDER — FESOTERODINE FUMARATE ER 8 MG PO TB24
8.0000 mg | ORAL_TABLET | Freq: Every day | ORAL | Status: DC
  Administered 2017-11-07 – 2017-11-10 (×4): 8 mg via ORAL
  Filled 2017-11-07 (×4): qty 1

## 2017-11-07 MED ORDER — LACTATED RINGERS IV SOLN
INTRAVENOUS | Status: AC
  Administered 2017-11-07: via INTRAVENOUS

## 2017-11-07 MED ORDER — DOCUSATE SODIUM 100 MG PO CAPS
100.0000 mg | ORAL_CAPSULE | Freq: Two times a day (BID) | ORAL | Status: DC
  Administered 2017-11-07 – 2017-11-11 (×8): 100 mg via ORAL
  Filled 2017-11-07 (×9): qty 1

## 2017-11-07 MED ORDER — POTASSIUM CHLORIDE CRYS ER 20 MEQ PO TBCR
40.0000 meq | EXTENDED_RELEASE_TABLET | Freq: Once | ORAL | Status: AC
  Administered 2017-11-07: 40 meq via ORAL
  Filled 2017-11-07: qty 2

## 2017-11-07 MED ORDER — ACETAMINOPHEN 325 MG PO TABS
650.0000 mg | ORAL_TABLET | Freq: Four times a day (QID) | ORAL | Status: DC | PRN
  Administered 2017-11-08 – 2017-11-11 (×4): 650 mg via ORAL
  Filled 2017-11-07 (×4): qty 2

## 2017-11-07 NOTE — ED Notes (Signed)
ED Provider at bedside. 

## 2017-11-07 NOTE — H&P (Addendum)
History and Physical    [name] BWG:[id number] DOB: [birth year] DOA: 11/07/2017  PCP: Carol Ada, MD Consultants:  Larose Kells - neurology Patient coming from: Home - lives with husband and son; NOK: husband and son, [phone]  Chief Complaint: AMS  HPI: [name] is a 66 y.o. female with medical history significant of remote seizures; pre-diabetes; migraines; HTN; HLD; and mild dementia (suspected) presenting with AMS.  She reports that she "took some medication and it um, made me, um, made me um, made me a little wobbly".  She is accompanied by her son and husband and the group are not very good historians.  Her husband reports a number of recent new medications and they think this might be the problem.  While I was initially told her new medication was Trazodone, a note from her neurologist dated 10/31 indicates that she was started on Tegretol (and Trazodone is not listed on her medication list).  Her husband reports that she started the medication a week ago - she took it Monday, Tuesday of this week but did not take it last night.  Sunday she was walking okay.  Monday, she was sort of ok, able to walk.  Tuesday morning, she could hardly get around, needed the walker.  She has never taken this medication before.  They are uncertain about the dose.  No other recent changes.  She had a Butrane patch on, but she has had that before; he picked it up last week on 11/8 and put it on that day.  No infectious symptoms.  She was also taking prednisone as of 11/5 for "migraine."  No dysphagia or dysarthria.  She does have occasional word searching difficulties at baseline.  No focal neurologic issues, just generalized weakness.   ED Course: AMS, confusion, abnormal gait x 2 days.  Relatively normal evaluation in the ER.  Unable to walk in the ER.  Negative head CT.  Review of Systems: As per HPI; otherwise review of systems reviewed and negative.   Ambulatory Status:  Ambulates without  assistance  Past Medical History:  Diagnosis Date  . Dementia   . Depression   . Epilepsy, grand mal ([place])    "last seizure ~ 2001" (06/16/2015)  . GERD (gastroesophageal reflux disease)   . High cholesterol   . Hypertension   . Migraine    "just about qd";  treated by Dr. [name] (10/15/2016)  . Pre-diabetes    she reports that she never took meds. for prediabetes, states MD has told her that she has corrected her situation with diet   . Seizures ([place])    "at one time; stopped in the 1990s" (10/15/2016)  . Stress at home    Pt. reports that she has a lot of personal stress & she is aware that it might being playing a part in her Migraine heaaches      Past Surgical History:  Procedure Laterality Date  . HERNIA REPAIR    . INCISIONAL HERNIA REPAIR N/A 06/16/2015   Procedure: LAPAROSCOPIC INCISIONAL HERNIA WITH MESH;  Surgeon: Coralie Keens, MD;  Location: [facility];  Service: General;  Laterality: N/A;  . [place]  10/15/2016  . INCISIONAL HERNIA REPAIR N/A 10/15/2016   Procedure: OPEN INCISIONAL HERNIA REPAIR WITH MESH;  Surgeon: Coralie Keens, MD;  Location: [facility];  Service: General;  Laterality: N/A;  . INSERTION OF MESH N/A 06/16/2015   Procedure: INSERTION OF MESH;  Surgeon: Coralie Keens, MD;  Location: [place];  Service: General;  Laterality: N/A;  . VAGINAL HYSTERECTOMY  ~ 1992    Social History   Socioeconomic History  . Marital status: Married    Spouse name: Elenore Rota  . Number of children: 3  . Years of education: college  . Highest education level: Not on file  Social Needs  . Financial resource strain: Not on file  . Food insecurity - worry: Not on file  . Food insecurity - inability: Not on file  . Transportation needs - medical: Not on file  . Transportation needs - non-medical: Not on file  Occupational History    Comment: does not work  Tobacco Use  . Smoking status: Never Smoker  . Smokeless tobacco: Never Used  Substance and Sexual  Activity  . Alcohol use: No    Alcohol/week: 0.0 oz  . Drug use: No  . Sexual activity: Yes  Other Topics Concern  . Not on file  Social History Narrative   Patient lives at home with her husband Elenore Rota). Patient was a homemaker.   Right handed.   College education.   Caffeine- One cup of coffee daily.   Patient has three children.    Allergies  Allergen Reactions  . Nsaids Other (See Comments)    Can cause ulcers  . Aspirin Nausea Only and Other (See Comments)    Could cause ulcers   . Tolmetin Other (See Comments)    Can cause ulcers    Family History  Problem Relation Age of Onset  . Lung cancer Mother   . Kidney failure Father   . Hypertension Other     Prior to Admission medications   Medication Sig Start Date End Date Taking? Authorizing Provider  acetaminophen (TYLENOL) 500 MG tablet Take 1,000 mg by mouth every 6 (six) hours as needed for mild pain.   Yes [provider]  amLODipine (NORVASC) 5 MG tablet Take 5 mg by mouth daily after breakfast.    Yes [provider]  atorvastatin (LIPITOR) 40 MG tablet Take 40 mg by mouth daily after breakfast.    Yes [provider]  BUTRANS 7.5 MCG/HR PTWK Place 7.5 mcg every 7 (seven) days onto the skin. 10/31/17  Yes [provider]  Calcium Carb-Cholecalciferol (CALCIUM 600 + D) 600-200 MG-UNIT TABS Take 1 tablet by mouth daily.   Yes [provider]  carbamazepine (TEGRETOL XR) 200 MG 12 hr tablet Take 200 mg at bedtime by mouth. 10/23/17  Yes [provider]  conjugated estrogens (PREMARIN) vaginal cream Place 1.5 g daily vaginally. 08/27/17  Yes [provider]  divalproex (DEPAKOTE ER) 500 MG 24 hr tablet TAKE 1 TABLET BY MOUTH EVERY MORNING AND TAKE 2 TABLETS BY MOUTH EVERY EVENING Patient taking differently: TAKE 500 mg TABLET BY MOUTH EVERY MORNING AND TAKE 1000 mg  TABLETS BY MOUTH EVERY EVENING 10/11/16  Yes Marcial Pacas, MD  esomeprazole (NEXIUM) 40 MG  capsule Take 40 mg by mouth daily after breakfast.  07/13/15  Yes [provider]  famotidine (PEPCID) 20 MG tablet Take 20 mg daily by mouth. 10/17/17  Yes [provider]  ferrous sulfate 325 (65 FE) MG tablet Take 325 mg by mouth daily with breakfast.   Yes [provider]  magnesium oxide (MAG-OX) 400 MG tablet Take 400 mg by mouth 2 (two) times daily.    Yes [provider]  Multiple Vitamin (MULTIVITAMIN WITH MINERALS) TABS Take 1 tablet by mouth daily.   Yes [provider]  [facility] An  50 MG TB24 tablet Take 50 mg daily by mouth. 09/23/17  Yes [provider]  olmesartan (BENICAR) 40 MG tablet Take 40 mg by mouth daily after breakfast.    Yes [provider]  tiZANidine (ZANAFLEX) 2 MG tablet TAKE 1 TABLET BY MOUTH TWICE A DAY Patient taking differently: TAKE 2 mg  TABLET BY MOUTH TWICE A DAY 04/08/17  Yes Marcial Pacas, MD  topiramate (TOPAMAX) 100 MG tablet Take 100 mg 2 (two) times daily by mouth. 07/08/14  Yes [provider]  TOVIAZ 8 MG TB24 tablet Take 8 mg by mouth at bedtime.  10/02/16  Yes [provider]  traMADol (ULTRAM) 50 MG tablet Take 1 tablet (50 mg total) by mouth every 6 (six) hours as needed. 05/15/17  Yes Marcial Pacas, MD  vitamin B-12 (CYANOCOBALAMIN) 100 MCG tablet Take 1,000 mcg daily by mouth.   Yes [provider]  ondansetron (ZOFRAN) 4 MG tablet Take 1 tablet (4 mg total) by mouth every 8 (eight) hours as needed for nausea or vomiting. Patient not taking: Reported on 11/07/2017 11/08/15   Dennie Bible, NP  oxyCODONE (OXY IR/ROXICODONE) 5 MG immediate release tablet Take 1-2 tablets (5-10 mg total) by mouth every 4 (four) hours as needed for moderate pain. Patient not taking: Reported on 11/07/2017 10/16/16   Coralie Keens, MD    Physical Exam: Vitals:   11/07/17 1900 11/07/17 1930 11/07/17 2000 11/07/17 2145  BP: 139/63 (!) 142/63 140/62 (!) 151/64  Pulse: 75 75 75 98   Resp: 12 (!) 8 (!) 9 13  Temp:      TempSrc:      SpO2: 100% 100% 100% 100%     General: Somewhat somnolent vs. Catatonic - she sat perfectly still with a fork in her hand and dinner in front of her for many minutes until her son finally prompted her to eat.  Very slow responses, somewhat inappropriate. Eyes:  PERRL, EOMI, normal lids, iris ENT:  grossly normal hearing, lips & tongue, mmm Neck:  no LAD, masses or thyromegaly; no carotid bruits Cardiovascular:  RRR, no m/r/g. No LE edema.  Respiratory:   CTA bilaterally with no wheezes/rales/rhonchi.  Normal respiratory effort. Abdomen:  soft, NT, ND, NABS Skin:  no rash or induration seen on limited exam Musculoskeletal:  grossly normal tone BUE/BLE, good ROM, no bony abnormality Psychiatric: as above, eccentric mood and affect, speech slow and intermittently appropriate vs. Out of context, oriented to self Neurologic:  CN 2-12 grossly intact, moves all extremities in coordinated fashion, sensation intact.  Possibly some right LE neglect.    Radiological Exams on Admission: Ct Head Wo Contrast  Result Date: 11/07/2017 CLINICAL DATA:  Altered level of consciousness EXAM: CT HEAD WITHOUT CONTRAST TECHNIQUE: Contiguous axial images were obtained from the base of the skull through the vertex without intravenous contrast. COMPARISON:  12/21/2015, 12/25/2013 FINDINGS: Brain: No acute territorial infarction, hemorrhage or intracranial mass is visualized. Mild atrophy. Mildly prominent ventricles likely due to atrophy. Vascular: No hyperdense vessels.  Carotid vessel calcification Skull: Normal. Negative for fracture or focal lesion. Sinuses/Orbits: No acute finding. Other: None IMPRESSION: No CT evidence for acute intracranial abnormality. Electronically Signed   By: Donavan Foil M.D.   On: 11/07/2017 16:28    EKG: pending   Labs on Admission: I have personally reviewed the available labs and imaging studies at the time of the  admission.  Pertinent labs:   UA unremarkable Glucose 61, 64, 124 Ammonia 59 Valproic acid  86 BUN 26/Creatinine 1.31/GFR 48; 18/1/>60 in 2/18 WBC 11.6   Assessment/Plan Principal Problem:   Acute encephalopathy Active Problems:   Polypharmacy   Seizure disorder (HCC)   Dementia   Essential hypertension   Headaches, cluster   AKI (acute kidney injury) ([place])   Acute encephalopathy -Patient presenting with acute/subacute encephalopathy -Most likely etiology appears to be polypharmacy - specifically involving new medications, although she is on a multitude of meds that could contribute (see below) -Slowly evolving dementia, which the family thinks she has, would make her particularly sensitive to delirium -She does have a h/o seizure d/o but it is remote and probably unrelated (see below) -Negative head CT but with her right-sided neglect and altered sensorium, CVA remains a less likely possibility; will order MRI to effectively r/o; will monitor on telemetry until MRI is negative.  Unfortunately, she is very MRI phobic and so insists that she will need Ativan in order to complete it; will give low dose with the understanding that this is likely to further complicate her AMS picture. -Will hold several sedating medications and follow -Observe, likely 12-48 hours to allow medications to get out of her system  Polypharmacy Medications of possible concern include: -Lipitor - can contribute to memory loss and if the patient has underlying dementia this may be accentuated -Butrans (buprenorphine) - will hold -Tegretol - this was recently started by her neurologist (10/31) for cluster headaches.  AMS and sedation may result from this medication.  Will hold. -Premarin - increases risk of stroke.  Will hold. -Both Myrbetriq and Toviaz may compound anticholinergic effects.  Will hold one and continue the other. -Zanaflex - will hold -Topamax - may contribute to sedation but will continue  for now -Ultram - may contribute to sedation and lowers the seizure threshold.  Will hold. -As noted above, Trazodone is not listed on her medication list but this would certainly also increase her sedation risk and possibly lead to confusion. -Will give Vicodin prn severe pain.  AKI -Mild, may be baseline for her -If present, this could also be a contributing factor to her AMS -Will give LR at 100 cc/hr x 10 hours -Hold Benicar and attempt to avoid nephrotoxic medications  Seizure d/o -Remote h/o seizures but she has continued taking Depakote -Level is appropriate -Will continue for now -Tegretol appears to have been added recently and not for seizures; will not continue this medication -Ultram decreases the seizure threshold and should not be used in patients with h/o seizures  Cluster headaches -Could try high-flow oxygen -Diagnosed by neurology -Generally narcotics are not recommended for either migraines or cluster headaches -Will give Vicodin prn and follow  Dementia -Evaluation today indicates possible dementia -Family concurs that they are concerned about this -Suggest outpatient consideration of this issue  HTN -Continue Norvac -Hold Benicar due to AKI   DVT prophylaxis:  Lovenox Code Status:  Full - confirmed with patient/family Family Communication: Husband and son present throughout evaluation Disposition Plan:  Home once clinically improved Consults called: None  Admission status: It is my clinical opinion that referral for OBSERVATION is reasonable and necessary in this patient based on the above information provided. The aforementioned taken together are felt to place the patient at high risk for further clinical deterioration. However it is anticipated that the patient may be medically stable for discharge from the hospital within 24 to 48 hours.    Karmen Bongo MD [facility]  If note is complete, please contact covering daytime or nighttime  physician. [email or web address] Password [facility]  11/07/2017, 10:57 PM

## 2017-11-07 NOTE — ED Notes (Signed)
Admitting Provider at bedside. 

## 2017-11-07 NOTE — ED Notes (Signed)
Patient transported to CT 

## 2017-11-07 NOTE — ED Notes (Signed)
Pt unable to stand w/o assistance. Tech and RN present to assist pt. Pt's family member stated that pt had been using a walker for the last three days. When pt was asked to step backwards, pt unable to follow directions. Dr. Lita Mains informed.

## 2017-11-07 NOTE — ED Notes (Signed)
Pt able to state her correct bday but states today's date is 1982.  Pt states she has 3 boys which husband confirms and could correctly state their names.

## 2017-11-07 NOTE — ED Notes (Signed)
Pt was able to follow all commands on NIH scale but had to be redirected to follow commands in LLE

## 2017-11-07 NOTE — ED Triage Notes (Signed)
Pt husband states patient has no energy or strength and has been sleeping a lot.  Since Monday she has been having problems walking such as weak and unsteady. PT states it is 1981 and does not know her birthday.

## 2017-11-07 NOTE — ED Notes (Signed)
Encouraged pt to drink OJ, pt declined but agreed to sprite.

## 2017-11-07 NOTE — ED Notes (Signed)
Pt's CBG result was 64. Informed Cindy - RN.

## 2017-11-07 NOTE — ED Provider Notes (Signed)
[facility] EMERGENCY DEPARTMENT Provider Note   CSN: [id number] Arrival date & time: 11/07/17  1233     History   Chief Complaint Chief Complaint  Patient presents with  . Altered Mental Status    HPI [name] is a 66 y.o. female with history of epilepsy, HTN, GERD, depression who presents with a 2 day history of altered mental status and difficulty ambulating. She reports she has felt off balance with walking. Her husband reports she has had difficulty with memory. She denies any pain. Patient did try a new medication for sleep, trazodone, the night before her symptom onset. She did not have it last night but did have it 2 nights ago. Per nursing note, patient not able to recall the names her sons. She denies any headache, chest pain, shortness of breath, abdominal pain, nausea, vomiting, urinary symptoms.  HPI  Past Medical History:  Diagnosis Date  . Depression   . Epilepsy, grand mal ([place])    "last seizure ~ 2001" (06/16/2015)  . GERD (gastroesophageal reflux disease)   . High cholesterol   . Hypertension   . Migraine    "just about qd";  treated by Dr. [name] (10/15/2016)  . Pre-diabetes    she reports that she never took meds. for prediabetes, states MD has told her that she has corrected her situation with diet   . Seizures ([place])    "at one time; stopped in the 1990s" (10/15/2016)  . Stress at home    Pt. reports that she has a lot of personal stress & she is aware that it might being playing a part in her Migraine heaaches      Patient Active Problem List   Diagnosis Date Noted  . Polypharmacy 05/15/2017  . Meningioma ([place]) 01/09/2016  . Depression 11/08/2015  . Abnormal head movements 11/08/2015  . Somnolence, daytime 07/14/2015  . Incisional hernia 06/16/2015  . Chronic headaches 04/01/2013    Past Surgical History:  Procedure Laterality Date  . HERNIA REPAIR    . INCISIONAL HERNIA REPAIR N/A 06/16/2015   Procedure: LAPAROSCOPIC  INCISIONAL HERNIA WITH MESH;  Surgeon: Coralie Keens, MD;  Location: [facility];  Service: General;  Laterality: N/A;  . [place]  10/15/2016  . INCISIONAL HERNIA REPAIR N/A 10/15/2016   Procedure: OPEN INCISIONAL HERNIA REPAIR WITH MESH;  Surgeon: Coralie Keens, MD;  Location: [facility];  Service: General;  Laterality: N/A;  . INSERTION OF MESH N/A 06/16/2015   Procedure: INSERTION OF MESH;  Surgeon: Coralie Keens, MD;  Location: [facility];  Service: General;  Laterality: N/A;  . VAGINAL HYSTERECTOMY  ~ 1992    OB History    No data available       Home Medications    Prior to Admission medications   Medication Sig Start Date End Date Taking? Authorizing Provider  acetaminophen (TYLENOL) 500 MG tablet Take 1,000 mg by mouth every 6 (six) hours as needed for mild pain.   Yes [provider]  amLODipine (NORVASC) 5 MG tablet Take 5 mg by mouth daily after breakfast.    Yes [provider]  atorvastatin (LIPITOR) 40 MG tablet Take 40 mg by mouth daily after breakfast.    Yes [provider]  BUTRANS 7.5 MCG/HR PTWK Place 7.5 mcg every 7 (seven) days onto the skin. 10/31/17  Yes [provider]  Calcium Carb-Cholecalciferol (CALCIUM 600 + D) 600-200 MG-UNIT TABS Take 1 tablet by mouth daily.   Yes [provider]  carbamazepine (TEGRETOL XR) 200 MG 12 hr tablet Take 200 mg at bedtime by mouth. 10/23/17  Yes [provider]  conjugated estrogens (PREMARIN) vaginal cream Place 1.5 g daily vaginally. 08/27/17  Yes [provider]  divalproex (DEPAKOTE ER) 500 MG 24 hr tablet TAKE 1 TABLET BY MOUTH EVERY MORNING AND TAKE 2 TABLETS BY MOUTH EVERY EVENING Patient taking differently: TAKE 500 mg TABLET BY MOUTH EVERY MORNING AND TAKE 1000 mg  TABLETS BY MOUTH EVERY EVENING 10/11/16  Yes Marcial Pacas, MD  esomeprazole (NEXIUM) 40 MG capsule Take 40 mg by mouth daily after breakfast.  07/13/15  Yes [provider]    famotidine (PEPCID) 20 MG tablet Take 20 mg daily by mouth. 10/17/17  Yes [provider]  ferrous sulfate 325 (65 FE) MG tablet Take 325 mg by mouth daily with breakfast.   Yes [provider]  magnesium oxide (MAG-OX) 400 MG tablet Take 400 mg by mouth 2 (two) times daily.    Yes [provider]  Multiple Vitamin (MULTIVITAMIN WITH MINERALS) TABS Take 1 tablet by mouth daily.   Yes [provider]  MYRBETRIQ 50 MG TB24 tablet Take 50 mg daily by mouth. 09/23/17  Yes [provider]  olmesartan (BENICAR) 40 MG tablet Take 40 mg by mouth daily after breakfast.    Yes [provider]  tiZANidine (ZANAFLEX) 2 MG tablet TAKE 1 TABLET BY MOUTH TWICE A DAY Patient taking differently: TAKE 2 mg  TABLET BY MOUTH TWICE A DAY 04/08/17  Yes Marcial Pacas, MD  topiramate (TOPAMAX) 100 MG tablet Take 100 mg 2 (two) times daily by mouth. 07/08/14  Yes [provider]  TOVIAZ 8 MG TB24 tablet Take 8 mg by mouth at bedtime.  10/02/16  Yes [provider]  traMADol (ULTRAM) 50 MG tablet Take 1 tablet (50 mg total) by mouth every 6 (six) hours as needed. 05/15/17  Yes Marcial Pacas, MD  vitamin B-12 (CYANOCOBALAMIN) 100 MCG tablet Take 1,000 mcg daily by mouth.   Yes [provider]  ondansetron (ZOFRAN) 4 MG tablet Take 1 tablet (4 mg total) by mouth every 8 (eight) hours as needed for nausea or vomiting. Patient not taking: Reported on 11/07/2017 11/08/15   Dennie Bible, NP  oxyCODONE (OXY IR/ROXICODONE) 5 MG immediate release tablet Take 1-2 tablets (5-10 mg total) by mouth every 4 (four) hours as needed for moderate pain. Patient not taking: Reported on 11/07/2017 10/16/16   Coralie Keens, MD    Family History Family History  Problem Relation Age of Onset  . Lung cancer Mother   . Kidney failure Father   . Hypertension Other     Social History Social History   Tobacco Use  . Smoking status: Never Smoker  .  Smokeless tobacco: Never Used  Substance Use Topics  . Alcohol use: No    Alcohol/week: 0.0 oz  . Drug use: No     Allergies   Nsaids; Aspirin; and Tolmetin   Review of Systems Review of Systems  Constitutional: Negative for chills and fever.  HENT: Negative for facial swelling and sore throat.   Respiratory: Negative for shortness of breath.   Cardiovascular: Negative for chest pain.  Gastrointestinal: Negative for abdominal pain, nausea and vomiting.  Genitourinary: Negative for dysuria.  Musculoskeletal: Negative for back pain.  Skin: Negative for rash and wound.  Neurological: Negative for weakness, numbness and headaches.       Feeling off balance  Psychiatric/Behavioral: Positive  for confusion. The patient is not nervous/anxious.      Physical Exam Updated Vital Signs BP 140/62   Pulse 75   Temp (!) 97.4 F (36.3 C) (Oral)   Resp (!) 9   LMP  (Exact Date)   SpO2 100%   Physical Exam  Constitutional: She appears well-developed and well-nourished. No distress.  HENT:  Head: Normocephalic and atraumatic.  Mouth/Throat: Oropharynx is clear and moist. No oropharyngeal exudate.  Eyes: Conjunctivae are normal. Pupils are equal, round, and reactive to light. Right eye exhibits no discharge. Left eye exhibits no discharge. No scleral icterus.  Neck: Normal range of motion. Neck supple. No thyromegaly present.  Cardiovascular: Normal rate, regular rhythm, normal heart sounds and intact distal pulses. Exam reveals no gallop and no friction rub.  No murmur heard. Pulmonary/Chest: Effort normal and breath sounds normal. No stridor. No respiratory distress. She has no wheezes. She has no rales.  Abdominal: Soft. Bowel sounds are normal. She exhibits no distension. There is no tenderness. There is no rebound and no guarding.  Musculoskeletal: She exhibits no edema.  Lymphadenopathy:    She has no cervical adenopathy.  Neurological: She is alert. Coordination normal.  CN  3-12 intact; normal sensation throughout; 5/5 strength in all 4 extremities; equal bilateral grip strength; no ataxia on finger-to-nose but patient slow to follow the command Patient oriented to self and place, not time, believes it is 1981  Skin: Skin is warm and dry. No rash noted. She is not diaphoretic. No pallor.  Psychiatric: She has a normal mood and affect.  Nursing note and vitals reviewed.    ED Treatments / Results  Labs (all labs ordered are listed, but only abnormal results are displayed) Labs Reviewed  COMPREHENSIVE METABOLIC PANEL - Abnormal; Notable for the following components:      Result Value   Potassium 3.3 (*)    BUN 26 (*)    Creatinine, Ser 1.31 (*)    ALT 12 (*)    GFR calc non Af Amer 41 (*)    GFR calc Af Amer 48 (*)    All other components within normal limits  CBC - Abnormal; Notable for the following components:   WBC 11.6 (*)    All other components within normal limits  URINALYSIS, ROUTINE W REFLEX MICROSCOPIC - Abnormal; Notable for the following components:   APPearance CLOUDY (*)    All other components within normal limits  AMMONIA - Abnormal; Notable for the following components:   Ammonia 59 (*)    All other components within normal limits  CBG MONITORING, ED - Abnormal; Notable for the following components:   Glucose-Capillary 61 (*)    All other components within normal limits  CBG MONITORING, ED - Abnormal; Notable for the following components:   Glucose-Capillary 64 (*)    All other components within normal limits  CBG MONITORING, ED - Abnormal; Notable for the following components:   Glucose-Capillary 124 (*)    All other components within normal limits  VALPROIC ACID LEVEL    EKG  EKG Interpretation None       Radiology Ct Head Wo Contrast  Result Date: 11/07/2017 CLINICAL DATA:  Altered level of consciousness EXAM: CT HEAD WITHOUT CONTRAST TECHNIQUE: Contiguous axial images were obtained from the base of the skull  through the vertex without intravenous contrast. COMPARISON:  12/21/2015, 12/25/2013 FINDINGS: Brain: No acute territorial infarction, hemorrhage or intracranial mass is visualized. Mild atrophy. Mildly prominent ventricles likely due to atrophy. Vascular:  No hyperdense vessels.  Carotid vessel calcification Skull: Normal. Negative for fracture or focal lesion. Sinuses/Orbits: No acute finding. Other: None IMPRESSION: No CT evidence for acute intracranial abnormality. Electronically Signed   By: Donavan Foil M.D.   On: 11/07/2017 16:28    Procedures Procedures (including critical care time)  Medications Ordered in ED Medications - No data to display   Initial Impression / Assessment and Plan / ED Course  I have reviewed the triage vital signs and the nursing notes.  Pertinent labs & imaging results that were available during my care of the patient were reviewed by me and considered in my medical decision making (see chart for details).     Patient presenting with altered mental status, confusion, and abnormal gait for the past 2 days.  Labs, urine, CT head are unremarkable, except for elevated ammonia level at 59 mild elevation in BUN, creatinine, 26 and 1.31.  Patient did start trazodone the night before onset, however patient unable to walk here in the ED.  I feel patient should be admitted for further workup diuresis.  Patient also evaluated by Dr. Lita Mains who guided the patient's management and agrees with plan. I consulted [facility] and spoke with Dr. Lorin Mercy who will admit the patient for further evaluation and treatment.  Patient vitals stable throughout ED course.  Final Clinical Impressions(s) / ED Diagnoses   Final diagnoses:  Altered mental status, unspecified altered mental status type  Confusion  Gait instability    ED Discharge Orders    None       Caryl Ada 11/07/17 2107    Julianne Rice, MD 11/07/17 2154

## 2017-11-07 NOTE — ED Notes (Signed)
RN attempted to call report to floor; RN to call back  

## 2017-11-08 ENCOUNTER — Observation Stay (HOSPITAL_COMMUNITY): Payer: Medicare Other

## 2017-11-08 DIAGNOSIS — N179 Acute kidney failure, unspecified: Secondary | ICD-10-CM | POA: Diagnosis present

## 2017-11-08 DIAGNOSIS — G92 Toxic encephalopathy: Secondary | ICD-10-CM | POA: Diagnosis present

## 2017-11-08 DIAGNOSIS — I10 Essential (primary) hypertension: Secondary | ICD-10-CM | POA: Diagnosis present

## 2017-11-08 DIAGNOSIS — E785 Hyperlipidemia, unspecified: Secondary | ICD-10-CM | POA: Diagnosis present

## 2017-11-08 DIAGNOSIS — G40909 Epilepsy, unspecified, not intractable, without status epilepticus: Secondary | ICD-10-CM | POA: Diagnosis present

## 2017-11-08 DIAGNOSIS — T50905A Adverse effect of unspecified drugs, medicaments and biological substances, initial encounter: Secondary | ICD-10-CM | POA: Diagnosis present

## 2017-11-08 DIAGNOSIS — F0390 Unspecified dementia without behavioral disturbance: Secondary | ICD-10-CM | POA: Diagnosis present

## 2017-11-08 DIAGNOSIS — G44009 Cluster headache syndrome, unspecified, not intractable: Secondary | ICD-10-CM | POA: Diagnosis present

## 2017-11-08 DIAGNOSIS — G9340 Encephalopathy, unspecified: Secondary | ICD-10-CM | POA: Diagnosis not present

## 2017-11-08 DIAGNOSIS — R410 Disorientation, unspecified: Secondary | ICD-10-CM | POA: Diagnosis not present

## 2017-11-08 DIAGNOSIS — R4182 Altered mental status, unspecified: Secondary | ICD-10-CM | POA: Diagnosis present

## 2017-11-08 LAB — BASIC METABOLIC PANEL
Anion gap: 7 (ref 5–15)
BUN: 20 mg/dL (ref 6–20)
CO2: 26 mmol/L (ref 22–32)
Calcium: 8.9 mg/dL (ref 8.9–10.3)
Chloride: 106 mmol/L (ref 101–111)
Creatinine, Ser: 1.21 mg/dL — ABNORMAL HIGH (ref 0.44–1.00)
GFR calc Af Amer: 52 mL/min — ABNORMAL LOW (ref >60)
GFR calc non Af Amer: 45 mL/min — ABNORMAL LOW (ref >60)
Glucose, Bld: 106 mg/dL — ABNORMAL HIGH (ref 65–99)
Potassium: 3.5 mmol/L (ref 3.5–5.1)
Sodium: 139 mmol/L (ref 135–145)

## 2017-11-08 LAB — CBC
HCT: 39.8 % (ref 36.0–46.0)
Hemoglobin: 12.8 g/dL (ref 12.0–15.0)
MCH: 30.3 pg (ref 26.0–34.0)
MCHC: 32.2 g/dL (ref 30.0–36.0)
MCV: 94.1 fL (ref 78.0–100.0)
Platelets: 237 10*3/uL (ref 150–400)
RBC: 4.23 MIL/uL (ref 3.87–5.11)
RDW: 12.9 % (ref 11.5–15.5)
WBC: 10.5 10*3/uL (ref 4.0–10.5)

## 2017-11-08 LAB — RAPID URINE DRUG SCREEN, HOSP PERFORMED
Amphetamines: NOT DETECTED
Barbiturates: NOT DETECTED
Benzodiazepines: NOT DETECTED
Cocaine: NOT DETECTED
Opiates: NOT DETECTED
Tetrahydrocannabinol: NOT DETECTED

## 2017-11-08 LAB — CARBAMAZEPINE LEVEL, TOTAL: Carbamazepine Lvl: 5.1 ug/mL (ref 4.0–12.0)

## 2017-11-08 MED ORDER — LORAZEPAM 2 MG/ML IJ SOLN
0.5000 mg | Freq: Once | INTRAMUSCULAR | Status: AC
  Administered 2017-11-08: 0.5 mg via INTRAVENOUS
  Filled 2017-11-08: qty 1

## 2017-11-08 MED ORDER — LACTATED RINGERS IV SOLN
INTRAVENOUS | Status: AC
  Administered 2017-11-08 – 2017-11-10 (×4): via INTRAVENOUS

## 2017-11-08 NOTE — H&P (Signed)
PROGRESS NOTE    [name]  EHU:[id number] DOB: [birth year] DOA: 11/07/2017 PCP: Carol Ada, MD  Outpatient Specialists:   Brief Narrative:  Patient is a 66 year old female with past medical history significant of remote seizures; pre-diabetes; migraines; HTN; HLD; and mild dementia (suspected). Patient was seen alongside patient's husband, and both are poor historians. Apparently, patient has life long history of seizure (but none in the last 15 years) and Migraine headache. Patient is said to have developed unsteady gait after being started on a new medication by an urgent care 4 days ago, with increasing confusion. Details of the gait imbalance are unclear. Patient was on multiple mind altering medications prior to admission, and was noted to have slightly increased Scr and Urine [place] of 1.019. Patient is on IVF. Medications have been stream lined, and MRI of the head is pending. Discussed with Neuro and they have advised we should await MRI head. Patient is known to a Neurologist at one of the local hospitals.Will consult Physical Therapy for detailed assessment, as that will also inform disposition.  Assessment & Plan:   Principal Problem:   Acute encephalopathy Active Problems:   Polypharmacy   Seizure disorder (HCC)   Dementia   Essential hypertension   Headaches, cluster   AKI (acute kidney injury) ([place])  - Follow results of MRI head - Continue IVF - Continue to monitor renal function and electrolytes - Optimize BP - Consult PT - See Neuro Input as above - Continue to minimize mind altering medications   DVT prophylaxis: [place] Lovenox Code Status: Full Family Communication: Husband Disposition Plan: Will depend on MRI head and PT evaluation   Consultants:   Neuro if MRI Brain reveals new significant finding  Procedures:   None  Antimicrobials:   None    Subjective: Nil new complaints  Objective: Vitals:   11/07/17 2000 11/07/17 2145 11/07/17 2318  11/08/17 0547  BP: 140/62 (!) 151/64  131/64  Pulse: 75 98  82  Resp: (!) 9 13  18   Temp:    98.3 F (36.8 C)  TempSrc:    Oral  SpO2: 100% 100%  97%  Weight:   61.2 kg (135 lb)   Height:   5' (1.524 m)     Intake/Output Summary (Last 24 hours) at 11/08/2017 0954 Last data filed at 11/08/2017 0519 Gross per 24 hour  Intake 685 ml  Output 200 ml  Net 485 ml   Filed Weights   11/07/17 2318  Weight: 61.2 kg (135 lb)    Examination:  General exam: Appears calm and comfortable HEENT - Moist buccal mucosa. Mild Pallor. No jaundice Respiratory system: Clear to auscultation. Respiratory effort normal. Cardiovascular system: S1 & S2 heard, RRR. No JVD, murmurs, rubs, gallops or clicks. Minimal pedal edema. Gastrointestinal system: Abdomen is nondistended, soft and nontender. No organomegaly or masses felt. Normal bowel sounds heard. Central nervous system: Awake and alert. Patient moves all limbs. Extremities: Mild leg edema Skin: No rashes, lesions or ulcers    Data Reviewed: I have personally reviewed following labs and imaging studies  CBC: Recent Labs  Lab 11/07/17 1302 11/08/17 0432  WBC 11.6* 10.5  HGB 14.0 12.8  HCT 42.8 39.8  MCV 94.5 94.1  PLT 259 785   Basic Metabolic Panel: Recent Labs  Lab 11/07/17 1302 11/08/17 0432  NA 139 139  K 3.3* 3.5  CL 107 106  CO2 24 26  GLUCOSE 92 106*  BUN 26* 20  CREATININE 1.31* 1.21*  CALCIUM 9.4 8.9   GFR: Estimated Creatinine Clearance: 36.9 mL/min (A) (by C-G formula based on SCr of 1.21 mg/dL (H)). Liver Function Tests: Recent Labs  Lab 11/07/17 1302  AST 18  ALT 12*  ALKPHOS 46  BILITOT 0.4  PROT 6.9  ALBUMIN 3.8   No results for input(s): LIPASE, AMYLASE in the last 168 hours. Recent Labs  Lab 11/07/17 1528  AMMONIA 59*   Coagulation Profile: No results for input(s): INR, PROTIME in the last 168 hours. Cardiac Enzymes: No results for input(s): CKTOTAL, CKMB, CKMBINDEX, TROPONINI in the last  168 hours. BNP (last 3 results) No results for input(s): PROBNP in the last 8760 hours. HbA1C: No results for input(s): HGBA1C in the last 72 hours. CBG: Recent Labs  Lab 11/07/17 1302 11/07/17 1420 11/07/17 1532  GLUCAP 61* 64* 124*   Lipid Profile: No results for input(s): CHOL, HDL, LDLCALC, TRIG, CHOLHDL, LDLDIRECT in the last 72 hours. Thyroid Function Tests: No results for input(s): TSH, T4TOTAL, FREET4, T3FREE, THYROIDAB in the last 72 hours. Anemia Panel: No results for input(s): VITAMINB12, FOLATE, FERRITIN, TIBC, IRON, RETICCTPCT in the last 72 hours. Urine analysis:    Component Value Date/Time   COLORURINE YELLOW 11/07/2017 1833   APPEARANCEUR CLOUDY (A) 11/07/2017 1833   LABSPEC 1.019 11/07/2017 1833   PHURINE 7.0 11/07/2017 1833   GLUCOSEU NEGATIVE 11/07/2017 1833   HGBUR NEGATIVE 11/07/2017 1833   BILIRUBINUR NEGATIVE 11/07/2017 1833   KETONESUR NEGATIVE 11/07/2017 1833   PROTEINUR NEGATIVE 11/07/2017 1833   NITRITE NEGATIVE 11/07/2017 1833   LEUKOCYTESUR NEGATIVE 11/07/2017 1833   Sepsis Labs: @LABRCNTIP (procalcitonin:4,lacticidven:4)  )No results found for this or any previous visit (from the past 240 hour(s)).       Radiology Studies: Ct Head Wo Contrast  Result Date: 11/07/2017 CLINICAL DATA:  Altered level of consciousness EXAM: CT HEAD WITHOUT CONTRAST TECHNIQUE: Contiguous axial images were obtained from the base of the skull through the vertex without intravenous contrast. COMPARISON:  12/21/2015, 12/25/2013 FINDINGS: Brain: No acute territorial infarction, hemorrhage or intracranial mass is visualized. Mild atrophy. Mildly prominent ventricles likely due to atrophy. Vascular: No hyperdense vessels.  Carotid vessel calcification Skull: Normal. Negative for fracture or focal lesion. Sinuses/Orbits: No acute finding. Other: None IMPRESSION: No CT evidence for acute intracranial abnormality. Electronically Signed   By: Donavan Foil M.D.   On:  11/07/2017 16:28   Mr Brain Wo Contrast  Result Date: 11/08/2017 CLINICAL DATA:  Loss of strength and energy with somnolence. Some confusion. History of meningioma. EXAM: MRI HEAD WITHOUT CONTRAST TECHNIQUE: Multiplanar, multiecho pulse sequences of the brain and surrounding structures were obtained without intravenous contrast. COMPARISON:  CT 11/07/2017.  MRI 12/21/2015. FINDINGS: Brain: Diffusion imaging does not show any acute or subacute infarction. There is mild age related volume loss, not advanced for age. No evidence of small-vessel disease or large vessel infarction. No intra-axial mass lesion, hemorrhage, hydrocephalus or extra-axial fluid collection. This examination was done without contrast. Left-sided planum sphenoidale meningioma appears similar, measuring approximately 11 x 7 mm. Left frontoparietal convexity meningioma appears similar, measuring approximately 1 cm in size. Neither has any significant mass-effect upon the brain. Vascular: Major vessels at the base of the brain show flow. Skull and upper cervical spine: Negative Sinuses/Orbits: Clear/normal Other: None IMPRESSION: No acute or reversible finding. No brain parenchymal pathology to explain the clinical presentation. Two small meningiomas, 1 along the left planum sphenoidale on the other at the left frontoparietal vertex convexity. No visible change since the study  of 12/26, though today's study is done without contrast. No significant mass-effect upon the brain. Electronically Signed   By: Nelson Chimes M.D.   On: 11/08/2017 09:36        Scheduled Meds: . amLODipine  5 mg Oral QPC breakfast  . atorvastatin  40 mg Oral QPC breakfast  . divalproex  1,000 mg Oral QHS  . divalproex  500 mg Oral Daily  . docusate sodium  100 mg Oral BID  . enoxaparin (LOVENOX) injection  40 mg Subcutaneous Q24H  . fesoterodine  8 mg Oral QHS  . pantoprazole  40 mg Oral Daily  . topiramate  100 mg Oral BID   Continuous Infusions: .  lactated ringers       LOS: 0 days    Time spent: 32 Minutes.    Dana Allan, MD  [facility] Pager #: [phone] 7PM-7AM contact night coverage as above

## 2017-11-08 NOTE — Evaluation (Signed)
Physical Therapy Evaluation Patient Details Name: [name] MRN: [id number] DOB: [birth year] Today's Date: 11/08/2017   History of Present Illness  Pt is a 66 y/o female admitted secondary to AMS/acute encephalopathy. MRI was negative. PMH including but not limited dementia and seizures.   Clinical Impression  Pt presented supine in bed with HOB elevated, awake and willing to participate in therapy session. Pt's spouse present throughout. Pt currently requires min-mod A for bed mobility and mod A for transfers. Pt very limited secondary to fatigue, weakness and cognitive deficits. Pt would continue to benefit from skilled physical therapy services at this time while admitted and after d/c to address the below listed limitations in order to improve overall safety and independence with functional mobility.     Follow Up Recommendations SNF    Equipment Recommendations  None recommended by PT    Recommendations for Other Services       Precautions / Restrictions Precautions Precautions: Fall Restrictions Weight Bearing Restrictions: No      Mobility  Bed Mobility Overal bed mobility: Needs Assistance Bed Mobility: Supine to Sit;Sit to Supine     Supine to sit: Min assist Sit to supine: Mod assist   General bed mobility comments: increased time and effort, assist to elevate trunk and to return bilateral LEs onto bed  Transfers Overall transfer level: Needs assistance Equipment used: 1 person hand held assist Transfers: Sit to/from [employer] Transfers Sit to Stand: Mod assist Stand pivot transfers: Mod assist       General transfer comment: increased time and effort, cueing for safety, assist for stability  Ambulation/Gait                Stairs            Wheelchair Mobility    Modified Rankin (Stroke Patients Only)       Balance Overall balance assessment: Needs assistance Sitting-balance support: Feet supported Sitting balance-Leahy  Scale: Fair     Standing balance support: During functional activity;Single extremity supported Standing balance-Leahy Scale: Poor                               Pertinent Vitals/Pain Pain Assessment: No/denies pain    Home Living Family/patient expects to be discharged to:: Private residence Living Arrangements: Spouse/significant other;Children Available Help at Discharge: Family;Available 24 hours/day Type of Home: House Home Access: Level entry     Home Layout: One level Home Equipment: Cane - single point;Walker - 2 wheels;Shower seat      Prior Function Level of Independence: Independent               Hand Dominance        Extremity/Trunk Assessment   Upper Extremity Assessment Upper Extremity Assessment: Generalized weakness    Lower Extremity Assessment Lower Extremity Assessment: Generalized weakness       Communication   Communication: No difficulties  Cognition Arousal/Alertness: Awake/alert Behavior During Therapy: Flat affect Overall Cognitive Status: Impaired/Different from baseline Area of Impairment: Attention;Memory;Safety/judgement;Problem solving                   Current Attention Level: Sustained Memory: Decreased recall of precautions;Decreased short-term memory   Safety/Judgement: Decreased awareness of deficits;Decreased awareness of safety   Problem Solving: Slow processing;Decreased initiation;Difficulty sequencing;Requires verbal cues;Requires tactile cues        General Comments      Exercises     Assessment/Plan  PT Assessment Patient needs continued PT services  PT Problem List Decreased strength;Decreased balance;Decreased activity tolerance;Decreased mobility;Decreased coordination;Decreased knowledge of use of DME;Decreased safety awareness;Decreased knowledge of precautions       PT Treatment Interventions DME instruction;Gait training;Stair training;Functional mobility  training;Therapeutic activities;Therapeutic exercise;Balance training;Neuromuscular re-education;Cognitive remediation;Patient/family education    PT Goals (Current goals can be found in the Care Plan section)  Acute Rehab PT Goals Patient Stated Goal: return to independence PT Goal Formulation: With patient/family Time For Goal Achievement: 11/22/17 Potential to Achieve Goals: Fair    Frequency Min 2X/week   Barriers to discharge        Co-evaluation               AM-PAC PT "6 Clicks" Daily Activity  Outcome Measure Difficulty turning over in bed (including adjusting bedclothes, sheets and blankets)?: A Lot Difficulty moving from lying on back to sitting on the side of the bed? : Unable Difficulty sitting down on and standing up from a chair with arms (e.g., wheelchair, bedside commode, etc,.)?: Unable Help needed moving to and from a bed to chair (including a wheelchair)?: A Lot Help needed walking in hospital room?: A Lot Help needed climbing 3-5 steps with a railing? : Total 6 Click Score: 9    End of Session Equipment Utilized During Treatment: Gait belt Activity Tolerance: Patient limited by fatigue Patient left: in bed;with call bell/phone within reach;with bed alarm set;with family/visitor present Nurse Communication: Mobility status PT Visit Diagnosis: Other abnormalities of gait and mobility (R26.89)    Time: 3154-0086 PT Time Calculation (min) (ACUTE ONLY): 26 min   Charges:   PT Evaluation $PT Eval Moderate Complexity: 1 Mod PT Treatments $Therapeutic Activity: 8-22 mins   PT G Codes:   PT G-Codes **NOT FOR INPATIENT CLASS** Functional Assessment Tool Used: AM-PAC 6 Clicks Basic Mobility;Clinical judgement Functional Limitation: Mobility: Walking and moving around Mobility: Walking and Moving Around Current Status (P6195): At least 60 percent but less than 80 percent impaired, limited or restricted Mobility: Walking and Moving Around Goal Status  [phone]): At least 20 percent but less than 40 percent impaired, limited or restricted    [facility], [place], DPT [place] 11/08/2017, 5:00 PM

## 2017-11-08 NOTE — Progress Notes (Signed)
Admitted pt.from ED ,AAO X3,accompanied by husband ,no acute resp.distress noted.V/S taken &recordeD.With IV in place on rt.FA ;site is clear no signs of infiltration noted.Oriented pt.to the room ,call bell,information packet given to pt.Fall assessment completed w/ pt.& able to verbalized understanding of risks associated w/ falls.Call light w/in reach ;Skin warm & dry & intact except with small bruise on rt.thigh .Placed pt.on tele,# 32.Will continue to monitor pt.

## 2017-11-09 NOTE — H&P (Signed)
PROGRESS NOTE    [name]  GDJ:[id number] DOB: 07/27/1951 DOA: 11/07/2017 PCP: Carol Ada, MD  Outpatient Specialists:   Brief Narrative:  Patient is a 66 year old female with past medical history significant of remote seizures; pre-diabetes; migraines; HTN; HLD; and mild dementia (suspected). Patient was seen alongside patient's husband, and both are poor historians. Apparently, patient has life long history of seizure (but none in the last 15 years) and Migraine headache. Patient is said to have developed unsteady gait after being started on a new medication by an urgent care 4 days ago, with increasing confusion. Details of the gait imbalance are unclear. Patient was on multiple mind altering medications prior to admission, and was noted to have slightly increased Scr and Urine SG of 1.019. Patient is on IVF. Medications have been stream lined, and MRI of the head is pending. Discussed with Neuro and they have advised we should await MRI head. Patient is known to a Neurologist at one of the local hospitals.PT input is appreciated, for SNF.   Assessment & Plan:   Principal Problem:   Acute encephalopathy Active Problems:   Polypharmacy   Seizure disorder (HCC)   Dementia   Essential hypertension   Headaches, cluster   AKI (acute kidney injury) ([place])   Altered mental status  - Follow results of MRI head - Continue IVF - Continue to monitor renal function and electrolytes - Optimize BP - SNF as per PT - Continue to minimize mind altering medications   DVT prophylaxis: [place] Lovenox Code Status: Full Family Communication: Husband Disposition Plan: Will depend on MRI head and PT evaluation   Consultants:   Neuro if MRI Brain reveals new significant finding  Procedures:   None  Antimicrobials:   None    Subjective: Nil new complaints  Objective: Vitals:   11/08/17 1451 11/08/17 2127 11/09/17 0524 11/09/17 1551  BP: (!) 136/53 133/60 (!) 150/67 (!) 147/54    Pulse: 83 79 79 79  Resp: 18 16 16 16   Temp: 98.4 F (36.9 C) 98.4 F (36.9 C) 97.6 F (36.4 C) 98.9 F (37.2 C)  TempSrc: Oral Oral Oral Oral  SpO2: 97% 97% 99% 100%  Weight:      Height:        Intake/Output Summary (Last 24 hours) at 11/09/2017 1601 Last data filed at 11/09/2017 0600 Gross per 24 hour  Intake 1320 ml  Output 100 ml  Net 1220 ml   Filed Weights   11/07/17 2318  Weight: 61.2 kg (135 lb)    Examination:  General exam: Appears calm and comfortable HEENT - Moist buccal mucosa. Mild Pallor. No jaundice Respiratory system: Clear to auscultation. Respiratory effort normal. Cardiovascular system: S1 & S2 heard, RRR. No JVD, murmurs, rubs, gallops or clicks. Minimal pedal edema. Gastrointestinal system: Abdomen is nondistended, soft and nontender. No organomegaly or masses felt. Normal bowel sounds heard. Central nervous system: Awake and alert. Patient moves all limbs. Extremities: Mild leg edema Skin: No rashes, lesions or ulcers    Data Reviewed: I have personally reviewed following labs and imaging studies  CBC: Recent Labs  Lab 11/07/17 1302 11/08/17 0432  WBC 11.6* 10.5  HGB 14.0 12.8  HCT 42.8 39.8  MCV 94.5 94.1  PLT 259 622   Basic Metabolic Panel: Recent Labs  Lab 11/07/17 1302 11/08/17 0432  NA 139 139  K 3.3* 3.5  CL 107 106  CO2 24 26  GLUCOSE 92 106*  BUN 26* 20  CREATININE 1.31*  1.21*  CALCIUM 9.4 8.9   GFR: Estimated Creatinine Clearance: 36.9 mL/min (A) (by C-G formula based on SCr of 1.21 mg/dL (H)). Liver Function Tests: Recent Labs  Lab 11/07/17 1302  AST 18  ALT 12*  ALKPHOS 46  BILITOT 0.4  PROT 6.9  ALBUMIN 3.8   No results for input(s): LIPASE, AMYLASE in the last 168 hours. Recent Labs  Lab 11/07/17 1528  AMMONIA 59*   Coagulation Profile: No results for input(s): INR, PROTIME in the last 168 hours. Cardiac Enzymes: No results for input(s): CKTOTAL, CKMB, CKMBINDEX, TROPONINI in the last 168  hours. BNP (last 3 results) No results for input(s): PROBNP in the last 8760 hours. HbA1C: No results for input(s): HGBA1C in the last 72 hours. CBG: Recent Labs  Lab 11/07/17 1302 11/07/17 1420 11/07/17 1532  GLUCAP 61* 64* 124*   Lipid Profile: No results for input(s): CHOL, HDL, LDLCALC, TRIG, CHOLHDL, LDLDIRECT in the last 72 hours. Thyroid Function Tests: No results for input(s): TSH, T4TOTAL, FREET4, T3FREE, THYROIDAB in the last 72 hours. Anemia Panel: No results for input(s): VITAMINB12, FOLATE, FERRITIN, TIBC, IRON, RETICCTPCT in the last 72 hours. Urine analysis:    Component Value Date/Time   COLORURINE YELLOW 11/07/2017 1833   APPEARANCEUR CLOUDY (A) 11/07/2017 1833   LABSPEC 1.019 11/07/2017 1833   PHURINE 7.0 11/07/2017 1833   GLUCOSEU NEGATIVE 11/07/2017 1833   HGBUR NEGATIVE 11/07/2017 1833   BILIRUBINUR NEGATIVE 11/07/2017 1833   KETONESUR NEGATIVE 11/07/2017 1833   PROTEINUR NEGATIVE 11/07/2017 1833   NITRITE NEGATIVE 11/07/2017 1833   LEUKOCYTESUR NEGATIVE 11/07/2017 1833   Sepsis Labs: @LABRCNTIP (procalcitonin:4,lacticidven:4)  )No results found for this or any previous visit (from the past 240 hour(s)).       Radiology Studies: Ct Head Wo Contrast  Result Date: 11/07/2017 CLINICAL DATA:  Altered level of consciousness EXAM: CT HEAD WITHOUT CONTRAST TECHNIQUE: Contiguous axial images were obtained from the base of the skull through the vertex without intravenous contrast. COMPARISON:  12/21/2015, 12/25/2013 FINDINGS: Brain: No acute territorial infarction, hemorrhage or intracranial mass is visualized. Mild atrophy. Mildly prominent ventricles likely due to atrophy. Vascular: No hyperdense vessels.  Carotid vessel calcification Skull: Normal. Negative for fracture or focal lesion. Sinuses/Orbits: No acute finding. Other: None IMPRESSION: No CT evidence for acute intracranial abnormality. Electronically Signed   By: Donavan Foil M.D.   On: 11/07/2017  16:28   Mr Brain Wo Contrast  Result Date: 11/08/2017 CLINICAL DATA:  Loss of strength and energy with somnolence. Some confusion. History of meningioma. EXAM: MRI HEAD WITHOUT CONTRAST TECHNIQUE: Multiplanar, multiecho pulse sequences of the brain and surrounding structures were obtained without intravenous contrast. COMPARISON:  CT 11/07/2017.  MRI 12/21/2015. FINDINGS: Brain: Diffusion imaging does not show any acute or subacute infarction. There is mild age related volume loss, not advanced for age. No evidence of small-vessel disease or large vessel infarction. No intra-axial mass lesion, hemorrhage, hydrocephalus or extra-axial fluid collection. This examination was done without contrast. Left-sided planum sphenoidale meningioma appears similar, measuring approximately 11 x 7 mm. Left frontoparietal convexity meningioma appears similar, measuring approximately 1 cm in size. Neither has any significant mass-effect upon the brain. Vascular: Major vessels at the base of the brain show flow. Skull and upper cervical spine: Negative Sinuses/Orbits: Clear/normal Other: None IMPRESSION: No acute or reversible finding. No brain parenchymal pathology to explain the clinical presentation. Two small meningiomas, 1 along the left planum sphenoidale on the other at the left frontoparietal vertex convexity. No visible change since  the study of 12/26, though today's study is done without contrast. No significant mass-effect upon the brain. Electronically Signed   By: Nelson Chimes M.D.   On: 11/08/2017 09:36        Scheduled Meds: . amLODipine  5 mg Oral QPC breakfast  . atorvastatin  40 mg Oral QPC breakfast  . divalproex  1,000 mg Oral QHS  . divalproex  500 mg Oral Daily  . docusate sodium  100 mg Oral BID  . enoxaparin (LOVENOX) injection  40 mg Subcutaneous Q24H  . fesoterodine  8 mg Oral QHS  . pantoprazole  40 mg Oral Daily  . topiramate  100 mg Oral BID   Continuous Infusions: . lactated  ringers 100 mL/hr at 11/09/17 0852     LOS: 1 day    Time spent: 32 Minutes.    Dana Allan, MD  [facility] Pager #: [phone] 7PM-7AM contact night coverage as above

## 2017-11-09 NOTE — Care Management Note (Addendum)
Case Management Note  Patient Details  Name: [name] MRN: [id number] Date of Birth: 08/09/1951  Subjective/Objective:    Acute Encephalopathy               Action/Plan: CM talked to patient at the bedside; patient want to go home with [facility] services; [facility] choice offered, pt chose [facility]; Brad with [facility] called for arrangements; B Anneth Brunell RN,MHA,BSN  2:54 pm- patient's husband stated that she changed her mind and now she wants rehab; SW referral placed for SNF placement. Mindi Slicker RN,MHA,BSN  Expected Discharge Date:    possibly 11/10/2017              Expected Discharge Plan:  [place]  Discharge planning Services  CM Consult Choice offered to:  Patient, Spouse  HH Arranged:  RN, PT, OT, Nurse's Aide vs SNF placement ( patient keeps going back and forth between the two) [facility] Agency:  [place]  Status of Service:  In process, will continue to follow  Sherrilyn Rist [phone] 11/09/2017, 2:49 PM

## 2017-11-10 LAB — RENAL FUNCTION PANEL
Albumin: 2.9 g/dL — ABNORMAL LOW (ref 3.5–5.0)
Anion gap: 8 (ref 5–15)
BUN: 16 mg/dL (ref 6–20)
CO2: 23 mmol/L (ref 22–32)
Calcium: 9 mg/dL (ref 8.9–10.3)
Chloride: 109 mmol/L (ref 101–111)
Creatinine, Ser: 0.96 mg/dL (ref 0.44–1.00)
GFR calc Af Amer: >60 mL/min (ref >60)
GFR calc non Af Amer: 60 mL/min — ABNORMAL LOW (ref >60)
Glucose, Bld: 114 mg/dL — ABNORMAL HIGH (ref 65–99)
Phosphorus: 3.3 mg/dL (ref 2.5–4.6)
Potassium: 3.4 mmol/L — ABNORMAL LOW (ref 3.5–5.1)
Sodium: 140 mmol/L (ref 135–145)

## 2017-11-10 MED ORDER — POTASSIUM CHLORIDE CRYS ER 20 MEQ PO TBCR
40.0000 meq | EXTENDED_RELEASE_TABLET | Freq: Once | ORAL | Status: AC
  Administered 2017-11-10: 40 meq via ORAL
  Filled 2017-11-10: qty 2

## 2017-11-10 NOTE — H&P (Signed)
PROGRESS NOTE    [name]  OXB:[id number] DOB: 1951-11-11 DOA: 11/07/2017 PCP: Carol Ada, MD  Outpatient Specialists:   Brief Narrative:  Patient is a 66 year old female with past medical history significant of remote seizures; pre-diabetes; migraines; HTN; HLD; and mild dementia (suspected). Patient was seen alongside patient's husband, and both are poor historians. Apparently, patient has life long history of seizure (but none in the last 15 years) and Migraine headache. Patient is said to have developed unsteady gait after being started on a new medication by an urgent care 4 days ago, with increasing confusion. Details of the gait imbalance are unclear. Patient was on multiple mind altering medications prior to admission, and was noted to have slightly increased Scr and Urine SG of 1.019. Patient is on IVF. Medications have been stream lined, and MRI of the head is pending. Discussed with Neuro and they have advised we should await MRI head. Patient is known to a Neurologist at one of the local hospitals.PT input is appreciated, for SNF.   Patient will be discharged to a SNF once arrangements are made.  Assessment & Plan:   Principal Problem:   Acute encephalopathy Active Problems:   Polypharmacy   Seizure disorder (HCC)   Dementia   Essential hypertension   Headaches, cluster   AKI (acute kidney injury) ([place])   Altered mental status  - MRI head is non revealing. - Continue to monitor renal function and electrolytes - Optimize BP - SNF as per PT - Continue to minimize mind altering medications   DVT prophylaxis: [place] Lovenox Code Status: Full Family Communication: Husband Disposition Plan: Will depend on MRI head and PT evaluation   Consultants:   None.  Procedures:   None  Antimicrobials:   None    Subjective: Nil new complaints  Objective: Vitals:   11/09/17 1551 11/09/17 2311 11/10/17 0250 11/10/17 0544  BP: (!) 147/54 (!) 146/60 (!) 157/67  (!) 146/66  Pulse: 79 79 86 92  Resp: 16 12 16 16   Temp: 98.9 F (37.2 C) 98 F (36.7 C) 98.5 F (36.9 C) 98.2 F (36.8 C)  TempSrc: Oral Oral Oral Oral  SpO2: 100% 100% 100% 100%  Weight:      Height:        Intake/Output Summary (Last 24 hours) at 11/10/2017 1012 Last data filed at 11/10/2017 0432 Gross per 24 hour  Intake 1290 ml  Output -  Net 1290 ml   Filed Weights   11/07/17 2318  Weight: 61.2 kg (135 lb)    Examination:  General exam: Appears calm and comfortable HEENT - Moist buccal mucosa. Mild Pallor. No jaundice Respiratory system: Clear to auscultation. Respiratory effort normal. Cardiovascular system: S1 & S2 heard, RRR. No JVD, murmurs, rubs, gallops or clicks. Minimal pedal edema. Gastrointestinal system: Abdomen is nondistended, soft and nontender. No organomegaly or masses felt. Normal bowel sounds heard. Central nervous system: Awake and alert. Patient moves all limbs. Extremities: Mild leg edema Skin: No rashes, lesions or ulcers    Data Reviewed: I have personally reviewed following labs and imaging studies  CBC: Recent Labs  Lab 11/07/17 1302 11/08/17 0432  WBC 11.6* 10.5  HGB 14.0 12.8  HCT 42.8 39.8  MCV 94.5 94.1  PLT 259 683   Basic Metabolic Panel: Recent Labs  Lab 11/07/17 1302 11/08/17 0432 11/10/17 0236  NA 139 139 140  K 3.3* 3.5 3.4*  CL 107 106 109  CO2 24 26 23   GLUCOSE 92 106*  114*  BUN 26* 20 16  CREATININE 1.31* 1.21* 0.96  CALCIUM 9.4 8.9 9.0  PHOS  --   --  3.3   GFR: Estimated Creatinine Clearance: 46.5 mL/min (by C-G formula based on SCr of 0.96 mg/dL). Liver Function Tests: Recent Labs  Lab 11/07/17 1302 11/10/17 0236  AST 18  --   ALT 12*  --   ALKPHOS 46  --   BILITOT 0.4  --   PROT 6.9  --   ALBUMIN 3.8 2.9*   No results for input(s): LIPASE, AMYLASE in the last 168 hours. Recent Labs  Lab 11/07/17 1528  AMMONIA 59*   Coagulation Profile: No results for input(s): INR, PROTIME in the  last 168 hours. Cardiac Enzymes: No results for input(s): CKTOTAL, CKMB, CKMBINDEX, TROPONINI in the last 168 hours. BNP (last 3 results) No results for input(s): PROBNP in the last 8760 hours. HbA1C: No results for input(s): HGBA1C in the last 72 hours. CBG: Recent Labs  Lab 11/07/17 1302 11/07/17 1420 11/07/17 1532  GLUCAP 61* 64* 124*   Lipid Profile: No results for input(s): CHOL, HDL, LDLCALC, TRIG, CHOLHDL, LDLDIRECT in the last 72 hours. Thyroid Function Tests: No results for input(s): TSH, T4TOTAL, FREET4, T3FREE, THYROIDAB in the last 72 hours. Anemia Panel: No results for input(s): VITAMINB12, FOLATE, FERRITIN, TIBC, IRON, RETICCTPCT in the last 72 hours. Urine analysis:    Component Value Date/Time   COLORURINE YELLOW 11/07/2017 1833   APPEARANCEUR CLOUDY (A) 11/07/2017 1833   LABSPEC 1.019 11/07/2017 1833   PHURINE 7.0 11/07/2017 1833   GLUCOSEU NEGATIVE 11/07/2017 1833   HGBUR NEGATIVE 11/07/2017 1833   BILIRUBINUR NEGATIVE 11/07/2017 1833   KETONESUR NEGATIVE 11/07/2017 1833   PROTEINUR NEGATIVE 11/07/2017 1833   NITRITE NEGATIVE 11/07/2017 1833   LEUKOCYTESUR NEGATIVE 11/07/2017 1833   Sepsis Labs: @LABRCNTIP (procalcitonin:4,lacticidven:4)  )No results found for this or any previous visit (from the past 240 hour(s)).       Radiology Studies: No results found.      Scheduled Meds: . amLODipine  5 mg Oral QPC breakfast  . atorvastatin  40 mg Oral QPC breakfast  . divalproex  1,000 mg Oral QHS  . divalproex  500 mg Oral Daily  . docusate sodium  100 mg Oral BID  . enoxaparin (LOVENOX) injection  40 mg Subcutaneous Q24H  . fesoterodine  8 mg Oral QHS  . pantoprazole  40 mg Oral Daily  . potassium chloride  40 mEq Oral Once  . topiramate  100 mg Oral BID   Continuous Infusions:    LOS: 2 days    Time spent: 32 Minutes.    Dana Allan, MD  [facility] Pager #: [phone] 7PM-7AM contact night coverage as above

## 2017-11-10 NOTE — Clinical Social Work Note (Signed)
Clinical Social Work Assessment  Patient Details  Name: [name] MRN: [id number] Date of Birth: 01/04/1951  Date of referral:  11/10/17               Reason for consult:  Discharge Planning                Permission sought to share information with:  Family Supports Permission granted to share information::     Name::     [name]  Agency::  snf  Relationship::  spouse  Contact Information:  [phone]  Housing/Transportation Living arrangements for the past 2 months:  [place] of Information:  Patient, Spouse Patient Interpreter Needed:  None Criminal Activity/Legal Involvement Pertinent to Current Situation/Hospitalization:  No - Comment as needed Significant Relationships:  Spouse Lives with:  Spouse Do you feel safe going back to the place where you live?  No Need for family participation in patient care:  Yes (Comment)  Care giving concerns: Patients spouse at bedside.  Social Worker assessment / plan:  CSW met patient at bedside with spouse present to discuss patients disposition plan. Patient seem slightly confused stating she has been in the hospital for 3 weeks when instead she has only been at hospital for 3 days. CSW spoke to spouse and patient in regards to SNF placement. Family agreeable but would only prefer 2 facilities [facility] and [facility]). Spouse stated he would like to take patient home while waiting for SNF placement, CSW informed family that is not how it works. Spouse stated he is tired of waiting and if patients gets dc today without placement he will take pt home. CSW to follow up with patient/family once bed offers are available   Employment status:  Retired Forensic scientist:  Medicare PT Recommendations:  [place] / Referral to community resources:  [place]  Patient/Family's Response to care:  Family agreeable to dc to facility but stated if patient does not like the facility  they will take her out against medical advice   Patient/Family's Understanding of and Emotional Response to Diagnosis, Current Treatment, and Prognosis: Family seems very eager to take patients home. Family does not seem to understand the safety concerns medical team has for patient   Emotional Assessment Appearance:  Appears stated age Attitude/Demeanor/Rapport:  Other Affect (typically observed):  Calm Orientation:  Oriented to Self, Oriented to Situation, Oriented to Place, Oriented to  Time Alcohol / Substance use:  Not Applicable Psych involvement (Current and /or in the community):  No (Comment)  Discharge Needs  Concerns to be addressed:  No discharge needs identified Readmission within the last 30 days:  No Current discharge risk:  None Barriers to Discharge:  No Barriers Identified   Wende Neighbors, LCSW 11/10/2017, 2:37 PM

## 2017-11-10 NOTE — Progress Notes (Signed)
Around 0106 pt called to the front desk for tylenol for headache, almost two hours after  PO tylenol given  pt husband called to the front desk that wife said she doesn't feel good when to pt room per pt having generalised body ache, the tylenol was not effective, vital signs stable,  Gave her one tablet of vicodin, pain was re- assessed and per pt feels better will continue to monitor pt

## 2017-11-11 DIAGNOSIS — G9340 Encephalopathy, unspecified: Secondary | ICD-10-CM

## 2017-11-11 MED ORDER — POTASSIUM CHLORIDE CRYS ER 20 MEQ PO TBCR
40.0000 meq | EXTENDED_RELEASE_TABLET | Freq: Once | ORAL | Status: AC
  Administered 2017-11-11: 40 meq via ORAL
  Filled 2017-11-11: qty 2

## 2017-11-11 NOTE — Discharge Summary (Signed)
Physician Discharge Summary  BELEM HINTZE FYB:[id number] DOB: 1951/01/30 DOA: 11/07/2017  PCP: Carol Ada, MD  Admit date: 11/07/2017 Discharge date: 11/11/2017  Recommendations for Outpatient Follow-up:  1. Pt will need to follow up with PCP in 1-2 weeks post discharge 2. Please obtain BMP to evaluate electrolytes and kidney function, K and Mg level  3. Please also check CBC to evaluate Hg and Hct levels 4. Please note that pt was advised to hold Benicar on discharge until seen by PCP and ensured that BP can tolerate addition of this medications and also ensuring that Cr is stable prior to start  5. Pt also advised to hold off on placing Butrans patch and using Zanaflex until make sure her mental status remains clear and to minimize risk of falls   Discharge Diagnoses:  Principal Problem:   Acute encephalopathy Active Problems:   Polypharmacy   Seizure disorder ([place])   Dementia   Essential hypertension   Headaches, cluster   AKI (acute kidney injury) ([place])   Altered mental status  Discharge Condition: Stable  Diet recommendation: Heart healthy diet discussed in details   History of present illness:  Pt is 66 yo female with known history of seizures, migraine headaches, HTN, HLD, ? Mild dementia, presented to [facility] ED after she has developed unsteady gait. Per report, she was started on a new medication 4 days prior to this admission at the urgent care center. It was reported that on admission, pt was poor historian and details of the unsteady gait and confusion are not clear as pt is on multiple mind altering medications. Pt was evaluated by PT and SNF was recommended but pt has declined placement and chose to go home.    Assessment & Plan:   Principal Problem:   Acute encephalopathy, metabolic and in the setting of polypharmacy - mental status clear this am - MRI unrevealing  - discussed with pt and her husband recommendations based on PT eval but pt declines  placement - wants to go home today   Active Problems:   Polypharmacy - discussed with pt and her husband - pt says she will discuss any further changes in her medications with her PCP - pt advised not to use Butrans patch and Zanaflex until seen by PCP    Seizure disorder ([place]) - no seizures inpatient     Essential hypertension - reasonable inpatient control but still on low end of normal - holding Benicar until pt seen by PCP    AKI (acute kidney injury) ([place]) - resolved with IVF  DVT prophylaxis: Hominy Lovenox Code Status: Full Family Communication: Husband Disposition Plan: wants to go home, husband at bedside and agrees  Consultants:   None.  Procedures:   None  Antimicrobials:   None   Procedures/Studies: Ct Head Wo Contrast  Result Date: 11/07/2017 CLINICAL DATA:  Altered level of consciousness EXAM: CT HEAD WITHOUT CONTRAST TECHNIQUE: Contiguous axial images were obtained from the base of the skull through the vertex without intravenous contrast. COMPARISON:  12/21/2015, 12/25/2013 FINDINGS: Brain: No acute territorial infarction, hemorrhage or intracranial mass is visualized. Mild atrophy. Mildly prominent ventricles likely due to atrophy. Vascular: No hyperdense vessels.  Carotid vessel calcification Skull: Normal. Negative for fracture or focal lesion. Sinuses/Orbits: No acute finding. Other: None IMPRESSION: No CT evidence for acute intracranial abnormality. Electronically Signed   By: Donavan Foil M.D.   On: 11/07/2017 16:28   Mr Brain Wo Contrast  Result Date: 11/08/2017 CLINICAL DATA:  Loss  of strength and energy with somnolence. Some confusion. History of meningioma. EXAM: MRI HEAD WITHOUT CONTRAST TECHNIQUE: Multiplanar, multiecho pulse sequences of the brain and surrounding structures were obtained without intravenous contrast. COMPARISON:  CT 11/07/2017.  MRI 12/21/2015. FINDINGS: Brain: Diffusion imaging does not show any acute or subacute infarction.  There is mild age related volume loss, not advanced for age. No evidence of small-vessel disease or large vessel infarction. No intra-axial mass lesion, hemorrhage, hydrocephalus or extra-axial fluid collection. This examination was done without contrast. Left-sided planum sphenoidale meningioma appears similar, measuring approximately 11 x 7 mm. Left frontoparietal convexity meningioma appears similar, measuring approximately 1 cm in size. Neither has any significant mass-effect upon the brain. Vascular: Major vessels at the base of the brain show flow. Skull and upper cervical spine: Negative Sinuses/Orbits: Clear/normal Other: None IMPRESSION: No acute or reversible finding. No brain parenchymal pathology to explain the clinical presentation. Two small meningiomas, 1 along the left planum sphenoidale on the other at the left frontoparietal vertex convexity. No visible change since the study of 12/26, though today's study is done without contrast. No significant mass-effect upon the brain. Electronically Signed   By: Nelson Chimes M.D.   On: 11/08/2017 09:36     Discharge Exam: Vitals:   11/11/17 0425 11/11/17 0557  BP: (!) 149/73 (!) 126/48  Pulse: 84 70  Resp: 16 16  Temp: 98.1 F (36.7 C) 98.6 F (37 C)  SpO2: 100% 98%   Vitals:   11/10/17 1806 11/10/17 2159 11/11/17 0425 11/11/17 0557  BP: (!) 153/80 (!) 169/78 (!) 149/73 (!) 126/48  Pulse: 88 84 84 70  Resp: 16 17 16 16   Temp: 98.4 F (36.9 C) 98.4 F (36.9 C) 98.1 F (36.7 C) 98.6 F (37 C)  TempSrc: Oral Oral Oral Oral  SpO2: 100% 99% 100% 98%  Weight:      Height:        General: Pt is alert, follows commands appropriately, not in acute distress Cardiovascular: Regular rate and rhythm, S1/S2 +, no murmurs, no rubs, no gallops Respiratory: Clear to auscultation bilaterally, no wheezing, no crackles, no rhonchi Abdominal: Soft, non tender, non distended, bowel sounds +, no guarding   Discharge Instructions  Discharge  Instructions    Diet - low sodium heart healthy   Complete by:  As directed    Increase activity slowly   Complete by:  As directed      Allergies as of 11/11/2017      Reactions   Nsaids Other (See Comments)   Can cause ulcers   Aspirin Nausea Only, Other (See Comments)   Could cause ulcers   Tolmetin Other (See Comments)   Can cause ulcers      Medication List    STOP taking these medications   BUTRANS 7.5 MCG/HR Ptwk Generic drug:  Buprenorphine   famotidine 20 MG tablet Commonly known as:  PEPCID   olmesartan 40 MG tablet Commonly known as:  BENICAR   tiZANidine 2 MG tablet Commonly known as:  ZANAFLEX     TAKE these medications   acetaminophen 500 MG tablet Commonly known as:  TYLENOL Take 1,000 mg by mouth every 6 (six) hours as needed for mild pain.   amLODipine 5 MG tablet Commonly known as:  NORVASC Take 5 mg by mouth daily after breakfast.   atorvastatin 40 MG tablet Commonly known as:  LIPITOR Take 40 mg by mouth daily after breakfast.   CALCIUM 600 + D 600-200 MG-UNIT Tabs Generic  drug:  Calcium Carb-Cholecalciferol Take 1 tablet by mouth daily.   carbamazepine 200 MG 12 hr tablet Commonly known as:  TEGRETOL XR Take 200 mg at bedtime by mouth.   conjugated estrogens vaginal cream Commonly known as:  PREMARIN Place 1.5 g daily vaginally.   divalproex 500 MG 24 hr tablet Commonly known as:  DEPAKOTE ER TAKE 1 TABLET BY MOUTH EVERY MORNING AND TAKE 2 TABLETS BY MOUTH EVERY EVENING What changed:  See the new instructions.   esomeprazole 40 MG capsule Commonly known as:  NEXIUM Take 40 mg by mouth daily after breakfast.   ferrous sulfate 325 (65 FE) MG tablet Take 325 mg by mouth daily with breakfast.   magnesium oxide 400 MG tablet Commonly known as:  MAG-OX Take 400 mg by mouth 2 (two) times daily.   multivitamin with minerals Tabs tablet Take 1 tablet by mouth daily.   MYRBETRIQ 50 MG Tb24 tablet Generic drug:  mirabegron  ER Take 50 mg daily by mouth.   topiramate 100 MG tablet Commonly known as:  TOPAMAX Take 100 mg 2 (two) times daily by mouth.   TOVIAZ 8 MG Tb24 tablet Generic drug:  fesoterodine Take 8 mg by mouth at bedtime.   traMADol 50 MG tablet Commonly known as:  ULTRAM Take 1 tablet (50 mg total) by mouth every 6 (six) hours as needed.   vitamin B-12 100 MCG tablet Commonly known as:  CYANOCOBALAMIN Take 1,000 mcg daily by mouth.            Durable Medical Equipment  (From admission, onward)        Start     Ordered   11/09/17 1457  For home use only DME Walker rolling  Once    Question:  Patient needs a walker to treat with the following condition  Answer:  Acute encephalopathy   11/09/17 1457   11/09/17 1457  For home use only DME 3 n 1  Once     11/09/17 1457       Follow-up Information    Carol Ada, MD Follow up.   Specialty:  Family Medicine Contact information: [address] [place] [address] [phone]          The results of significant diagnostics from this hospitalization (including imaging, microbiology, ancillary and laboratory) are listed below for reference.    Microbiology: No results found for this or any previous visit (from the past 240 hour(s)).   Labs: Basic Metabolic Panel: Recent Labs  Lab 11/07/17 1302 11/08/17 0432 11/10/17 0236  NA 139 139 140  K 3.3* 3.5 3.4*  CL 107 106 109  CO2 24 26 23   GLUCOSE 92 106* 114*  BUN 26* 20 16  CREATININE 1.31* 1.21* 0.96  CALCIUM 9.4 8.9 9.0  PHOS  --   --  3.3   Liver Function Tests: Recent Labs  Lab 11/07/17 1302 11/10/17 0236  AST 18  --   ALT 12*  --   ALKPHOS 46  --   BILITOT 0.4  --   PROT 6.9  --   ALBUMIN 3.8 2.9*    Recent Labs  Lab 11/07/17 1528  AMMONIA 59*   CBC: Recent Labs  Lab 11/07/17 1302 11/08/17 0432  WBC 11.6* 10.5  HGB 14.0 12.8  HCT 42.8 39.8  MCV 94.5 94.1  PLT 259 237   CBG: Recent Labs  Lab 11/07/17 1302  11/07/17 1420 11/07/17 1532  GLUCAP 61* 64* 124*   SIGNED:  Time coordinating discharge: 60 minutes  Faye Ramsay, MD  [facility] 11/11/2017, 9:48 AM Pager [phone]  If 7PM-7AM, please contact night-coverage [email or web address] Password [email or web address]

## 2017-11-11 NOTE — Progress Notes (Signed)
[name] to be D/C'd home per MD order. Discussed with the patient and husband and all questions fully answered.  Allergies as of 11/11/2017      Reactions   Nsaids Other (See Comments)   Can cause ulcers   Aspirin Nausea Only, Other (See Comments)   Could cause ulcers   Tolmetin Other (See Comments)   Can cause ulcers      Medication List    STOP taking these medications   BUTRANS 7.5 MCG/HR Ptwk Generic drug:  Buprenorphine   famotidine 20 MG tablet Commonly known as:  PEPCID   olmesartan 40 MG tablet Commonly known as:  BENICAR   tiZANidine 2 MG tablet Commonly known as:  ZANAFLEX     TAKE these medications   acetaminophen 500 MG tablet Commonly known as:  TYLENOL Take 1,000 mg by mouth every 6 (six) hours as needed for mild pain.   amLODipine 5 MG tablet Commonly known as:  NORVASC Take 5 mg by mouth daily after breakfast.   atorvastatin 40 MG tablet Commonly known as:  LIPITOR Take 40 mg by mouth daily after breakfast.   CALCIUM 600 + D 600-200 MG-UNIT Tabs Generic drug:  Calcium Carb-Cholecalciferol Take 1 tablet by mouth daily.   carbamazepine 200 MG 12 hr tablet Commonly known as:  TEGRETOL XR Take 200 mg at bedtime by mouth.   conjugated estrogens vaginal cream Commonly known as:  PREMARIN Place 1.5 g daily vaginally.   divalproex 500 MG 24 hr tablet Commonly known as:  DEPAKOTE ER TAKE 1 TABLET BY MOUTH EVERY MORNING AND TAKE 2 TABLETS BY MOUTH EVERY EVENING What changed:  See the new instructions.   esomeprazole 40 MG capsule Commonly known as:  NEXIUM Take 40 mg by mouth daily after breakfast.   ferrous sulfate 325 (65 FE) MG tablet Take 325 mg by mouth daily with breakfast.   magnesium oxide 400 MG tablet Commonly known as:  MAG-OX Take 400 mg by mouth 2 (two) times daily.   multivitamin with minerals Tabs tablet Take 1 tablet by mouth daily.   MYRBETRIQ 50 MG Tb24 tablet Generic drug:  mirabegron ER Take 50 mg daily by  mouth.   topiramate 100 MG tablet Commonly known as:  TOPAMAX Take 100 mg 2 (two) times daily by mouth.   TOVIAZ 8 MG Tb24 tablet Generic drug:  fesoterodine Take 8 mg by mouth at bedtime.   traMADol 50 MG tablet Commonly known as:  ULTRAM Take 1 tablet (50 mg total) by mouth every 6 (six) hours as needed.   vitamin B-12 100 MCG tablet Commonly known as:  CYANOCOBALAMIN Take 1,000 mcg daily by mouth.            Durable Medical Equipment  (From admission, onward)        Start     Ordered   11/09/17 1457  For home use only DME Walker rolling  Once    Question:  Patient needs a walker to treat with the following condition  Answer:  Acute encephalopathy   11/09/17 1457   11/09/17 1457  For home use only DME 3 n 1  Once     11/09/17 1457      VVS, Skin clean, dry and intact without evidence of skin break down, no evidence of skin tears noted.  IV catheter discontinued intact. Site without signs and symptoms of complications. Dressing and pressure applied.  An After Visit Summary was printed and given to the patient.  Patient  escorted via [place], and D/C home via private auto.  [name]  11/11/2017 1:56 PM

## 2017-11-11 NOTE — Progress Notes (Signed)
Physical Therapy Treatment Patient Details Name: [name] MRN: [id number] DOB: 1951-09-28 Today's Date: 11/11/2017    History of Present Illness Pt is a 66 y/o female admitted secondary to AMS/acute encephalopathy. MRI was negative. PMH including but not limited dementia and seizures.     PT Comments    Pt showing progress towards goals, requiring less assist with mobility and ambulated 50 ft in hall with min guard. Pt has refused SNF placement. Expected to d/c home this PM with husband available to provide 24/7 support. Discussed d/c plan with Irwin Brakeman, PT. HHPT to follow up to maximize pt's safety with mobility and functional independence.   Follow Up Recommendations  Home health PT     Equipment Recommendations  None recommended by PT    Recommendations for Other Services       Precautions / Restrictions Precautions Precautions: Fall Restrictions Weight Bearing Restrictions: No    Mobility  Bed Mobility               General bed mobility comments: in chair on arrival  Transfers Overall transfer level: Needs assistance Equipment used: Rolling walker (2 wheeled) Transfers: Sit to/from Stand Sit to Stand: Min guard         General transfer comment: Min guard for safety. Cueing for hand placement  Ambulation/Gait Ambulation/Gait assistance: Min assist;Min guard Ambulation Distance (Feet): 50 Feet Assistive device: Rolling walker (2 wheeled) Gait Pattern/deviations: Step-through pattern Gait velocity: decreased Gait velocity interpretation: Below normal speed for age/gender General Gait Details: Pt with slow, mildly unsteady gait. No overt LOB. Min a provided for RW management during turns.   Stairs            Wheelchair Mobility    Modified Rankin (Stroke Patients Only)       Balance Overall balance assessment: Needs assistance Sitting-balance support: Feet supported Sitting balance-Leahy Scale: Fair     Standing balance  support: During functional activity;Single extremity supported Standing balance-Leahy Scale: Poor                              Cognition Arousal/Alertness: Awake/alert Behavior During Therapy: Flat affect Overall Cognitive Status: Impaired/Different from baseline Area of Impairment: Safety/judgement;Problem solving                         Safety/Judgement: Decreased awareness of deficits;Decreased awareness of safety   Problem Solving: Slow processing;Decreased initiation;Difficulty sequencing;Requires verbal cues;Requires tactile cues        Exercises      General Comments        Pertinent Vitals/Pain Pain Assessment: No/denies pain    Home Living                      Prior Function            PT Goals (current goals can now be found in the care plan section) Acute Rehab PT Goals Patient Stated Goal: return to independence PT Goal Formulation: With patient/family Time For Goal Achievement: 11/22/17 Potential to Achieve Goals: Fair Progress towards PT goals: Progressing toward goals    Frequency    Min 2X/week      PT Plan Discharge plan needs to be updated    Co-evaluation              AM-PAC PT "6 Clicks" Daily Activity  Outcome Measure  Difficulty turning over in bed (including adjusting bedclothes,  sheets and blankets)?: A Lot Difficulty moving from lying on back to sitting on the side of the bed? : Unable Difficulty sitting down on and standing up from a chair with arms (e.g., wheelchair, bedside commode, etc,.)?: Unable Help needed moving to and from a bed to chair (including a wheelchair)?: A Little Help needed walking in hospital room?: A Little Help needed climbing 3-5 steps with a railing? : A Lot 6 Click Score: 12    End of Session Equipment Utilized During Treatment: Gait belt Activity Tolerance: Patient tolerated treatment well Patient left: with family/visitor present;in chair Nurse Communication:  Mobility status PT Visit Diagnosis: Other abnormalities of gait and mobility (R26.89)     Time: 1216-1229 PT Time Calculation (min) (ACUTE ONLY): 13 min  Charges:  $Gait Training: 8-22 mins                    G Codes:       Benjiman Core, [place] Pager [phone] Acute Rehab   Allena Katz 11/11/2017, 1:38 PM

## 2017-11-11 NOTE — Discharge Instructions (Signed)

## 2017-11-11 NOTE — Progress Notes (Signed)
CSW received consult regarding PT recommendation of SNF at discharge.  Patient and husband now refusing SNF and would like home health. RNCM aware.  CSW signing off.   Percell Locus Dava Rensch LCSWA [phone]

## 2017-11-11 NOTE — Care Management Note (Signed)
Case Management Note  Patient Details  Name: TEA COLLUMS MRN: [id number] Date of Birth: 11-26-1951  Subjective/Objective:           Admitted with  Acute Encephalopathy.              PCP: Carol Ada  Action/Plan: Plan is to d/c to home today. Declined SNF placement. Home health services in place @ d/c.  Expected Discharge Date:  11/11/17               Expected Discharge Plan:  [facility]  In-House Referral:     Discharge planning Services  CM Consult  Post Acute Care Choice:    Choice offered to:  Patient, Spouse  DME Arranged:   [facility] DME Agency:   3 in1/bsc, rolling walker  HH Arranged:  RN, PT, OT, Nurse's Aide [place] Agency:  [facility]  Status of Service:  Completed, signed off  If discussed at [place] of Stay Meetings, dates discussed:    Additional Comments:  Sharin Mons, RN 11/11/2017, 10:21 AM

## 2017-11-12 ENCOUNTER — Encounter (HOSPITAL_COMMUNITY): Payer: Self-pay

## 2017-11-12 ENCOUNTER — Emergency Department (HOSPITAL_COMMUNITY): Payer: Medicare Other

## 2017-11-12 ENCOUNTER — Emergency Department (HOSPITAL_COMMUNITY)
Admission: EM | Admit: 2017-11-12 | Discharge: 2017-11-12 | Disposition: A | Discharge status: Home / Self Care | Payer: Medicare Other | Attending: Emergency Medicine | Admitting: Emergency Medicine

## 2017-11-12 DIAGNOSIS — R109 Unspecified abdominal pain: Secondary | ICD-10-CM | POA: Diagnosis not present

## 2017-11-12 DIAGNOSIS — Z79899 Other long term (current) drug therapy: Secondary | ICD-10-CM | POA: Insufficient documentation

## 2017-11-12 DIAGNOSIS — L509 Urticaria, unspecified: Secondary | ICD-10-CM | POA: Diagnosis not present

## 2017-11-12 DIAGNOSIS — I10 Essential (primary) hypertension: Secondary | ICD-10-CM | POA: Diagnosis not present

## 2017-11-12 DIAGNOSIS — F0390 Unspecified dementia without behavioral disturbance: Secondary | ICD-10-CM | POA: Diagnosis not present

## 2017-11-12 DIAGNOSIS — R21 Rash and other nonspecific skin eruption: Secondary | ICD-10-CM | POA: Diagnosis present

## 2017-11-12 DIAGNOSIS — R1032 Left lower quadrant pain: Secondary | ICD-10-CM | POA: Diagnosis not present

## 2017-11-12 LAB — CBC WITH DIFFERENTIAL/PLATELET
Basophils Absolute: 0 10*3/uL (ref 0.0–0.1)
Basophils Relative: 0 %
Eosinophils Absolute: 0.3 10*3/uL (ref 0.0–0.7)
Eosinophils Relative: 3 %
HCT: 45.9 % (ref 36.0–46.0)
Hemoglobin: 15.6 g/dL — ABNORMAL HIGH (ref 12.0–15.0)
Lymphocytes Relative: 22 %
Lymphs Abs: 2.6 10*3/uL (ref 0.7–4.0)
MCH: 32.3 pg (ref 26.0–34.0)
MCHC: 34 g/dL (ref 30.0–36.0)
MCV: 95 fL (ref 78.0–100.0)
Monocytes Absolute: 1 10*3/uL (ref 0.1–1.0)
Monocytes Relative: 9 %
Neutro Abs: 7.7 10*3/uL (ref 1.7–7.7)
Neutrophils Relative %: 66 %
Platelets: 296 10*3/uL (ref 150–400)
RBC: 4.83 MIL/uL (ref 3.87–5.11)
RDW: 13.1 % (ref 11.5–15.5)
WBC: 11.6 10*3/uL — ABNORMAL HIGH (ref 4.0–10.5)

## 2017-11-12 LAB — COMPREHENSIVE METABOLIC PANEL
ALT: 18 U/L (ref 14–54)
AST: 24 U/L (ref 15–41)
Albumin: 3.8 g/dL (ref 3.5–5.0)
Alkaline Phosphatase: 53 U/L (ref 38–126)
Anion gap: 9 (ref 5–15)
BUN: 21 mg/dL — ABNORMAL HIGH (ref 6–20)
CO2: 24 mmol/L (ref 22–32)
Calcium: 9.6 mg/dL (ref 8.9–10.3)
Chloride: 110 mmol/L (ref 101–111)
Creatinine, Ser: 1.3 mg/dL — ABNORMAL HIGH (ref 0.44–1.00)
GFR calc Af Amer: 48 mL/min — ABNORMAL LOW (ref >60)
GFR calc non Af Amer: 41 mL/min — ABNORMAL LOW (ref >60)
Glucose, Bld: 104 mg/dL — ABNORMAL HIGH (ref 65–99)
Potassium: 4.3 mmol/L (ref 3.5–5.1)
Sodium: 143 mmol/L (ref 135–145)
Total Bilirubin: 0.1 mg/dL — ABNORMAL LOW (ref 0.3–1.2)
Total Protein: 7.1 g/dL (ref 6.5–8.1)

## 2017-11-12 LAB — URINALYSIS, ROUTINE W REFLEX MICROSCOPIC
Glucose, UA: NEGATIVE mg/dL
Hgb urine dipstick: NEGATIVE
Ketones, ur: 5 mg/dL — AB
Nitrite: NEGATIVE
Protein, ur: 30 mg/dL — AB
RBC / HPF: NONE SEEN RBC/hpf (ref 0–5)
Specific Gravity, Urine: 1.03 (ref 1.005–1.030)
pH: 6 (ref 5.0–8.0)

## 2017-11-12 LAB — LIPASE, BLOOD: Lipase: 26 U/L (ref 11–51)

## 2017-11-12 MED ORDER — FAMOTIDINE IN NACL 20-0.9 MG/50ML-% IV SOLN
20.0000 mg | Freq: Once | INTRAVENOUS | Status: AC
  Administered 2017-11-12: 20 mg via INTRAVENOUS
  Filled 2017-11-12: qty 50

## 2017-11-12 MED ORDER — METHYLPREDNISOLONE SODIUM SUCC 125 MG IJ SOLR
125.0000 mg | Freq: Once | INTRAMUSCULAR | Status: AC
  Administered 2017-11-12: 125 mg via INTRAVENOUS
  Filled 2017-11-12: qty 2

## 2017-11-12 MED ORDER — DIPHENHYDRAMINE HCL 50 MG/ML IJ SOLN
25.0000 mg | Freq: Once | INTRAMUSCULAR | Status: AC
  Administered 2017-11-12: 25 mg via INTRAVENOUS
  Filled 2017-11-12: qty 1

## 2017-11-12 MED ORDER — FAMOTIDINE 20 MG PO TABS: 20.0000 mg | ORAL_TABLET | Freq: Two times a day (BID) | ORAL | 0 refills | Status: DC

## 2017-11-12 MED ORDER — MORPHINE SULFATE (PF) 4 MG/ML IV SOLN
4.0000 mg | Freq: Once | INTRAVENOUS | Status: AC
  Administered 2017-11-12: 4 mg via INTRAVENOUS
  Filled 2017-11-12: qty 1

## 2017-11-12 MED ORDER — PREDNISONE 10 MG PO TABS: 20.0000 mg | ORAL_TABLET | Freq: Every day | ORAL | 0 refills | Status: DC

## 2017-11-12 MED ORDER — DIPHENHYDRAMINE HCL 25 MG PO CAPS: 25.0000 mg | ORAL_CAPSULE | Freq: Four times a day (QID) | ORAL | 0 refills | Status: DC | PRN

## 2017-11-12 NOTE — ED Notes (Signed)
Patient given sprite to drink. 

## 2017-11-12 NOTE — ED Triage Notes (Signed)
Pt was recently in the hospital for AMS, she was released earlier today Pt complains of hives on her abdomen and back since she has been home

## 2017-11-12 NOTE — ED Provider Notes (Signed)
[facility] DEPT Provider Note   CSN: [id number] Arrival date & time: 11/12/17  0411     History   Chief Complaint Chief Complaint  Patient presents with  . Urticaria  . Abdominal Pain    HPI [name] is a 66 y.o. female.  HPI Patient is a 66 year old female presents the emergency department with a rash of her back that itches.  It appears to be hives.  No new medications.  No history of allergies.  No new foods.  She has not tried any medication prior to arrival.  She reports some crampy abdominal pain without nausea vomiting or diarrhea.  No fevers or chills.  She does have a history of incisional hernia repair status post mesh placement.  Her last surgery was OCtober 2017. No fevers. Symptoms are moderate in severity   Past Medical History:  Diagnosis Date  . Dementia   . Depression   . Epilepsy, grand mal ([place])    "last seizure ~ 2001" (06/16/2015)  . GERD (gastroesophageal reflux disease)   . High cholesterol   . Hypertension   . Migraine    "just about qd";  treated by Dr. [name] (10/15/2016)  . Pre-diabetes    she reports that she never took meds. for prediabetes, states MD has told her that she has corrected her situation with diet   . Seizures ([place])    "at one time; stopped in the 1990s" (10/15/2016)  . Stress at home    Pt. reports that she has a lot of personal stress & she is aware that it might being playing a part in her Migraine heaaches      Patient Active Problem List   Diagnosis Date Noted  . Altered mental status 11/08/2017  . Acute encephalopathy 11/07/2017  . Seizure disorder ([place]) 11/07/2017  . Dementia 11/07/2017  . Essential hypertension 11/07/2017  . Headaches, cluster 11/07/2017  . AKI (acute kidney injury) ([place]) 11/07/2017  . Polypharmacy 05/15/2017  . Meningioma ([place]) 01/09/2016  . Depression 11/08/2015  . Abnormal head movements 11/08/2015  . Somnolence, daytime 07/14/2015  . Incisional hernia  06/16/2015  . Chronic headaches 04/01/2013    Past Surgical History:  Procedure Laterality Date  . HERNIA REPAIR    . INCISIONAL HERNIA REPAIR N/A 06/16/2015   Procedure: LAPAROSCOPIC INCISIONAL HERNIA WITH MESH;  Surgeon: Coralie Keens, MD;  Location: [facility];  Service: General;  Laterality: N/A;  . [place]  10/15/2016  . INCISIONAL HERNIA REPAIR N/A 10/15/2016   Procedure: OPEN INCISIONAL HERNIA REPAIR WITH MESH;  Surgeon: Coralie Keens, MD;  Location: [facility];  Service: General;  Laterality: N/A;  . INSERTION OF MESH N/A 06/16/2015   Procedure: INSERTION OF MESH;  Surgeon: Coralie Keens, MD;  Location: [facility];  Service: General;  Laterality: N/A;  . VAGINAL HYSTERECTOMY  ~ 1992    OB History    No data available       Home Medications    Prior to Admission medications   Medication Sig Start Date End Date Taking? Authorizing Provider  acetaminophen (TYLENOL) 500 MG tablet Take 1,000 mg by mouth every 6 (six) hours as needed for mild pain.   Yes [provider]  amLODipine (NORVASC) 5 MG tablet Take 5 mg by mouth daily after breakfast.    Yes [provider]  atorvastatin (LIPITOR) 40 MG tablet Take 40 mg by mouth daily after breakfast.    Yes [provider]  Calcium Carb-Cholecalciferol (CALCIUM 600 +  D) 600-200 MG-UNIT TABS Take 1 tablet by mouth daily.   Yes [provider]  carbamazepine (TEGRETOL XR) 200 MG 12 hr tablet Take 200 mg at bedtime by mouth. 10/23/17  Yes [provider]  conjugated estrogens (PREMARIN) vaginal cream Place 1.5 g daily vaginally. 08/27/17  Yes [provider]  divalproex (DEPAKOTE ER) 500 MG 24 hr tablet TAKE 1 TABLET BY MOUTH EVERY MORNING AND TAKE 2 TABLETS BY MOUTH EVERY EVENING Patient taking differently: TAKE 500 mg TABLET BY MOUTH EVERY MORNING AND TAKE 1000 mg  TABLETS BY MOUTH EVERY EVENING 10/11/16  Yes Marcial Pacas, MD  esomeprazole (NEXIUM) 40 MG capsule Take 40 mg by  mouth daily after breakfast.  07/13/15  Yes [provider]  ferrous sulfate 325 (65 FE) MG tablet Take 325 mg by mouth daily with breakfast.   Yes [provider]  magnesium oxide (MAG-OX) 400 MG tablet Take 400 mg by mouth 2 (two) times daily.    Yes [provider]  Multiple Vitamin (MULTIVITAMIN WITH MINERALS) TABS Take 1 tablet by mouth daily.   Yes [provider]  MYRBETRIQ 50 MG TB24 tablet Take 50 mg daily by mouth. 09/23/17  Yes [provider]  topiramate (TOPAMAX) 100 MG tablet Take 100 mg 2 (two) times daily by mouth. 07/08/14  Yes [provider]  TOVIAZ 8 MG TB24 tablet Take 8 mg by mouth at bedtime.  10/02/16  Yes [provider]  traMADol (ULTRAM) 50 MG tablet Take 1 tablet (50 mg total) by mouth every 6 (six) hours as needed. Patient taking differently: Take 50 mg every 6 (six) hours as needed by mouth for moderate pain.  05/15/17  Yes Marcial Pacas, MD  vitamin B-12 (CYANOCOBALAMIN) 100 MCG tablet Take 1,000 mcg daily by mouth.   Yes [provider]  diphenhydrAMINE (BENADRYL) 25 mg capsule Take 1 capsule (25 mg total) by mouth every 6 (six) hours as needed. 11/12/17   Jola Schmidt, MD  famotidine (PEPCID) 20 MG tablet Take 1 tablet (20 mg total) by mouth 2 (two) times daily. 11/12/17   Jola Schmidt, MD  predniSONE (DELTASONE) 10 MG tablet Take 2 tablets (20 mg total) by mouth daily. 11/12/17   Jola Schmidt, MD    Family History Family History  Problem Relation Age of Onset  . Lung cancer Mother   . Kidney failure Father   . Hypertension Other     Social History Social History   Tobacco Use  . Smoking status: Never Smoker  . Smokeless tobacco: Never Used  Substance Use Topics  . Alcohol use: No    Alcohol/week: 0.0 oz  . Drug use: No     Allergies   Nsaids; Aspirin; and Tolmetin   Review of Systems Review of Systems  All other systems reviewed and are negative.    Physical  Exam Updated Vital Signs BP 101/60 (BP Location: Left Arm)   Pulse 98   Temp 98.7 F (37.1 C) (Oral)   Resp 18   SpO2 100%   Physical Exam  Constitutional: She is oriented to person, place, and time. She appears well-developed and well-nourished. No distress.  HENT:  Head: Normocephalic and atraumatic.  Eyes: EOM are normal.  Neck: Normal range of motion.  Cardiovascular: Normal rate, regular rhythm and normal heart sounds.  Pulmonary/Chest: Effort normal and breath sounds normal.  Abdominal: Soft. She exhibits no distension. There is no tenderness.  Musculoskeletal: Normal range of motion.  Neurological: She is alert and  oriented to person, place, and time.  Skin: Skin is warm and dry.  Psychiatric: She has a normal mood and affect. Judgment normal.  Nursing note and vitals reviewed.    ED Treatments / Results  Labs (all labs ordered are listed, but only abnormal results are displayed) Labs Reviewed  CBC WITH DIFFERENTIAL/PLATELET - Abnormal; Notable for the following components:      Result Value   WBC 11.6 (*)    Hemoglobin 15.6 (*)    All other components within normal limits  COMPREHENSIVE METABOLIC PANEL - Abnormal; Notable for the following components:   Glucose, Bld 104 (*)    BUN 21 (*)    Creatinine, Ser 1.30 (*)    Total Bilirubin 0.1 (*)    GFR calc non Af Amer 41 (*)    GFR calc Af Amer 48 (*)    All other components within normal limits  URINALYSIS, ROUTINE W REFLEX MICROSCOPIC - Abnormal; Notable for the following components:   Color, Urine AMBER (*)    APPearance CLOUDY (*)    Bilirubin Urine SMALL (*)    Ketones, ur 5 (*)    Protein, ur 30 (*)    Leukocytes, UA MODERATE (*)    Bacteria, UA FEW (*)    Squamous Epithelial / LPF 0-5 (*)    All other components within normal limits  URINE CULTURE  LIPASE, BLOOD    EKG  EKG Interpretation None       Radiology Ct Abdomen Pelvis Wo Contrast  Result Date: 11/12/2017 CLINICAL DATA:   65 year old female with left lower quadrant abdominal pain and hives for 2 days. EXAM: CT ABDOMEN AND PELVIS WITHOUT CONTRAST TECHNIQUE: Multidetector CT imaging of the abdomen and pelvis was performed following the standard protocol without IV contrast. COMPARISON:  CT Abdomen and Pelvis 02/10/2017 and earlier. FINDINGS: Lower chest: Stable and negative. No pericardial or pleural effusion. Hepatobiliary: Small layering gallstones. Mildly distended gallbladder but no pericholecystic inflammation. Stable and negative liver with a small round low-density area in the posterior right hepatic lobe which appears benign. Pancreas: Negative. Spleen: Diminutive, negative. Adrenals/Urinary Tract: Normal adrenal glands. No hydronephrosis or perinephric stranding. Punctate right midpole nephrolithiasis. No left renal calculus. Negative course of both ureters; there are chronic surgical clips along the left pelvic side wall. Unremarkable urinary bladder. Stomach/Bowel: There are postoperative changes to the midline lower abdominal and suprapubic abdominal wall. Recurrent left parasagittal incisional hernia has developed since February and contains a short segment of the sigmoid colon and sigmoid mesenteric. This is similar in size and configuration to that on 01/22/2016. There is surrounding superficial subcutaneous inflammatory stranding (series 2, image 79). There is retained stool throughout the sigmoid colon which does not appear obstructed. There is no free fluid or gas within the hernia sac which measures 4-5 cm diameter. The proximal sigmoid is redundant. The distal sigmoid and rectum are decompressed. Retained stool continues in the left colon, throughout the redundant splenic flexure, transverse colon, and right colon. The cecum is located in the anterior right pelvis. Normal appendix. Negative terminal ileum. No dilated small bowel. Decompressed stomach and duodenum. Small gastric hiatal hernia. No abdominal free fluid  or free air. Vascular/Lymphatic: Aortoiliac calcified atherosclerosis. Vascular patency is not evaluated in the absence of IV contrast. Reproductive: Surgically absent. Other: No pelvic free fluid. Scattered bilateral calcified injection granulomas at both flanks. Musculoskeletal: Osteopenia. Levoconvex scoliosis. No acute osseous abnormality identified. IMPRESSION: 1. Recurrence of the left anterior pelvic hernia since February. The hernia contains  sigmoid colon and a small volume of mesentery similar to that depicted on 01/21/2016. There is superficial soft tissue inflammation surrounding the hernia, but the herniated bowel does not appear incarcerated or obstructed. 2. No other acute or inflammatory process in the abdomen or pelvis. Retained stool in redundant large bowel. 3. Chronic cholelithiasis, right nephrolithiasis, iliac artery calcified atherosclerosis. Electronically Signed   By: Genevie Ann M.D.   On: 11/12/2017 10:19    Procedures Procedures (including critical care time)  Medications Ordered in ED Medications  morphine 4 MG/ML injection 4 mg (4 mg Intravenous Given 11/12/17 0818)  methylPREDNISolone sodium succinate (SOLU-MEDROL) 125 mg/2 mL injection 125 mg (125 mg Intravenous Given 11/12/17 0817)  diphenhydrAMINE (BENADRYL) injection 25 mg (25 mg Intravenous Given 11/12/17 0817)  famotidine (PEPCID) IVPB 20 mg premix (0 mg Intravenous Stopped 11/12/17 0956)     Initial Impression / Assessment and Plan / ED Course  I have reviewed the triage vital signs and the nursing notes.  Pertinent labs & imaging results that were available during my care of the patient were reviewed by me and considered in my medical decision making (see chart for details).    Patient is overall well-appearing.  She feels much better at this time.  She is tolerating fluids.  She will follow back up with [place] surgery regarding the recurrence of her hernia.  I do not believe this is incarcerated or  obstructing at this time clinically nor radiographically.  She is ambulatory in the ER.  She is tolerating fluids.  Her hives have improved.  Nonspecific allergic reaction home with standard treatment.  She understands to return to the ER for new or worsening symptoms    Final Clinical Impressions(s) / ED Diagnoses   Final diagnoses:  Hives  Abdominal pain, unspecified abdominal location    ED Discharge Orders        Ordered    diphenhydrAMINE (BENADRYL) 25 mg capsule  Every 6 hours PRN     11/12/17 1253    predniSONE (DELTASONE) 10 MG tablet  Daily     11/12/17 1253    famotidine (PEPCID) 20 MG tablet  2 times daily     11/12/17 1253       Jola Schmidt, MD 11/12/17 1257

## 2017-11-13 ENCOUNTER — Encounter (HOSPITAL_COMMUNITY): Payer: Self-pay | Admitting: Emergency Medicine

## 2017-11-13 ENCOUNTER — Emergency Department (HOSPITAL_COMMUNITY)
Admission: EM | Admit: 2017-11-13 | Discharge: 2017-11-14 | Disposition: A | Discharge status: Other Acute Inpatient Hospital | Payer: Medicare Other | Attending: Emergency Medicine | Admitting: Emergency Medicine

## 2017-11-13 ENCOUNTER — Emergency Department (HOSPITAL_COMMUNITY): Payer: Medicare Other

## 2017-11-13 DIAGNOSIS — T7840XA Allergy, unspecified, initial encounter: Secondary | ICD-10-CM | POA: Diagnosis not present

## 2017-11-13 DIAGNOSIS — F0390 Unspecified dementia without behavioral disturbance: Secondary | ICD-10-CM | POA: Insufficient documentation

## 2017-11-13 DIAGNOSIS — Z09 Encounter for follow-up examination after completed treatment for conditions other than malignant neoplasm: Secondary | ICD-10-CM | POA: Diagnosis not present

## 2017-11-13 DIAGNOSIS — Y999 Unspecified external cause status: Secondary | ICD-10-CM | POA: Insufficient documentation

## 2017-11-13 DIAGNOSIS — Y939 Activity, unspecified: Secondary | ICD-10-CM | POA: Insufficient documentation

## 2017-11-13 DIAGNOSIS — N179 Acute kidney failure, unspecified: Secondary | ICD-10-CM | POA: Diagnosis not present

## 2017-11-13 DIAGNOSIS — Z79899 Other long term (current) drug therapy: Secondary | ICD-10-CM | POA: Diagnosis not present

## 2017-11-13 DIAGNOSIS — E860 Dehydration: Secondary | ICD-10-CM

## 2017-11-13 DIAGNOSIS — S37009A Unspecified injury of unspecified kidney, initial encounter: Secondary | ICD-10-CM | POA: Insufficient documentation

## 2017-11-13 DIAGNOSIS — L270 Generalized skin eruption due to drugs and medicaments taken internally: Secondary | ICD-10-CM | POA: Diagnosis not present

## 2017-11-13 DIAGNOSIS — R109 Unspecified abdominal pain: Secondary | ICD-10-CM | POA: Diagnosis not present

## 2017-11-13 DIAGNOSIS — Y929 Unspecified place or not applicable: Secondary | ICD-10-CM | POA: Insufficient documentation

## 2017-11-13 DIAGNOSIS — X58XXXA Exposure to other specified factors, initial encounter: Secondary | ICD-10-CM | POA: Diagnosis not present

## 2017-11-13 DIAGNOSIS — L511 Stevens-Johnson syndrome: Secondary | ICD-10-CM | POA: Diagnosis not present

## 2017-11-13 DIAGNOSIS — R21 Rash and other nonspecific skin eruption: Secondary | ICD-10-CM | POA: Diagnosis present

## 2017-11-13 DIAGNOSIS — I10 Essential (primary) hypertension: Secondary | ICD-10-CM | POA: Diagnosis not present

## 2017-11-13 LAB — URINALYSIS, ROUTINE W REFLEX MICROSCOPIC
Bilirubin Urine: NEGATIVE
Glucose, UA: NEGATIVE mg/dL
Hgb urine dipstick: NEGATIVE
Ketones, ur: NEGATIVE mg/dL
Leukocytes, UA: NEGATIVE
Nitrite: NEGATIVE
Protein, ur: NEGATIVE mg/dL
Specific Gravity, Urine: 1.021 (ref 1.005–1.030)
pH: 6 (ref 5.0–8.0)

## 2017-11-13 LAB — CBC WITH DIFFERENTIAL/PLATELET
Basophils Absolute: 0 10*3/uL (ref 0.0–0.1)
Basophils Relative: 0 %
Eosinophils Absolute: 0.2 10*3/uL (ref 0.0–0.7)
Eosinophils Relative: 2 %
HCT: 46.8 % — ABNORMAL HIGH (ref 36.0–46.0)
Hemoglobin: 15.3 g/dL — ABNORMAL HIGH (ref 12.0–15.0)
Lymphocytes Relative: 21 %
Lymphs Abs: 1.8 10*3/uL (ref 0.7–4.0)
MCH: 31.5 pg (ref 26.0–34.0)
MCHC: 32.7 g/dL (ref 30.0–36.0)
MCV: 96.3 fL (ref 78.0–100.0)
Monocytes Absolute: 0.8 10*3/uL (ref 0.1–1.0)
Monocytes Relative: 9 %
Neutro Abs: 5.8 10*3/uL (ref 1.7–7.7)
Neutrophils Relative %: 68 %
Platelets: ADEQUATE 10*3/uL (ref 150–400)
RBC: 4.86 MIL/uL (ref 3.87–5.11)
RDW: 13.7 % (ref 11.5–15.5)
WBC: 8.6 10*3/uL (ref 4.0–10.5)

## 2017-11-13 LAB — I-STAT CG4 LACTIC ACID, ED
Lactic Acid, Venous: 2.31 mmol/L (ref 0.5–1.9)
Lactic Acid, Venous: 1.93 mmol/L — ABNORMAL HIGH (ref 0.5–1.9)

## 2017-11-13 LAB — COMPREHENSIVE METABOLIC PANEL
ALT: 22 U/L (ref 14–54)
AST: 28 U/L (ref 15–41)
Albumin: 3.5 g/dL (ref 3.5–5.0)
Alkaline Phosphatase: 54 U/L (ref 38–126)
Anion gap: 9 (ref 5–15)
BUN: 28 mg/dL — ABNORMAL HIGH (ref 6–20)
CO2: 23 mmol/L (ref 22–32)
Calcium: 9.2 mg/dL (ref 8.9–10.3)
Chloride: 111 mmol/L (ref 101–111)
Creatinine, Ser: 1.51 mg/dL — ABNORMAL HIGH (ref 0.44–1.00)
GFR calc Af Amer: 40 mL/min — ABNORMAL LOW (ref >60)
GFR calc non Af Amer: 35 mL/min — ABNORMAL LOW (ref >60)
Glucose, Bld: 111 mg/dL — ABNORMAL HIGH (ref 65–99)
Potassium: 4.4 mmol/L (ref 3.5–5.1)
Sodium: 143 mmol/L (ref 135–145)
Total Bilirubin: 0.4 mg/dL (ref 0.3–1.2)
Total Protein: 6.3 g/dL — ABNORMAL LOW (ref 6.5–8.1)

## 2017-11-13 LAB — LIPASE, BLOOD: Lipase: 22 U/L (ref 11–51)

## 2017-11-13 LAB — SODIUM, URINE, RANDOM: Sodium, Ur: 50 mmol/L

## 2017-11-13 LAB — CREATININE, URINE, RANDOM: Creatinine, Urine: 200.65 mg/dL

## 2017-11-13 LAB — PROTEIN / CREATININE RATIO, URINE
Creatinine, Urine: 199.12 mg/dL
Protein Creatinine Ratio: 0.09 mg/mg{Cre} (ref 0.00–0.15)
Total Protein, Urine: 17 mg/dL

## 2017-11-13 MED ORDER — ONDANSETRON HCL 4 MG/2ML IJ SOLN
4.0000 mg | Freq: Once | INTRAMUSCULAR | Status: AC
  Administered 2017-11-13: 4 mg via INTRAVENOUS
  Filled 2017-11-13: qty 2

## 2017-11-13 MED ORDER — OXYCODONE HCL 5 MG PO TABS: 5.0000 mg | ORAL_TABLET | ORAL | Status: DC | PRN

## 2017-11-13 MED ORDER — FAMOTIDINE IN NACL 20-0.9 MG/50ML-% IV SOLN
20.0000 mg | Freq: Once | INTRAVENOUS | Status: AC
  Administered 2017-11-13: 20 mg via INTRAVENOUS
  Filled 2017-11-13: qty 50

## 2017-11-13 MED ORDER — MORPHINE SULFATE (PF) 4 MG/ML IV SOLN
4.0000 mg | INTRAVENOUS | Status: DC | PRN
  Administered 2017-11-13 – 2017-11-14 (×2): 4 mg via INTRAVENOUS
  Filled 2017-11-13 (×2): qty 1

## 2017-11-13 MED ORDER — MORPHINE SULFATE (PF) 4 MG/ML IV SOLN
2.0000 mg | Freq: Once | INTRAVENOUS | Status: AC
  Administered 2017-11-13: 2 mg via INTRAVENOUS
  Filled 2017-11-13: qty 1

## 2017-11-13 MED ORDER — SODIUM CHLORIDE 0.9 % IV SOLN
Freq: Once | INTRAVENOUS | Status: AC
  Administered 2017-11-13: 16:00:00 via INTRAVENOUS

## 2017-11-13 MED ORDER — METHYLPREDNISOLONE SODIUM SUCC 125 MG IJ SOLR
125.0000 mg | Freq: Once | INTRAMUSCULAR | Status: AC
  Administered 2017-11-13: 125 mg via INTRAVENOUS
  Filled 2017-11-13: qty 2

## 2017-11-13 MED ORDER — SODIUM CHLORIDE 0.9 % IV SOLN: INTRAVENOUS | Status: DC

## 2017-11-13 MED ORDER — LORATADINE 10 MG PO TABS
10.0000 mg | ORAL_TABLET | Freq: Two times a day (BID) | ORAL | Status: DC
  Administered 2017-11-13: 10 mg via ORAL
  Filled 2017-11-13: qty 1

## 2017-11-13 MED ORDER — PREDNISONE 20 MG PO TABS: 50.0000 mg | ORAL_TABLET | Freq: Every day | ORAL | Status: DC

## 2017-11-13 MED ORDER — GI COCKTAIL ~~LOC~~
30.0000 mL | Freq: Once | ORAL | Status: DC
  Filled 2017-11-13: qty 30

## 2017-11-13 MED ORDER — DIPHENHYDRAMINE HCL 50 MG/ML IJ SOLN
25.0000 mg | Freq: Once | INTRAMUSCULAR | Status: AC
  Administered 2017-11-13: 25 mg via INTRAVENOUS
  Filled 2017-11-13: qty 1

## 2017-11-13 MED ORDER — SODIUM CHLORIDE 0.9 % IV BOLUS (SEPSIS)
1000.0000 mL | Freq: Once | INTRAVENOUS | Status: AC
  Administered 2017-11-13: 1000 mL via INTRAVENOUS

## 2017-11-13 MED ORDER — FENTANYL CITRATE (PF) 100 MCG/2ML IJ SOLN
50.0000 ug | Freq: Once | INTRAMUSCULAR | Status: DC
  Filled 2017-11-13: qty 2

## 2017-11-13 NOTE — ED Provider Notes (Signed)
[facility] EMERGENCY DEPARTMENT Provider Note   CSN: [id number] Arrival date & time: 11/13/17  1414     History   Chief Complaint Chief Complaint  Patient presents with  . Allergic Reaction    HPI [name] is a 66 y.o. female.  HPI   66 yo F with PMHx as below here with diffuse rash. History somewhat limited 2/2 dementia.  The patient was just admitted to the hospital from 11/15-11/19 for altered mental status thought secondary to polypharmacy and introduction of Tegretol versus trazodone to her usual medications.  She states that she returned home and throughout the day yesterday, developed diffuse, red, painful rash.  She was seen in the ED and diagnosed with urticaria and sent home.  Since then, her rash has progressively worsened and she has had severe diffuse body pain where her rash is, as well as nausea, poor p.o. intake, and abdominal cramping.  She denies any shortness of breath or wheezing.  No lip swelling.  She has no history of similar reactions in the past.  She was seen by her her PCP at [facility] today, who was concerned for ongoing, severe drug eruption and sent her here for evaluation.  He did mention possible Stevens-Johnson syndrome though the patient denies any oral lesions or pain.  No dysuria.  No fevers.  No other new medications.  Past Medical History:  Diagnosis Date  . Dementia   . Depression   . Epilepsy, grand mal ([place])    "last seizure ~ 2001" (06/16/2015)  . GERD (gastroesophageal reflux disease)   . High cholesterol   . Hypertension   . Migraine    "just about qd";  treated by Dr. [name] (10/15/2016)  . Pre-diabetes    she reports that she never took meds. for prediabetes, states MD has told her that she has corrected her situation with diet   . Seizures ([place])    "at one time; stopped in the 1990s" (10/15/2016)  . Stress at home    Pt. reports that she has a lot of personal stress & she is aware that it might being playing a  part in her Migraine heaaches      Patient Active Problem List   Diagnosis Date Noted  . Altered mental status 11/08/2017  . Acute encephalopathy 11/07/2017  . Seizure disorder ([place]) 11/07/2017  . Dementia 11/07/2017  . Essential hypertension 11/07/2017  . Headaches, cluster 11/07/2017  . AKI (acute kidney injury) ([place]) 11/07/2017  . Polypharmacy 05/15/2017  . Meningioma ([place]) 01/09/2016  . Depression 11/08/2015  . Abnormal head movements 11/08/2015  . Somnolence, daytime 07/14/2015  . Incisional hernia 06/16/2015  . Chronic headaches 04/01/2013    Past Surgical History:  Procedure Laterality Date  . HERNIA REPAIR    . INCISIONAL HERNIA REPAIR N/A 06/16/2015   Procedure: LAPAROSCOPIC INCISIONAL HERNIA WITH MESH;  Surgeon: Coralie Keens, MD;  Location: [facility];  Service: General;  Laterality: N/A;  . Paint  10/15/2016  . INCISIONAL HERNIA REPAIR N/A 10/15/2016   Procedure: OPEN INCISIONAL HERNIA REPAIR WITH MESH;  Surgeon: Coralie Keens, MD;  Location: [facility];  Service: General;  Laterality: N/A;  . INSERTION OF MESH N/A 06/16/2015   Procedure: INSERTION OF MESH;  Surgeon: Coralie Keens, MD;  Location: [facility];  Service: General;  Laterality: N/A;  . VAGINAL HYSTERECTOMY  ~ 1992    OB History    No data available       Home Medications  Prior to Admission medications   Medication Sig Start Date End Date Taking? Authorizing Provider  acetaminophen (TYLENOL) 500 MG tablet Take 1,000 mg by mouth every 6 (six) hours as needed for mild pain.   Yes [provider]  amLODipine (NORVASC) 5 MG tablet Take 5 mg by mouth daily after breakfast.    Yes [provider]  atorvastatin (LIPITOR) 40 MG tablet Take 40 mg by mouth daily after breakfast.    Yes [provider]  Calcium Carb-Cholecalciferol (CALCIUM 600 + D) 600-200 MG-UNIT TABS Take 1 tablet by mouth daily.    [provider]  carbamazepine (TEGRETOL XR) 200 MG 12  hr tablet Take 200 mg at bedtime by mouth. 10/23/17   [provider]  conjugated estrogens (PREMARIN) vaginal cream Place 1.5 g daily vaginally. 08/27/17   [provider]  diphenhydrAMINE (BENADRYL) 25 mg capsule Take 1 capsule (25 mg total) by mouth every 6 (six) hours as needed. 11/12/17   Jola Schmidt, MD  divalproex (DEPAKOTE ER) 500 MG 24 hr tablet TAKE 1 TABLET BY MOUTH EVERY MORNING AND TAKE 2 TABLETS BY MOUTH EVERY EVENING Patient taking differently: TAKE 500 mg TABLET BY MOUTH EVERY MORNING AND TAKE 1000 mg  TABLETS BY MOUTH EVERY EVENING 10/11/16   Marcial Pacas, MD  esomeprazole (NEXIUM) 40 MG capsule Take 40 mg by mouth daily after breakfast.  07/13/15   [provider]  famotidine (PEPCID) 20 MG tablet Take 1 tablet (20 mg total) by mouth 2 (two) times daily. 11/12/17   Jola Schmidt, MD  ferrous sulfate 325 (65 FE) MG tablet Take 325 mg by mouth daily with breakfast.    [provider]  magnesium oxide (MAG-OX) 400 MG tablet Take 400 mg by mouth 2 (two) times daily.     [provider]  Multiple Vitamin (MULTIVITAMIN WITH MINERALS) TABS Take 1 tablet by mouth daily.    [provider]  MYRBETRIQ 50 MG TB24 tablet Take 50 mg daily by mouth. 09/23/17   [provider]  predniSONE (DELTASONE) 10 MG tablet Take 2 tablets (20 mg total) by mouth daily. 11/12/17   Jola Schmidt, MD  topiramate (TOPAMAX) 100 MG tablet Take 100 mg 2 (two) times daily by mouth. 07/08/14   [provider]  TOVIAZ 8 MG TB24 tablet Take 8 mg by mouth at bedtime.  10/02/16   [provider]  traMADol (ULTRAM) 50 MG tablet Take 1 tablet (50 mg total) by mouth every 6 (six) hours as needed. Patient taking differently: Take 50 mg every 6 (six) hours as needed by mouth for moderate pain.  05/15/17   Marcial Pacas, MD  vitamin B-12 (CYANOCOBALAMIN) 100 MCG tablet Take 1,000 mcg daily by mouth.    [provider]    Family History Family  History  Problem Relation Age of Onset  . Lung cancer Mother   . Kidney failure Father   . Hypertension Other     Social History Social History   Tobacco Use  . Smoking status: Never Smoker  . Smokeless tobacco: Never Used  Substance Use Topics  . Alcohol use: No    Alcohol/week: 0.0 oz  . Drug use: No     Allergies   Nsaids; Aspirin; and Tolmetin   Review of Systems Review of Systems  Constitutional: Positive for fatigue.  Gastrointestinal: Positive for nausea.  Musculoskeletal: Positive for arthralgias and myalgias.  Skin: Positive for rash.  All other systems reviewed and are negative.  Physical Exam Updated Vital Signs BP 138/80 (BP Location: Right Arm)   Pulse (!) 108   Temp 98.4 F (36.9 C) (Oral)   Resp 20   Ht 5' (1.524 m)   Wt 61.2 kg (135 lb)   SpO2 100%   BMI 26.37 kg/m   Physical Exam  Constitutional: She is oriented to person, place, and time. She appears well-developed and well-nourished. She appears distressed.  HENT:  Head: Normocephalic and atraumatic.  Markedly dry mucous membranes.  No oral mucosal lesions.  Eyes: Conjunctivae are normal.  Neck: Neck supple.  Cardiovascular: Regular rhythm and normal heart sounds. Exam reveals no friction rub.  No murmur heard. Tachycardic  Pulmonary/Chest: Effort normal and breath sounds normal. No respiratory distress. She has no wheezes. She has no rales.  Abdominal: Soft. She exhibits no distension.  Musculoskeletal: She exhibits no edema.  Neurological: She is alert and oriented to person, place, and time. She exhibits normal muscle tone.  Skin: Skin is warm. Capillary refill takes less than 2 seconds. Rash (Well-demarcated, raised, erythematous rash extending throughout the neck, trunk, back, and bilateral proximal>distal UE and LE bilaterally; urticarial wheals noted; no targetoid lesions; no apparent palm or sole involvement) noted.  Psychiatric: She has a normal mood and affect.  Nursing  note and vitals reviewed.    ED Treatments / Results  Labs (all labs ordered are listed, but only abnormal results are displayed) Labs Reviewed  COMPREHENSIVE METABOLIC PANEL - Abnormal; Notable for the following components:      Result Value   Glucose, Bld 111 (*)    BUN 28 (*)    Creatinine, Ser 1.51 (*)    Total Protein 6.3 (*)    GFR calc non Af Amer 35 (*)    GFR calc Af Amer 40 (*)    All other components within normal limits  CBC WITH DIFFERENTIAL/PLATELET - Abnormal; Notable for the following components:   Hemoglobin 15.3 (*)    HCT 46.8 (*)    All other components within normal limits  I-STAT CG4 LACTIC ACID, ED - Abnormal; Notable for the following components:   Lactic Acid, Venous 2.31 (*)    All other components within normal limits  LIPASE, BLOOD  CBC WITH DIFFERENTIAL/PLATELET  URINALYSIS, ROUTINE W REFLEX MICROSCOPIC  SODIUM, URINE, RANDOM  CREATININE, URINE, RANDOM  PROTEIN / CREATININE RATIO, URINE  I-STAT CG4 LACTIC ACID, ED    EKG  EKG Interpretation None       Radiology Ct Abdomen Pelvis Wo Contrast  Result Date: 11/12/2017 CLINICAL DATA:  66 year old female with left lower quadrant abdominal pain and hives for 2 days. EXAM: CT ABDOMEN AND PELVIS WITHOUT CONTRAST TECHNIQUE: Multidetector CT imaging of the abdomen and pelvis was performed following the standard protocol without IV contrast. COMPARISON:  CT Abdomen and Pelvis 02/10/2017 and earlier. FINDINGS: Lower chest: Stable and negative. No pericardial or pleural effusion. Hepatobiliary: Small layering gallstones. Mildly distended gallbladder but no pericholecystic inflammation. Stable and negative liver with a small round low-density area in the posterior right hepatic lobe which appears benign. Pancreas: Negative. Spleen: Diminutive, negative. Adrenals/Urinary Tract: Normal adrenal glands. No hydronephrosis or perinephric stranding. Punctate right midpole nephrolithiasis. No left renal calculus.  Negative course of both ureters; there are chronic surgical clips along the left pelvic side wall. Unremarkable urinary bladder. Stomach/Bowel: There are postoperative changes to the midline lower abdominal and suprapubic abdominal wall. Recurrent left parasagittal incisional hernia has developed since February and contains a short segment of the sigmoid  colon and sigmoid mesenteric. This is similar in size and configuration to that on 01/22/2016. There is surrounding superficial subcutaneous inflammatory stranding (series 2, image 79). There is retained stool throughout the sigmoid colon which does not appear obstructed. There is no free fluid or gas within the hernia sac which measures 4-5 cm diameter. The proximal sigmoid is redundant. The distal sigmoid and rectum are decompressed. Retained stool continues in the left colon, throughout the redundant splenic flexure, transverse colon, and right colon. The cecum is located in the anterior right pelvis. Normal appendix. Negative terminal ileum. No dilated small bowel. Decompressed stomach and duodenum. Small gastric hiatal hernia. No abdominal free fluid or free air. Vascular/Lymphatic: Aortoiliac calcified atherosclerosis. Vascular patency is not evaluated in the absence of IV contrast. Reproductive: Surgically absent. Other: No pelvic free fluid. Scattered bilateral calcified injection granulomas at both flanks. Musculoskeletal: Osteopenia. Levoconvex scoliosis. No acute osseous abnormality identified. IMPRESSION: 1. Recurrence of the left anterior pelvic hernia since February. The hernia contains sigmoid colon and a small volume of mesentery similar to that depicted on 01/21/2016. There is superficial soft tissue inflammation surrounding the hernia, but the herniated bowel does not appear incarcerated or obstructed. 2. No other acute or inflammatory process in the abdomen or pelvis. Retained stool in redundant large bowel. 3. Chronic cholelithiasis, right  nephrolithiasis, iliac artery calcified atherosclerosis. Electronically Signed   By: Genevie Ann M.D.   On: 11/12/2017 10:19   Dg Abdomen Acute W/chest  Result Date: 11/13/2017 CLINICAL DATA:  Weakness and abdominal pain EXAM: DG ABDOMEN ACUTE W/ 1V CHEST COMPARISON:  11/12/2017 FINDINGS: Cardiac shadow is within normal limits. Aortic calcifications are seen. The lungs are clear bilaterally. Scattered large and small bowel gas is noted. Fecal material is noted throughout the colon consistent with a degree of constipation. No obstructive changes are seen. Postsurgical changes are noted in the left hemipelvis. No free air is noted. IMPRESSION: Changes of constipation. No other focal abnormality is noted. Electronically Signed   By: Inez Catalina M.D.   On: 11/13/2017 16:40    Procedures Procedures (including critical care time)  Medications Ordered in ED Medications  fentaNYL (SUBLIMAZE) injection 50 mcg (50 mcg Intravenous Not Given 11/13/17 1630)  gi cocktail (Maalox,Lidocaine,Donnatal) (30 mLs Oral Not Given 11/13/17 1630)  0.9 %  sodium chloride infusion ( Intravenous New Bag/Given 11/13/17 1557)  morphine 4 MG/ML injection 2 mg (2 mg Intravenous Given 11/13/17 1550)  ondansetron (ZOFRAN) injection 4 mg (4 mg Intravenous Given 11/13/17 1552)  famotidine (PEPCID) IVPB 20 mg premix (0 mg Intravenous Stopped 11/13/17 1653)  methylPREDNISolone sodium succinate (SOLU-MEDROL) 125 mg/2 mL injection 125 mg (125 mg Intravenous Given 11/13/17 1555)  sodium chloride 0.9 % bolus 1,000 mL (0 mLs Intravenous Stopped 11/13/17 1654)  diphenhydrAMINE (BENADRYL) injection 25 mg (25 mg Intravenous Given 11/13/17 1559)     Initial Impression / Assessment and Plan / ED Course  I have reviewed the triage vital signs and the nursing notes.  Pertinent labs & imaging results that were available during my care of the patient were reviewed by me and considered in my medical decision making (see chart for details).      66 yo F with PMHx as above here with diffuse painful rash and abdominal pain. Pt was just admitted following AMS 2/2 starting tegretol versus trazodone (unclear which med per records). On exam, pt has diffuse, raised, rash but no oral mucosal lesions. No conjunctival injection or other signs of mucosal involvement. She does appear  uncomfortable and significantly dehydrated so will give fluids, antihistamines, steroids, and analgesia.  Lab work shows hemoconcentration, worsening AKI with uptrending BUN/Cr likely pre-renal. AAS non-obstructive but does show constipation, which may be contirbuting to her abd pain. Given her worsening rash, AKI, and difficulty tolerating PO, will admit for IV hydration, monitoring. Unclear if this is primary drug eruption versus atypical allergic reaction, but I think it's reasonable to tx with steroids, antihistamines, fluids and re-assess. Eosinophils normal on diff, making DRESS less likely.  Discussed with Dr. Loleta Books of Hospitalist, who will see pt and evaluate rash. If rash needs emergent, rather than urgent, dermatology evaluated, pt will need to be transferred to facility with inpt dermatology consultation. Feel this is reasonable based on Dr. Sabino Niemann assessment. Pt improved on my exam, fluids running.   Final Clinical Impressions(s) / ED Diagnoses   Final diagnoses:  Dehydration  AKI (acute kidney injury) ([place])  Drug eruption      Duffy Bruce, MD 11/13/17 1839

## 2017-11-13 NOTE — ED Notes (Signed)
ETA on [facility] transport approximately two more hours.

## 2017-11-13 NOTE — ED Triage Notes (Signed)
Per EMS, patient is coming from PCP with worsening allergic reaction.  She was recently discharged from hospital due to immobility and weakness.  When she got home on the 19th, she began having an allergic reaction to unknown agent.  Rash began spreading to abdomen then back and now it is moving up chest.  She is now also having pain all over.  110/83, 98% RA, RR 18, 04 HR.

## 2017-11-13 NOTE — ED Notes (Signed)
Lab results reported to [place], [place].

## 2017-11-13 NOTE — ED Provider Notes (Addendum)
Blood pressure 138/80, pulse (!) 108, temperature 98.4 F (36.9 C), temperature source Oral, resp. rate 20, height 5' (1.524 m), weight 61.2 kg (135 lb), SpO2 100 %.  Assuming care from Dr. Ellender Hose.  In short, [name] is a 66 y.o. female with a chief complaint of Allergic Reaction .  Refer to the original H&P for additional details.  The current plan of care is to follow hospitalist consultation and reassess.  05:50 PM Spoke with Dr. Loleta Books with [facility]. Given the patient's primary presenting symptom/concern is rash-related, and we have no possibility for Dermatology at this facility, plan for transfer to [facility] for treatment of AKI and Dermatology consultation. Will discuss transfer with outside hospitalist team.   06:15 PM Spoke with admission team at [facility] via transfer center. She is accepted and a bed has been assigned. Will arrange transport. EMTALA documentation completed and I updated the family regarding the plan for transfer. Accepting Physician: Suzan Nailer  I reviewed all nursing notes, vitals, pertinent old records, EKGs, labs, imaging (as available).  Nanda Quinton, MD      Margette Fast, MD 11/13/17 Dyann Ruddle    Margette Fast, MD 11/13/17 2158

## 2017-11-13 NOTE — ED Notes (Addendum)
Three unsuccessful attempts at IV access between two nurses.  Ultrasound machine at bedside for EDP. EDP made aware and he is going to attempt IV access with ultrasound.

## 2017-11-13 NOTE — Consult Note (Signed)
Hospitalist Service Medical Consultation   [name]  ASN:[id number]  DOB: 10-12-1951  DOA: 11/13/2017  PCP: Carol Ada, MD      Requesting physician: Duffy Bruce, MD  Reason for consultation: Rash, AKI   History of Present Illness: [name] is an 66 y.o. female with hx of chronic intractable migraines, seizure d/o well-controlled, and HTN who presents with urticarial rash for 2 days.  The patient is mostly unable to participate in history taking due to pain in her back and shoulders and sides from her rash.    She was recently admitted to our service for confusion, weakness and inability to walk.  This was attributed to either Butrans patch, new trazodone or new Tegretol, although notes are clear that patient was very confused about her meds, and tonight they are the same way.  She was treated with IV fluids, and her trazodone and carbamazepine were held and she improved back to her [place]leine.  An MRI brain was unremarkable and she was discharged to home 2 days ago.    Her husband states that since discharge from the hospital 2 days ago, she has developed a new red painful rash on her back.  This is small red dime-sized confluent raised welts, that are tender to touch.  There has been no fever, chills, constitutional symptoms, but she has nausea, abdominal discomfort, skin pain at the rash, and is weak again. She was seen in the ER last night for this, treated with IV fluids, diphenhydramine, famotidine and prednisone, and felt better, "hives improved" and was discharged home.    She saw her PCP today, the rash seemed to have progressed, and so she was sent to the hospital, per husband for "steroid drip".  ED course: -Afebrile, heart rate 108, respirations 20, BP 138/80 -Sodium 143, potassium 4.4, creatinine 1.51 (from baseline 0.9), lactate 2.3 -WBC 8.6K, hemoglobin 15.3 -Lipase normal -Urine culture from yesterday showed coag negative staph -MR  brain from 1 week ago showed old meningiomas, no other focal or acute change -CT abdomen and pelvis from last night showed nonincarcerated hernia, no hydronephrosis, constipation -Abdominal x-ray from today showed constipation again, chest x-ray clear -She was given IV fluids, diphenhydramine, Solu-Medrol, morphine, Pepcid, and TRH were asked to evaluate for rash and AKI        Review of Systems:  Review of Systems  Constitutional: Positive for malaise/fatigue. Negative for chills and fever.  Respiratory: Negative for cough and sputum production.   Cardiovascular: Negative for chest pain.  Gastrointestinal: Positive for abdominal pain and nausea.  Genitourinary: Negative for dysuria, flank pain, frequency, hematuria and urgency.  Musculoskeletal: Negative for back pain, falls, joint pain, myalgias and neck pain.  Skin: Positive for rash. Negative for itching.  Neurological: Positive for weakness and headaches. Negative for dizziness, tingling, tremors, sensory change, speech change, focal weakness, seizures and loss of consciousness.  All other systems reviewed and are negative.   Past Medical History: Past Medical History:  Diagnosis Date  . Dementia   . Depression   . Epilepsy, grand mal ([place])    "last seizure ~ 2001" (06/16/2015)  . GERD (gastroesophageal reflux disease)   . High cholesterol   . Hypertension   . Migraine    "just about qd";  treated by Dr. [name] (10/15/2016)  . Pre-diabetes    she reports that she never took meds. for prediabetes, states MD has told her  that she has corrected her situation with diet   . Seizures ([place])    "at one time; stopped in the 1990s" (10/15/2016)  . Stress at home    Pt. reports that she has a lot of personal stress & she is aware that it might being playing a part in her Migraine heaaches      Past Surgical History: Past Surgical History:  Procedure Laterality Date  . HERNIA REPAIR    . INCISIONAL HERNIA REPAIR N/A 06/16/2015    Procedure: LAPAROSCOPIC INCISIONAL HERNIA WITH MESH;  Surgeon: Coralie Keens, MD;  Location: [place] [facility];  Service: General;  Laterality: N/A;  . [place]  10/15/2016  . INCISIONAL HERNIA REPAIR N/A 10/15/2016   Procedure: OPEN INCISIONAL HERNIA REPAIR WITH MESH;  Surgeon: Coralie Keens, MD;  Location: [place];  Service: General;  Laterality: N/A;  . INSERTION OF MESH N/A 06/16/2015   Procedure: INSERTION OF MESH;  Surgeon: Coralie Keens, MD;  Location: [place];  Service: General;  Laterality: N/A;  . VAGINAL HYSTERECTOMY  ~ 1992     Allergies:   Allergies  Allergen Reactions  . Nsaids Other (See Comments)    Can cause ulcers  . Aspirin Nausea Only and Other (See Comments)    Could cause ulcers   . Tolmetin Other (See Comments)    Can cause ulcers     Social History: Lives with her husband. Does not use a walker.  Nonsmoker.   Family History: Family History  Problem Relation Age of Onset  . Lung cancer Mother   . Kidney failure Father   . Hypertension Other      Physical Exam: Vitals:   11/13/17 1423 11/13/17 1600 11/13/17 1708  BP:  (!) 119/103 138/80  Pulse:  (!) 108 (!) 108  Resp:   20  Temp:   98.4 F (36.9 C)  TempSrc:   Oral  SpO2:  100% 100%  Weight: 61.2 kg (135 lb)    Height: 5' (1.524 m)      Constitutional: Elderly female, awake, but appears to be in pain from her back.  Oriented to place, situation, date.   Eyes: PERLA, EOMI, irises appear normal, conjunctiva inflamed ENMT: external ears and nose appear normal, hearing normal            Lips appears normal, oropharynx mucosa, tongue, posterior pharynx appear normal  Neck: neck appears normal, no masses, normal ROM, no thyromegaly, prominent carotid pulse  CVS: Tachy, regular, S1-S2 clear, no murmur rubs or gallops, no LE edema, normal pedal pulses  Respiratory:  clear to auscultation bilaterally, no wheezing, rales or rhonchi. Respiratory effort normal. No accessory muscle use.  GI:  soft nontender, nondistended, normal bowel sounds, no hepatosplenomegaly, no hernias  Musculoskeletal: no cyanosis, clubbing or edema noted bilaterally Neuro: Cranial nerves II-XII intact, strength 5/5 and symmetric, oriented, sensation normal Psych: judgement and insight appear somewhat impaired, blunted/flat mood and affect, mental status Skin: there is a confluent raised red urticarial rash on the trunk, primarily the back and sides/flanks, also the upper chest and neck, sparing the extremities, this is tender to palpation, but not cellulitic   Data reviewed:  I have personally reviewed following labs and imaging studies Labs:  CBC: Recent Labs  Lab 11/07/17 1302 11/08/17 0432 11/12/17 0808 11/13/17 1545  WBC 11.6* 10.5 11.6* 8.6  NEUTROABS  --   --  7.7 5.8  HGB 14.0 12.8 15.6* 15.3*  HCT 42.8 39.8 45.9 46.8*  MCV 94.5 94.1 95.0  96.3  PLT 259 237 296 PLATELET CLUMPS NOTED ON SMEAR, COUNT APPEARS ADEQUATE    Basic Metabolic Panel: Recent Labs  Lab 11/07/17 1302 11/08/17 0432 11/10/17 0236 11/12/17 0808 11/13/17 1447  NA 139 139 140 143 143  K 3.3* 3.5 3.4* 4.3 4.4  CL 107 106 109 110 111  CO2 24 26 23 24 23   GLUCOSE 92 106* 114* 104* 111*  BUN 26* 20 16 21* 28*  CREATININE 1.31* 1.21* 0.96 1.30* 1.51*  CALCIUM 9.4 8.9 9.0 9.6 9.2  PHOS  --   --  3.3  --   --    GFR Estimated Creatinine Clearance: 29.6 mL/min (A) (by C-G formula based on SCr of 1.51 mg/dL (H)). Liver Function Tests: Recent Labs  Lab 11/07/17 1302 11/10/17 0236 11/12/17 0808 11/13/17 1447  AST 18  --  24 28  ALT 12*  --  18 22  ALKPHOS 46  --  53 54  BILITOT 0.4  --  0.1* 0.4  PROT 6.9  --  7.1 6.3*  ALBUMIN 3.8 2.9* 3.8 3.5   Recent Labs  Lab 11/12/17 0808 11/13/17 1447  LIPASE 26 22   Recent Labs  Lab 11/07/17 1528  AMMONIA 59*   Coagulation profile No results for input(s): INR, PROTIME in the last 168 hours.  Cardiac Enzymes: No results for input(s): CKTOTAL, CKMB,  CKMBINDEX, TROPONINI in the last 168 hours. BNP: Invalid input(s): POCBNP CBG: Recent Labs  Lab 11/07/17 1302 11/07/17 1420 11/07/17 1532  GLUCAP 61* 64* 124*   D-Dimer No results for input(s): DDIMER in the last 72 hours. Hgb A1c No results for input(s): HGBA1C in the last 72 hours. Lipid Profile No results for input(s): CHOL, HDL, LDLCALC, TRIG, CHOLHDL, LDLDIRECT in the last 72 hours. Thyroid function studies No results for input(s): TSH, T4TOTAL, T3FREE, THYROIDAB in the last 72 hours.  Invalid input(s): FREET3 Anemia work up No results for input(s): VITAMINB12, FOLATE, FERRITIN, TIBC, IRON, RETICCTPCT in the last 72 hours. Urinalysis    Component Value Date/Time   COLORURINE AMBER (A) 11/12/2017 0812   APPEARANCEUR CLOUDY (A) 11/12/2017 0812   LABSPEC 1.030 11/12/2017 0812   PHURINE 6.0 11/12/2017 0812   GLUCOSEU NEGATIVE 11/12/2017 0812   HGBUR NEGATIVE 11/12/2017 0812   BILIRUBINUR SMALL (A) 11/12/2017 0812   KETONESUR 5 (A) 11/12/2017 0812   PROTEINUR 30 (A) 11/12/2017 0812   NITRITE NEGATIVE 11/12/2017 0812   LEUKOCYTESUR MODERATE (A) 11/12/2017 0812     Sepsis Labs Invalid input(s): PROCALCITONIN,  WBC,  LACTICIDVEN Microbiology Recent Results (from the past 240 hour(s))  Urine culture     Status: Abnormal (Preliminary result)   Collection Time: 11/12/17 12:28 PM  Result Value Ref Range Status   Specimen Description URINE, CLEAN CATCH  Final   Special Requests NONE  Final   Culture (A)  Final    >=100,000 COLONIES/mL STAPHYLOCOCCUS SPECIES (COAGULASE NEGATIVE)   Report Status PENDING  Incomplete       Inpatient Medications:   Scheduled Meds: . gi cocktail  30 mL Oral Once   Continuous Infusions:   Radiological Exams on Admission: Ct Abdomen Pelvis Wo Contrast  Result Date: 11/12/2017 CLINICAL DATA:  66 year old female with left lower quadrant abdominal pain and hives for 2 days. EXAM: CT ABDOMEN AND PELVIS WITHOUT CONTRAST TECHNIQUE:  Multidetector CT imaging of the abdomen and pelvis was performed following the standard protocol without IV contrast. COMPARISON:  CT Abdomen and Pelvis 02/10/2017 and earlier. FINDINGS: Lower chest: Stable and negative.  No pericardial or pleural effusion. Hepatobiliary: Small layering gallstones. Mildly distended gallbladder but no pericholecystic inflammation. Stable and negative liver with a small round low-density area in the posterior right hepatic lobe which appears benign. Pancreas: Negative. Spleen: Diminutive, negative. Adrenals/Urinary Tract: Normal adrenal glands. No hydronephrosis or perinephric stranding. Punctate right midpole nephrolithiasis. No left renal calculus. Negative course of both ureters; there are chronic surgical clips along the left pelvic side wall. Unremarkable urinary bladder. Stomach/Bowel: There are postoperative changes to the midline lower abdominal and suprapubic abdominal wall. Recurrent left parasagittal incisional hernia has developed since February and contains a short segment of the sigmoid colon and sigmoid mesenteric. This is similar in size and configuration to that on 01/22/2016. There is surrounding superficial subcutaneous inflammatory stranding (series 2, image 79). There is retained stool throughout the sigmoid colon which does not appear obstructed. There is no free fluid or gas within the hernia sac which measures 4-5 cm diameter. The proximal sigmoid is redundant. The distal sigmoid and rectum are decompressed. Retained stool continues in the left colon, throughout the redundant splenic flexure, transverse colon, and right colon. The cecum is located in the anterior right pelvis. Normal appendix. Negative terminal ileum. No dilated small bowel. Decompressed stomach and duodenum. Small gastric hiatal hernia. No abdominal free fluid or free air. Vascular/Lymphatic: Aortoiliac calcified atherosclerosis. Vascular patency is not evaluated in the absence of IV contrast.  Reproductive: Surgically absent. Other: No pelvic free fluid. Scattered bilateral calcified injection granulomas at both flanks. Musculoskeletal: Osteopenia. Levoconvex scoliosis. No acute osseous abnormality identified. IMPRESSION: 1. Recurrence of the left anterior pelvic hernia since February. The hernia contains sigmoid colon and a small volume of mesentery similar to that depicted on 01/21/2016. There is superficial soft tissue inflammation surrounding the hernia, but the herniated bowel does not appear incarcerated or obstructed. 2. No other acute or inflammatory process in the abdomen or pelvis. Retained stool in redundant large bowel. 3. Chronic cholelithiasis, right nephrolithiasis, iliac artery calcified atherosclerosis. Electronically Signed   By: Genevie Ann M.D.   On: 11/12/2017 10:19   Dg Abdomen Acute W/chest  Result Date: 11/13/2017 CLINICAL DATA:  Weakness and abdominal pain EXAM: DG ABDOMEN ACUTE W/ 1V CHEST COMPARISON:  11/12/2017 FINDINGS: Cardiac shadow is within normal limits. Aortic calcifications are seen. The lungs are clear bilaterally. Scattered large and small bowel gas is noted. Fecal material is noted throughout the colon consistent with a degree of constipation. No obstructive changes are seen. Postsurgical changes are noted in the left hemipelvis. No free air is noted. IMPRESSION: Changes of constipation. No other focal abnormality is noted. Electronically Signed   By: Inez Catalina M.D.   On: 11/13/2017 16:40    Impression/Recommendations    1. Rash Suspect this is a simple morbilliform drug rash, perhaps from trazodone or Tegretol, which she took for about 3-4 days, right before her last hospitalization from what I can tell (although this is very difficult to understand, husband is confused).  Husband unclear if "dilantin" or "diltiazem" were also possibly the names of the medicine she took.  With Tegretol involved, and pain of rash, I do not feel comfortable ruling out  early SJS. -Recommend transfer to facility with Dermatology specialty coverage -Reasonable to continue prednisone, antihistamines -Check LFTs -Oxycodone for pain  2. Acute kidney injury -Check UA, urine electrolytes -IV fluids -Hold ACEi  3. History of migraines -Continue Depakote, topiramate  4. Hypertension -Continue amlodipine  -Hold ACEi  5. Other medications -May continue fesoterodine, PPI, famotidine,  tizanidine per home med list     Thank you for this consultation.      Edwin Dada M.D. [facility] 11/13/2017, 5:56 PM

## 2017-11-14 DIAGNOSIS — Y939 Activity, unspecified: Secondary | ICD-10-CM | POA: Diagnosis not present

## 2017-11-14 DIAGNOSIS — E860 Dehydration: Secondary | ICD-10-CM | POA: Diagnosis not present

## 2017-11-14 DIAGNOSIS — G43919 Migraine, unspecified, intractable, without status migrainosus: Secondary | ICD-10-CM | POA: Diagnosis present

## 2017-11-14 DIAGNOSIS — I10 Essential (primary) hypertension: Secondary | ICD-10-CM | POA: Diagnosis present

## 2017-11-14 DIAGNOSIS — G40919 Epilepsy, unspecified, intractable, without status epilepticus: Secondary | ICD-10-CM | POA: Diagnosis not present

## 2017-11-14 DIAGNOSIS — Y999 Unspecified external cause status: Secondary | ICD-10-CM | POA: Diagnosis not present

## 2017-11-14 DIAGNOSIS — K219 Gastro-esophageal reflux disease without esophagitis: Secondary | ICD-10-CM | POA: Diagnosis present

## 2017-11-14 DIAGNOSIS — Z79899 Other long term (current) drug therapy: Secondary | ICD-10-CM | POA: Diagnosis not present

## 2017-11-14 DIAGNOSIS — L959 Vasculitis limited to the skin, unspecified: Secondary | ICD-10-CM | POA: Diagnosis not present

## 2017-11-14 DIAGNOSIS — N3281 Overactive bladder: Secondary | ICD-10-CM | POA: Diagnosis not present

## 2017-11-14 DIAGNOSIS — R21 Rash and other nonspecific skin eruption: Secondary | ICD-10-CM | POA: Diagnosis present

## 2017-11-14 DIAGNOSIS — N179 Acute kidney failure, unspecified: Secondary | ICD-10-CM | POA: Diagnosis not present

## 2017-11-14 DIAGNOSIS — L270 Generalized skin eruption due to drugs and medicaments taken internally: Secondary | ICD-10-CM | POA: Diagnosis not present

## 2017-11-14 DIAGNOSIS — X58XXXA Exposure to other specified factors, initial encounter: Secondary | ICD-10-CM | POA: Diagnosis not present

## 2017-11-14 DIAGNOSIS — Y929 Unspecified place or not applicable: Secondary | ICD-10-CM | POA: Diagnosis not present

## 2017-11-14 DIAGNOSIS — S37009A Unspecified injury of unspecified kidney, initial encounter: Secondary | ICD-10-CM | POA: Diagnosis not present

## 2017-11-14 DIAGNOSIS — G40909 Epilepsy, unspecified, not intractable, without status epilepticus: Secondary | ICD-10-CM | POA: Diagnosis present

## 2017-11-14 DIAGNOSIS — D320 Benign neoplasm of cerebral meninges: Secondary | ICD-10-CM | POA: Diagnosis present

## 2017-11-14 DIAGNOSIS — E785 Hyperlipidemia, unspecified: Secondary | ICD-10-CM | POA: Diagnosis not present

## 2017-11-14 DIAGNOSIS — L299 Pruritus, unspecified: Secondary | ICD-10-CM | POA: Diagnosis present

## 2017-11-14 DIAGNOSIS — Z8669 Personal history of other diseases of the nervous system and sense organs: Secondary | ICD-10-CM | POA: Diagnosis not present

## 2017-11-14 DIAGNOSIS — Z9889 Other specified postprocedural states: Secondary | ICD-10-CM | POA: Diagnosis not present

## 2017-11-14 DIAGNOSIS — Z86011 Personal history of benign neoplasm of the brain: Secondary | ICD-10-CM | POA: Diagnosis not present

## 2017-11-14 DIAGNOSIS — F0390 Unspecified dementia without behavioral disturbance: Secondary | ICD-10-CM | POA: Diagnosis not present

## 2017-11-14 LAB — URINE CULTURE: Culture: >=100000 — AB

## 2017-11-14 NOTE — ED Notes (Signed)
Contacted [facility] [place] line stated they have had more emergent calls so that pushed the PT back on the list. [facility] line stated at least 2 more hours before arriving.

## 2017-11-14 NOTE — ED Notes (Signed)
Called [facility] for transport 

## 2017-11-15 DIAGNOSIS — L270 Generalized skin eruption due to drugs and medicaments taken internally: Secondary | ICD-10-CM | POA: Insufficient documentation

## 2017-11-16 DIAGNOSIS — I10 Essential (primary) hypertension: Secondary | ICD-10-CM | POA: Diagnosis not present

## 2017-11-16 DIAGNOSIS — Z7952 Long term (current) use of systemic steroids: Secondary | ICD-10-CM | POA: Diagnosis not present

## 2017-11-16 DIAGNOSIS — G43909 Migraine, unspecified, not intractable, without status migrainosus: Secondary | ICD-10-CM | POA: Diagnosis not present

## 2017-11-16 DIAGNOSIS — R7303 Prediabetes: Secondary | ICD-10-CM | POA: Diagnosis not present

## 2017-11-16 DIAGNOSIS — F0390 Unspecified dementia without behavioral disturbance: Secondary | ICD-10-CM | POA: Diagnosis not present

## 2017-11-16 DIAGNOSIS — F329 Major depressive disorder, single episode, unspecified: Secondary | ICD-10-CM | POA: Diagnosis not present

## 2017-11-16 DIAGNOSIS — G40909 Epilepsy, unspecified, not intractable, without status epilepticus: Secondary | ICD-10-CM | POA: Diagnosis not present

## 2017-11-19 DIAGNOSIS — G40909 Epilepsy, unspecified, not intractable, without status epilepticus: Secondary | ICD-10-CM | POA: Diagnosis not present

## 2017-11-19 DIAGNOSIS — F329 Major depressive disorder, single episode, unspecified: Secondary | ICD-10-CM | POA: Diagnosis not present

## 2017-11-19 DIAGNOSIS — I10 Essential (primary) hypertension: Secondary | ICD-10-CM | POA: Diagnosis not present

## 2017-11-19 DIAGNOSIS — F0390 Unspecified dementia without behavioral disturbance: Secondary | ICD-10-CM | POA: Diagnosis not present

## 2017-11-19 DIAGNOSIS — G43909 Migraine, unspecified, not intractable, without status migrainosus: Secondary | ICD-10-CM | POA: Diagnosis not present

## 2017-11-19 DIAGNOSIS — R7303 Prediabetes: Secondary | ICD-10-CM | POA: Diagnosis not present

## 2017-11-20 DIAGNOSIS — N182 Chronic kidney disease, stage 2 (mild): Secondary | ICD-10-CM | POA: Diagnosis not present

## 2017-11-20 DIAGNOSIS — E119 Type 2 diabetes mellitus without complications: Secondary | ICD-10-CM | POA: Diagnosis not present

## 2017-11-20 DIAGNOSIS — G40909 Epilepsy, unspecified, not intractable, without status epilepticus: Secondary | ICD-10-CM | POA: Diagnosis not present

## 2017-11-20 DIAGNOSIS — I10 Essential (primary) hypertension: Secondary | ICD-10-CM | POA: Diagnosis not present

## 2017-11-20 DIAGNOSIS — E785 Hyperlipidemia, unspecified: Secondary | ICD-10-CM | POA: Diagnosis not present

## 2017-11-20 DIAGNOSIS — G43909 Migraine, unspecified, not intractable, without status migrainosus: Secondary | ICD-10-CM | POA: Diagnosis not present

## 2017-11-20 DIAGNOSIS — Z1211 Encounter for screening for malignant neoplasm of colon: Secondary | ICD-10-CM | POA: Diagnosis not present

## 2017-11-20 DIAGNOSIS — Z0000 Encounter for general adult medical examination without abnormal findings: Secondary | ICD-10-CM | POA: Diagnosis not present

## 2017-11-20 DIAGNOSIS — Z1389 Encounter for screening for other disorder: Secondary | ICD-10-CM | POA: Diagnosis not present

## 2017-11-20 DIAGNOSIS — K219 Gastro-esophageal reflux disease without esophagitis: Secondary | ICD-10-CM | POA: Diagnosis not present

## 2017-11-22 DIAGNOSIS — L309 Dermatitis, unspecified: Secondary | ICD-10-CM | POA: Diagnosis not present

## 2017-11-22 DIAGNOSIS — L853 Xerosis cutis: Secondary | ICD-10-CM | POA: Diagnosis not present

## 2017-11-25 DIAGNOSIS — G40909 Epilepsy, unspecified, not intractable, without status epilepticus: Secondary | ICD-10-CM | POA: Diagnosis not present

## 2017-11-25 DIAGNOSIS — F329 Major depressive disorder, single episode, unspecified: Secondary | ICD-10-CM | POA: Diagnosis not present

## 2017-11-25 DIAGNOSIS — F0390 Unspecified dementia without behavioral disturbance: Secondary | ICD-10-CM | POA: Diagnosis not present

## 2017-11-25 DIAGNOSIS — G43909 Migraine, unspecified, not intractable, without status migrainosus: Secondary | ICD-10-CM | POA: Diagnosis not present

## 2017-11-25 DIAGNOSIS — R7303 Prediabetes: Secondary | ICD-10-CM | POA: Diagnosis not present

## 2017-11-25 DIAGNOSIS — I10 Essential (primary) hypertension: Secondary | ICD-10-CM | POA: Diagnosis not present

## 2017-11-28 DIAGNOSIS — F0390 Unspecified dementia without behavioral disturbance: Secondary | ICD-10-CM | POA: Diagnosis not present

## 2017-11-28 DIAGNOSIS — G40909 Epilepsy, unspecified, not intractable, without status epilepticus: Secondary | ICD-10-CM | POA: Diagnosis not present

## 2017-11-28 DIAGNOSIS — I10 Essential (primary) hypertension: Secondary | ICD-10-CM | POA: Diagnosis not present

## 2017-11-28 DIAGNOSIS — R7303 Prediabetes: Secondary | ICD-10-CM | POA: Diagnosis not present

## 2017-11-28 DIAGNOSIS — G43909 Migraine, unspecified, not intractable, without status migrainosus: Secondary | ICD-10-CM | POA: Diagnosis not present

## 2017-11-28 DIAGNOSIS — F329 Major depressive disorder, single episode, unspecified: Secondary | ICD-10-CM | POA: Diagnosis not present

## 2017-12-03 DIAGNOSIS — G43019 Migraine without aura, intractable, without status migrainosus: Secondary | ICD-10-CM | POA: Diagnosis not present

## 2017-12-05 DIAGNOSIS — G40909 Epilepsy, unspecified, not intractable, without status epilepticus: Secondary | ICD-10-CM | POA: Diagnosis not present

## 2017-12-05 DIAGNOSIS — F0390 Unspecified dementia without behavioral disturbance: Secondary | ICD-10-CM | POA: Diagnosis not present

## 2017-12-05 DIAGNOSIS — R7303 Prediabetes: Secondary | ICD-10-CM | POA: Diagnosis not present

## 2017-12-05 DIAGNOSIS — F329 Major depressive disorder, single episode, unspecified: Secondary | ICD-10-CM | POA: Diagnosis not present

## 2017-12-05 DIAGNOSIS — G43909 Migraine, unspecified, not intractable, without status migrainosus: Secondary | ICD-10-CM | POA: Diagnosis not present

## 2017-12-05 DIAGNOSIS — I10 Essential (primary) hypertension: Secondary | ICD-10-CM | POA: Diagnosis not present

## 2017-12-31 DIAGNOSIS — R300 Dysuria: Secondary | ICD-10-CM | POA: Diagnosis not present

## 2017-12-31 DIAGNOSIS — N3000 Acute cystitis without hematuria: Secondary | ICD-10-CM | POA: Diagnosis not present

## 2017-12-31 DIAGNOSIS — L292 Pruritus vulvae: Secondary | ICD-10-CM | POA: Diagnosis not present

## 2018-01-14 DIAGNOSIS — Z4802 Encounter for removal of sutures: Secondary | ICD-10-CM | POA: Diagnosis not present

## 2018-01-15 DIAGNOSIS — Z4802 Encounter for removal of sutures: Secondary | ICD-10-CM | POA: Insufficient documentation

## 2018-01-28 DIAGNOSIS — M71571 Other bursitis, not elsewhere classified, right ankle and foot: Secondary | ICD-10-CM | POA: Diagnosis not present

## 2018-01-28 DIAGNOSIS — M7672 Peroneal tendinitis, left leg: Secondary | ICD-10-CM | POA: Diagnosis not present

## 2018-01-28 DIAGNOSIS — M25774 Osteophyte, right foot: Secondary | ICD-10-CM | POA: Diagnosis not present

## 2018-01-28 DIAGNOSIS — M7671 Peroneal tendinitis, right leg: Secondary | ICD-10-CM | POA: Diagnosis not present

## 2018-01-28 DIAGNOSIS — M71572 Other bursitis, not elsewhere classified, left ankle and foot: Secondary | ICD-10-CM | POA: Diagnosis not present

## 2018-02-24 DIAGNOSIS — Z7984 Long term (current) use of oral hypoglycemic drugs: Secondary | ICD-10-CM | POA: Diagnosis not present

## 2018-02-24 DIAGNOSIS — E119 Type 2 diabetes mellitus without complications: Secondary | ICD-10-CM | POA: Diagnosis not present

## 2018-03-06 DIAGNOSIS — G5791 Unspecified mononeuropathy of right lower limb: Secondary | ICD-10-CM | POA: Diagnosis not present

## 2018-03-06 DIAGNOSIS — M19071 Primary osteoarthritis, right ankle and foot: Secondary | ICD-10-CM | POA: Diagnosis not present

## 2018-03-06 DIAGNOSIS — M19072 Primary osteoarthritis, left ankle and foot: Secondary | ICD-10-CM | POA: Diagnosis not present

## 2018-03-11 DIAGNOSIS — M8588 Other specified disorders of bone density and structure, other site: Secondary | ICD-10-CM | POA: Diagnosis not present

## 2018-03-12 DIAGNOSIS — M25774 Osteophyte, right foot: Secondary | ICD-10-CM | POA: Diagnosis not present

## 2018-03-12 DIAGNOSIS — Z01812 Encounter for preprocedural laboratory examination: Secondary | ICD-10-CM | POA: Diagnosis not present

## 2018-03-18 DIAGNOSIS — R198 Other specified symptoms and signs involving the digestive system and abdomen: Secondary | ICD-10-CM | POA: Diagnosis not present

## 2018-03-18 DIAGNOSIS — D509 Iron deficiency anemia, unspecified: Secondary | ICD-10-CM | POA: Diagnosis not present

## 2018-03-19 DIAGNOSIS — M25774 Osteophyte, right foot: Secondary | ICD-10-CM | POA: Diagnosis not present

## 2018-03-19 DIAGNOSIS — M2041 Other hammer toe(s) (acquired), right foot: Secondary | ICD-10-CM | POA: Diagnosis not present

## 2018-03-21 DIAGNOSIS — Z5329 Procedure and treatment not carried out because of patient's decision for other reasons: Secondary | ICD-10-CM | POA: Diagnosis not present

## 2018-04-01 DIAGNOSIS — G43019 Migraine without aura, intractable, without status migrainosus: Secondary | ICD-10-CM | POA: Diagnosis not present

## 2018-04-04 DIAGNOSIS — D509 Iron deficiency anemia, unspecified: Secondary | ICD-10-CM | POA: Diagnosis not present

## 2018-04-28 DIAGNOSIS — L910 Hypertrophic scar: Secondary | ICD-10-CM | POA: Diagnosis not present

## 2018-05-03 DIAGNOSIS — G43909 Migraine, unspecified, not intractable, without status migrainosus: Secondary | ICD-10-CM | POA: Diagnosis not present

## 2018-05-22 DIAGNOSIS — R350 Frequency of micturition: Secondary | ICD-10-CM | POA: Diagnosis not present

## 2018-05-22 DIAGNOSIS — R300 Dysuria: Secondary | ICD-10-CM | POA: Diagnosis not present

## 2018-06-17 DIAGNOSIS — Z0001 Encounter for general adult medical examination with abnormal findings: Secondary | ICD-10-CM | POA: Diagnosis not present

## 2018-06-17 DIAGNOSIS — Z1389 Encounter for screening for other disorder: Secondary | ICD-10-CM | POA: Diagnosis not present

## 2018-06-17 DIAGNOSIS — Z7984 Long term (current) use of oral hypoglycemic drugs: Secondary | ICD-10-CM | POA: Diagnosis not present

## 2018-06-17 DIAGNOSIS — I10 Essential (primary) hypertension: Secondary | ICD-10-CM | POA: Diagnosis not present

## 2018-06-17 DIAGNOSIS — K219 Gastro-esophageal reflux disease without esophagitis: Secondary | ICD-10-CM | POA: Diagnosis not present

## 2018-06-17 DIAGNOSIS — Z23 Encounter for immunization: Secondary | ICD-10-CM | POA: Diagnosis not present

## 2018-06-17 DIAGNOSIS — E785 Hyperlipidemia, unspecified: Secondary | ICD-10-CM | POA: Diagnosis not present

## 2018-06-17 DIAGNOSIS — E1169 Type 2 diabetes mellitus with other specified complication: Secondary | ICD-10-CM | POA: Diagnosis not present

## 2018-06-17 DIAGNOSIS — G43909 Migraine, unspecified, not intractable, without status migrainosus: Secondary | ICD-10-CM | POA: Diagnosis not present

## 2018-06-18 DIAGNOSIS — G43019 Migraine without aura, intractable, without status migrainosus: Secondary | ICD-10-CM | POA: Diagnosis not present

## 2018-06-24 DIAGNOSIS — G43709 Chronic migraine without aura, not intractable, without status migrainosus: Secondary | ICD-10-CM | POA: Diagnosis not present

## 2018-07-08 DIAGNOSIS — G43909 Migraine, unspecified, not intractable, without status migrainosus: Secondary | ICD-10-CM | POA: Diagnosis not present

## 2018-07-14 DIAGNOSIS — G43709 Chronic migraine without aura, not intractable, without status migrainosus: Secondary | ICD-10-CM | POA: Diagnosis not present

## 2018-07-16 DIAGNOSIS — R112 Nausea with vomiting, unspecified: Secondary | ICD-10-CM | POA: Diagnosis not present

## 2018-07-21 DIAGNOSIS — G43709 Chronic migraine without aura, not intractable, without status migrainosus: Secondary | ICD-10-CM | POA: Diagnosis not present

## 2018-07-23 DIAGNOSIS — G43019 Migraine without aura, intractable, without status migrainosus: Secondary | ICD-10-CM | POA: Diagnosis not present

## 2018-07-30 DIAGNOSIS — R51 Headache: Secondary | ICD-10-CM | POA: Diagnosis not present

## 2018-07-30 DIAGNOSIS — G8929 Other chronic pain: Secondary | ICD-10-CM | POA: Diagnosis not present

## 2018-08-04 ENCOUNTER — Emergency Department (HOSPITAL_COMMUNITY): Payer: Medicare Other

## 2018-08-04 ENCOUNTER — Encounter (HOSPITAL_COMMUNITY): Payer: Self-pay | Admitting: Emergency Medicine

## 2018-08-04 ENCOUNTER — Emergency Department (HOSPITAL_COMMUNITY)
Admission: EM | Admit: 2018-08-04 | Discharge: 2018-08-04 | Disposition: A | Discharge status: Home / Self Care | Payer: Medicare Other | Attending: Emergency Medicine | Admitting: Emergency Medicine

## 2018-08-04 DIAGNOSIS — R51 Headache: Secondary | ICD-10-CM | POA: Insufficient documentation

## 2018-08-04 DIAGNOSIS — K4090 Unilateral inguinal hernia, without obstruction or gangrene, not specified as recurrent: Secondary | ICD-10-CM | POA: Diagnosis not present

## 2018-08-04 DIAGNOSIS — Z79899 Other long term (current) drug therapy: Secondary | ICD-10-CM | POA: Diagnosis not present

## 2018-08-04 DIAGNOSIS — F0390 Unspecified dementia without behavioral disturbance: Secondary | ICD-10-CM | POA: Diagnosis not present

## 2018-08-04 DIAGNOSIS — I10 Essential (primary) hypertension: Secondary | ICD-10-CM | POA: Diagnosis not present

## 2018-08-04 DIAGNOSIS — G43709 Chronic migraine without aura, not intractable, without status migrainosus: Secondary | ICD-10-CM | POA: Diagnosis not present

## 2018-08-04 DIAGNOSIS — K5900 Constipation, unspecified: Secondary | ICD-10-CM | POA: Diagnosis not present

## 2018-08-04 DIAGNOSIS — G8929 Other chronic pain: Secondary | ICD-10-CM

## 2018-08-04 HISTORY — DX: Migraine, unspecified, not intractable, without status migrainosus: G43.909

## 2018-08-04 LAB — COMPREHENSIVE METABOLIC PANEL
ALT: 25 U/L (ref 0–44)
AST: 31 U/L (ref 15–41)
Albumin: 4.1 g/dL (ref 3.5–5.0)
Alkaline Phosphatase: 53 U/L (ref 38–126)
Anion gap: 12 (ref 5–15)
BUN: 17 mg/dL (ref 8–23)
CO2: 23 mmol/L (ref 22–32)
Calcium: 9.8 mg/dL (ref 8.9–10.3)
Chloride: 102 mmol/L (ref 98–111)
Creatinine, Ser: 1.16 mg/dL — ABNORMAL HIGH (ref 0.44–1.00)
GFR calc Af Amer: 55 mL/min — ABNORMAL LOW (ref >60)
GFR calc non Af Amer: 47 mL/min — ABNORMAL LOW (ref >60)
Glucose, Bld: 79 mg/dL (ref 70–99)
Potassium: 4.4 mmol/L (ref 3.5–5.1)
Sodium: 137 mmol/L (ref 135–145)
Total Bilirubin: 0.5 mg/dL (ref 0.3–1.2)
Total Protein: 7.4 g/dL (ref 6.5–8.1)

## 2018-08-04 LAB — CBC WITH DIFFERENTIAL/PLATELET
Abs Immature Granulocytes: 0.1 10*3/uL (ref 0.0–0.1)
Basophils Absolute: 0 10*3/uL (ref 0.0–0.1)
Basophils Relative: 0 %
Eosinophils Absolute: 0 10*3/uL (ref 0.0–0.7)
Eosinophils Relative: 0 %
HCT: 40.8 % (ref 36.0–46.0)
Hemoglobin: 12.8 g/dL (ref 12.0–15.0)
Immature Granulocytes: 1 %
Lymphocytes Relative: 45 %
Lymphs Abs: 4.4 10*3/uL — ABNORMAL HIGH (ref 0.7–4.0)
MCH: 29.5 pg (ref 26.0–34.0)
MCHC: 31.4 g/dL (ref 30.0–36.0)
MCV: 94 fL (ref 78.0–100.0)
Monocytes Absolute: 1.1 10*3/uL — ABNORMAL HIGH (ref 0.1–1.0)
Monocytes Relative: 11 %
Neutro Abs: 4.3 10*3/uL (ref 1.7–7.7)
Neutrophils Relative %: 43 %
Platelets: 329 10*3/uL (ref 150–400)
RBC: 4.34 MIL/uL (ref 3.87–5.11)
RDW: 14.1 % (ref 11.5–15.5)
WBC: 10 10*3/uL (ref 4.0–10.5)

## 2018-08-04 LAB — URINALYSIS, ROUTINE W REFLEX MICROSCOPIC
Bilirubin Urine: NEGATIVE
Glucose, UA: NEGATIVE mg/dL
Hgb urine dipstick: NEGATIVE
Ketones, ur: 5 mg/dL — AB
Leukocytes, UA: NEGATIVE
Nitrite: NEGATIVE
Protein, ur: NEGATIVE mg/dL
Specific Gravity, Urine: 1.04 — ABNORMAL HIGH (ref 1.005–1.030)
pH: 7 (ref 5.0–8.0)

## 2018-08-04 LAB — LIPASE, BLOOD: Lipase: 50 U/L (ref 11–51)

## 2018-08-04 MED ORDER — PROCHLORPERAZINE EDISYLATE 10 MG/2ML IJ SOLN
5.0000 mg | Freq: Once | INTRAMUSCULAR | Status: AC
  Administered 2018-08-04: 5 mg via INTRAVENOUS
  Filled 2018-08-04: qty 2

## 2018-08-04 MED ORDER — HYDROCODONE-ACETAMINOPHEN 5-325 MG PO TABS: 1.0000 | ORAL_TABLET | Freq: Four times a day (QID) | ORAL | 0 refills | Status: DC | PRN

## 2018-08-04 MED ORDER — SODIUM CHLORIDE 0.9 % IV BOLUS
500.0000 mL | Freq: Once | INTRAVENOUS | Status: AC
  Administered 2018-08-04: 500 mL via INTRAVENOUS

## 2018-08-04 MED ORDER — HYDROCODONE-ACETAMINOPHEN 5-325 MG PO TABS
1.0000 | ORAL_TABLET | Freq: Once | ORAL | Status: AC
Start: 2018-08-04 — End: 2018-08-04
  Administered 2018-08-04: 1 via ORAL
  Filled 2018-08-04: qty 1

## 2018-08-04 MED ORDER — DOCUSATE SODIUM 100 MG PO CAPS: 100.0000 mg | ORAL_CAPSULE | Freq: Two times a day (BID) | ORAL | 0 refills | Status: DC | PRN

## 2018-08-04 MED ORDER — IOHEXOL 300 MG/ML  SOLN
100.0000 mL | Freq: Once | INTRAMUSCULAR | Status: AC | PRN
  Administered 2018-08-04: 100 mL via INTRAVENOUS

## 2018-08-04 MED ORDER — FENTANYL CITRATE (PF) 100 MCG/2ML IJ SOLN
25.0000 ug | Freq: Once | INTRAMUSCULAR | Status: AC
  Administered 2018-08-04: 25 ug via INTRAVENOUS
  Filled 2018-08-04: qty 2

## 2018-08-04 MED ORDER — PROCHLORPERAZINE MALEATE 10 MG PO TABS: 10.0000 mg | ORAL_TABLET | Freq: Two times a day (BID) | ORAL | 0 refills | Status: DC | PRN

## 2018-08-04 NOTE — ED Provider Notes (Signed)
[facility] EMERGENCY DEPARTMENT Provider Note   CSN: [id number] Arrival date & time: 08/04/18  1312     History   Chief Complaint Chief Complaint  Patient presents with  . Migraine    HPI [name] is a 67 y.o. female.  HPI Patient presents with 1 week of left-sided headache.  Endorses nausea and photophobia.  States symptoms are similar to previous migraines.  No new weakness or numbness.  Patient is also having abdominal pain but is unsure how long that is been present.  States she has urinary frequency but denies dysuria.  She has not had a bowel movement in roughly 1 week. Past Medical History:  Diagnosis Date  . Dementia   . Depression   . Epilepsy, grand mal ([place])    "last seizure ~ 2001" (06/16/2015)  . GERD (gastroesophageal reflux disease)   . High cholesterol   . Hypertension   . Migraine    "just about qd";  treated by Dr. [name] (10/15/2016)  . Migraines   . Pre-diabetes    she reports that she never took meds. for prediabetes, states MD has told her that she has corrected her situation with diet   . Seizures ([place])    "at one time; stopped in the 1990s" (10/15/2016)  . Stress at home    Pt. reports that she has a lot of personal stress & she is aware that it might being playing a part in her Migraine heaaches      Patient Active Problem List   Diagnosis Date Noted  . Altered mental status 11/08/2017  . Acute encephalopathy 11/07/2017  . Seizure disorder ([place]) 11/07/2017  . Dementia 11/07/2017  . Essential hypertension 11/07/2017  . Headaches, cluster 11/07/2017  . AKI (acute kidney injury) ([place]) 11/07/2017  . Polypharmacy 05/15/2017  . Meningioma ([place]) 01/09/2016  . Depression 11/08/2015  . Abnormal head movements 11/08/2015  . Somnolence, daytime 07/14/2015  . Incisional hernia 06/16/2015  . Chronic headaches 04/01/2013    Past Surgical History:  Procedure Laterality Date  . HERNIA REPAIR    . INCISIONAL HERNIA REPAIR N/A  06/16/2015   Procedure: LAPAROSCOPIC INCISIONAL HERNIA WITH MESH;  Surgeon: Coralie Keens, MD;  Location: [facility];  Service: General;  Laterality: N/A;  . [place]  10/15/2016  . INCISIONAL HERNIA REPAIR N/A 10/15/2016   Procedure: OPEN INCISIONAL HERNIA REPAIR WITH MESH;  Surgeon: Coralie Keens, MD;  Location: [facility];  Service: General;  Laterality: N/A;  . INSERTION OF MESH N/A 06/16/2015   Procedure: INSERTION OF MESH;  Surgeon: Coralie Keens, MD;  Location: [facility];  Service: General;  Laterality: N/A;  . VAGINAL HYSTERECTOMY  ~ 1992     OB History   None      Home Medications    Prior to Admission medications   Medication Sig Start Date End Date Taking? Authorizing Provider  baclofen (LIORESAL) 10 MG tablet Take 10-20 mg by mouth 3 (three) times daily as needed for migraine or pain. 07/10/18  Yes [provider]  acetaminophen (TYLENOL) 500 MG tablet Take 1,000 mg by mouth every 6 (six) hours as needed for mild pain.    [provider]  amLODipine (NORVASC) 5 MG tablet Take 5 mg by mouth daily after breakfast.     [provider]  atorvastatin (LIPITOR) 40 MG tablet Take 40 mg by mouth daily after breakfast.     [provider]  Calcium Carb-Cholecalciferol (CALCIUM 600 + D) 600-200 MG-UNIT  TABS Take 1 tablet by mouth daily.    [provider]  carbamazepine (TEGRETOL XR) 200 MG 12 hr tablet Take 200 mg at bedtime by mouth. 10/23/17   [provider]  conjugated estrogens (PREMARIN) vaginal cream Place 1.5 g daily vaginally. 08/27/17   [provider]  diltiazem (DILTIAZEM CD) 120 MG 24 hr capsule Take 120 mg by mouth daily.    [provider]  diphenhydrAMINE (BENADRYL) 25 mg capsule Take 1 capsule (25 mg total) by mouth every 6 (six) hours as needed. 11/12/17   Jola Schmidt, MD  divalproex (DEPAKOTE ER) 500 MG 24 hr tablet TAKE 1 TABLET BY MOUTH EVERY MORNING AND TAKE 2 TABLETS BY MOUTH EVERY  EVENING Patient taking differently: TAKE 500 mg TABLET BY MOUTH EVERY MORNING AND TAKE 1000 mg  TABLETS BY MOUTH EVERY EVENING 10/11/16   Marcial Pacas, MD  docusate sodium (COLACE) 100 MG capsule Take 1 capsule (100 mg total) by mouth 2 (two) times daily as needed for mild constipation or moderate constipation. 08/04/18   Julianne Rice, MD  esomeprazole (NEXIUM) 40 MG capsule Take 40 mg by mouth daily after breakfast.  07/13/15   [provider]  famotidine (PEPCID) 20 MG tablet Take 1 tablet (20 mg total) by mouth 2 (two) times daily. Patient not taking: Reported on 11/13/2017 11/12/17   Jola Schmidt, MD  ferrous sulfate 325 (65 FE) MG tablet Take 325 mg by mouth daily with breakfast.    [provider]  HYDROcodone-acetaminophen (NORCO) 5-325 MG tablet Take 1 tablet by mouth every 6 (six) hours as needed for severe pain. 08/04/18   Julianne Rice, MD  magnesium oxide (MAG-OX) 400 MG tablet Take 400 mg by mouth 2 (two) times daily.     [provider]  Multiple Vitamin (MULTIVITAMIN WITH MINERALS) TABS Take 1 tablet by mouth daily.    [provider]  MYRBETRIQ 50 MG TB24 tablet Take 50 mg daily by mouth. 09/23/17   [provider]  predniSONE (DELTASONE) 10 MG tablet Take 2 tablets (20 mg total) by mouth daily. Patient not taking: Reported on 11/13/2017 11/12/17   Jola Schmidt, MD  prochlorperazine (COMPAZINE) 10 MG tablet Take 1 tablet (10 mg total) by mouth 2 (two) times daily as needed for nausea (headaches). 08/04/18   Julianne Rice, MD  topiramate (TOPAMAX) 100 MG tablet Take 100 mg 2 (two) times daily by mouth. 07/08/14   [provider]  TOVIAZ 8 MG TB24 tablet Take 8 mg by mouth at bedtime.  10/02/16   [provider]  traMADol (ULTRAM) 50 MG tablet Take 1 tablet (50 mg total) by mouth every 6 (six) hours as needed. Patient taking differently: Take 50 mg every 6 (six) hours as needed by mouth for moderate pain.  05/15/17    Marcial Pacas, MD  vitamin B-12 (CYANOCOBALAMIN) 100 MCG tablet Take 1,000 mcg daily by mouth.    [provider]    Family History Family History  Problem Relation Age of Onset  . Lung cancer Mother   . Kidney failure Father   . Hypertension Other     Social History Social History   Tobacco Use  . Smoking status: Never Smoker  . Smokeless tobacco: Never Used  Substance Use Topics  . Alcohol use: No    Alcohol/week: 0.0 standard drinks  . Drug use: No     Allergies   Nsaids; Aspirin; and Tolmetin   Review of Systems Review of Systems  Constitutional: Negative  for chills and fever.  HENT: Negative for sore throat and trouble swallowing.   Eyes: Positive for photophobia. Negative for pain.  Respiratory: Negative for cough and shortness of breath.   Gastrointestinal: Positive for abdominal pain, constipation and nausea. Negative for blood in stool and diarrhea.  Genitourinary: Positive for frequency. Negative for dysuria and flank pain.  Musculoskeletal: Negative for back pain, myalgias and neck pain.  Skin: Negative for rash and wound.  Neurological: Positive for headaches. Negative for weakness, light-headedness and numbness.  All other systems reviewed and are negative.    Physical Exam Updated Vital Signs BP 129/65 (BP Location: Right Arm)   Pulse 89   Temp 97.8 F (36.6 C) (Oral)   Resp 12   SpO2 100%   Physical Exam  Constitutional: She is oriented to person, place, and time. She appears well-developed and well-nourished. No distress.  HENT:  Head: Normocephalic and atraumatic.  Mouth/Throat: Oropharynx is clear and moist.  Cranial nerves II through XII grossly intact.  Eyes: Pupils are equal, round, and reactive to light. Conjunctivae and EOM are normal.  Neck: Normal range of motion. Neck supple. No JVD present. No tracheal deviation present. No thyromegaly present.  Cardiovascular: Normal rate and regular rhythm. Exam reveals no gallop and no  friction rub.  No murmur heard. Pulmonary/Chest: Effort normal and breath sounds normal.  Abdominal: Soft. Bowel sounds are normal. There is tenderness. There is no rebound and no guarding.  Left lower quadrant tenderness to palpation.  No rebound or guarding.  Musculoskeletal: Normal range of motion. She exhibits no edema or tenderness.  No midline thoracic or lumbar tenderness.  No CVA tenderness.  No lower extremity swelling, asymmetry or tenderness.  Lymphadenopathy:    She has no cervical adenopathy.  Neurological: She is alert and oriented to person, place, and time.  Patient is alert and oriented x3 with clear, goal oriented speech. Patient has 5/5 motor in all extremities. Sensation is intact to light touch. Bilateral finger-to-nose is normal with no signs of dysmetria.  Skin: Skin is warm and dry. Capillary refill takes less than 2 seconds. No rash noted. She is not diaphoretic. No erythema.  Psychiatric: Her behavior is normal.  Anxious appearing  Nursing note and vitals reviewed.    ED Treatments / Results  Labs (all labs ordered are listed, but only abnormal results are displayed) Labs Reviewed  CBC WITH DIFFERENTIAL/PLATELET - Abnormal; Notable for the following components:      Result Value   Lymphs Abs 4.4 (*)    Monocytes Absolute 1.1 (*)    All other components within normal limits  COMPREHENSIVE METABOLIC PANEL - Abnormal; Notable for the following components:   Creatinine, Ser 1.16 (*)    GFR calc non Af Amer 47 (*)    GFR calc Af Amer 55 (*)    All other components within normal limits  URINALYSIS, ROUTINE W REFLEX MICROSCOPIC - Abnormal; Notable for the following components:   Specific Gravity, Urine 1.040 (*)    Ketones, ur 5 (*)    All other components within normal limits  LIPASE, BLOOD    EKG None  Radiology Ct Abdomen Pelvis W Contrast  Result Date: 08/04/2018 CLINICAL DATA:  Abdominal pain, nausea, vomiting. EXAM: CT ABDOMEN AND PELVIS WITH  CONTRAST TECHNIQUE: Multidetector CT imaging of the abdomen and pelvis was performed using the standard protocol following bolus administration of intravenous contrast. CONTRAST:  15mL OMNIPAQUE IOHEXOL 300 MG/ML  SOLN COMPARISON:  11/12/2017 FINDINGS: Lower chest: Lung  bases are clear. No effusions. Heart is normal size. Hepatobiliary: Several small gallstones layering within the gallbladder. Gallbladder is distended. 17 mm cyst in the inferior right hepatic lobe. Pancreas: No focal abnormality or ductal dilatation. Spleen: No focal abnormality.  Normal size. Adrenals/Urinary Tract: No adrenal abnormality. No focal renal abnormality. No stones or hydronephrosis. Urinary bladder is unremarkable. Stomach/Bowel: There is a left inguinal hernia containing sigmoid colon. No evidence of bowel obstruction. Moderate stool throughout the colon. Normal appendix. Vascular/Lymphatic: Aortic atherosclerosis. No enlarged abdominal or pelvic lymph nodes. Reproductive: Prior hysterectomy.  No adnexal masses. Other: No free fluid or free air. Musculoskeletal: No acute bony abnormality. IMPRESSION: Gallbladder distention with multiple small layering gallstones. No visible ductal stones or biliary duct dilatation. Consider further evaluation with ultrasound if there is concern for cholecystitis. Moderate stool burden in the colon. Moderate-sized left inguinal hernia containing a loop of sigmoid colon. No evidence of bowel obstruction. Aortic atherosclerosis. Electronically Signed   By: Rolm Baptise M.D.   On: 08/04/2018 19:54    Procedures Procedures (including critical care time)  Medications Ordered in ED Medications  sodium chloride 0.9 % bolus 500 mL (0 mLs Intravenous Stopped 08/04/18 1716)  fentaNYL (SUBLIMAZE) injection 25 mcg (25 mcg Intravenous Given 08/04/18 1649)  prochlorperazine (COMPAZINE) injection 5 mg (5 mg Intravenous Given 08/04/18 1649)  fentaNYL (SUBLIMAZE) injection 25 mcg (25 mcg Intravenous Given  08/04/18 1826)  iohexol (OMNIPAQUE) 300 MG/ML solution 100 mL (100 mLs Intravenous Contrast Given 08/04/18 1930)  HYDROcodone-acetaminophen (NORCO/VICODIN) 5-325 MG per tablet 1 tablet (1 tablet Oral Given 08/04/18 2116)     Initial Impression / Assessment and Plan / ED Course  I have reviewed the triage vital signs and the nursing notes.  Pertinent labs & imaging results that were available during my care of the patient were reviewed by me and considered in my medical decision making (see chart for details).     Headache is significantly improved.  No focal neurologic deficits.  CT abdomen with inguinal hernia.  Soft no evidence of obstruction.  Patient's states she knows about the inguinal hernia and that this is chronic.  She will follow-up with her primary physician.  Return precautions given.  Final Clinical Impressions(s) / ED Diagnoses   Final diagnoses:  Chronic nonintractable headache, unspecified headache type  Unilateral inguinal hernia without obstruction or gangrene, recurrence not specified  Constipation, unspecified constipation type    ED Discharge Orders         Ordered    HYDROcodone-acetaminophen (NORCO) 5-325 MG tablet  Every 6 hours PRN     08/04/18 2114    prochlorperazine (COMPAZINE) 10 MG tablet  2 times daily PRN     08/04/18 2114    docusate sodium (COLACE) 100 MG capsule  2 times daily PRN     08/04/18 2114           Julianne Rice, MD 08/04/18 2116

## 2018-08-04 NOTE — ED Notes (Signed)
E signature pad unavailable. Pt verbalizes understanding of dc instructions and follow up care.

## 2018-08-04 NOTE — ED Notes (Signed)
Pt CBG 125

## 2018-08-04 NOTE — ED Triage Notes (Signed)
Patient complains of persistent migraine x2 weeks. Patient has history of migraines, has prescriptions but states home medications have not relieved migraine. Patient alert, oriented, and in no apparent distress at this time.

## 2018-08-04 NOTE — ED Notes (Signed)
Called lab to check on labs that were sent down at 1646. Advised that lab tech will receive now.

## 2018-08-04 NOTE — ED Notes (Signed)
Patient transported to CT 

## 2018-08-04 NOTE — ED Notes (Signed)
ED Provider at bedside. 

## 2018-08-06 DIAGNOSIS — R51 Headache: Secondary | ICD-10-CM | POA: Diagnosis not present

## 2018-08-13 DIAGNOSIS — R51 Headache: Secondary | ICD-10-CM | POA: Diagnosis not present

## 2018-08-14 ENCOUNTER — Encounter: Payer: Self-pay | Admitting: Gastroenterology

## 2018-08-18 DIAGNOSIS — G43709 Chronic migraine without aura, not intractable, without status migrainosus: Secondary | ICD-10-CM | POA: Diagnosis not present

## 2018-08-20 DIAGNOSIS — R51 Headache: Secondary | ICD-10-CM | POA: Diagnosis not present

## 2018-08-20 DIAGNOSIS — G8929 Other chronic pain: Secondary | ICD-10-CM | POA: Diagnosis not present

## 2018-08-27 DIAGNOSIS — R51 Headache: Secondary | ICD-10-CM | POA: Diagnosis not present

## 2018-08-27 DIAGNOSIS — G8929 Other chronic pain: Secondary | ICD-10-CM | POA: Diagnosis not present

## 2018-08-28 ENCOUNTER — Other Ambulatory Visit: Payer: Self-pay | Admitting: Family Medicine

## 2018-08-28 DIAGNOSIS — Z1231 Encounter for screening mammogram for malignant neoplasm of breast: Secondary | ICD-10-CM

## 2018-09-01 DIAGNOSIS — G44021 Chronic cluster headache, intractable: Secondary | ICD-10-CM | POA: Diagnosis not present

## 2018-09-04 DIAGNOSIS — R531 Weakness: Secondary | ICD-10-CM | POA: Diagnosis not present

## 2018-09-04 DIAGNOSIS — N3941 Urge incontinence: Secondary | ICD-10-CM | POA: Diagnosis not present

## 2018-09-04 DIAGNOSIS — E119 Type 2 diabetes mellitus without complications: Secondary | ICD-10-CM | POA: Diagnosis not present

## 2018-09-04 DIAGNOSIS — Z7984 Long term (current) use of oral hypoglycemic drugs: Secondary | ICD-10-CM | POA: Diagnosis not present

## 2018-09-04 DIAGNOSIS — R351 Nocturia: Secondary | ICD-10-CM | POA: Diagnosis not present

## 2018-09-04 DIAGNOSIS — R634 Abnormal weight loss: Secondary | ICD-10-CM | POA: Diagnosis not present

## 2018-09-04 DIAGNOSIS — Z79899 Other long term (current) drug therapy: Secondary | ICD-10-CM | POA: Diagnosis not present

## 2018-09-04 DIAGNOSIS — F3341 Major depressive disorder, recurrent, in partial remission: Secondary | ICD-10-CM | POA: Diagnosis not present

## 2018-09-05 DIAGNOSIS — G8929 Other chronic pain: Secondary | ICD-10-CM | POA: Diagnosis not present

## 2018-09-05 DIAGNOSIS — Z01411 Encounter for gynecological examination (general) (routine) with abnormal findings: Secondary | ICD-10-CM | POA: Diagnosis not present

## 2018-09-05 DIAGNOSIS — R51 Headache: Secondary | ICD-10-CM | POA: Diagnosis not present

## 2018-09-05 DIAGNOSIS — N9489 Other specified conditions associated with female genital organs and menstrual cycle: Secondary | ICD-10-CM | POA: Diagnosis not present

## 2018-09-05 DIAGNOSIS — K4090 Unilateral inguinal hernia, without obstruction or gangrene, not specified as recurrent: Secondary | ICD-10-CM | POA: Diagnosis not present

## 2018-09-09 DIAGNOSIS — N949 Unspecified condition associated with female genital organs and menstrual cycle: Secondary | ICD-10-CM | POA: Diagnosis not present

## 2018-09-12 DIAGNOSIS — G8929 Other chronic pain: Secondary | ICD-10-CM | POA: Diagnosis not present

## 2018-09-12 DIAGNOSIS — R51 Headache: Secondary | ICD-10-CM | POA: Diagnosis not present

## 2018-09-17 DIAGNOSIS — Z23 Encounter for immunization: Secondary | ICD-10-CM | POA: Diagnosis not present

## 2018-09-25 ENCOUNTER — Ambulatory Visit
Admission: RE | Admit: 2018-09-25 | Discharge: 2018-09-25 | Disposition: A | Discharge status: Home / Self Care | Payer: Medicare Other | Source: Ambulatory Visit | Attending: Family Medicine | Admitting: Family Medicine

## 2018-09-25 DIAGNOSIS — Z1231 Encounter for screening mammogram for malignant neoplasm of breast: Secondary | ICD-10-CM

## 2018-09-30 ENCOUNTER — Ambulatory Visit (INDEPENDENT_AMBULATORY_CARE_PROVIDER_SITE_OTHER): Payer: Medicare Other | Admitting: Gastroenterology

## 2018-09-30 ENCOUNTER — Encounter: Payer: Self-pay | Admitting: Gastroenterology

## 2018-09-30 VITALS — BP 100/68 | HR 62 | Ht 60.0 in | Wt 118.5 lb

## 2018-09-30 DIAGNOSIS — K469 Unspecified abdominal hernia without obstruction or gangrene: Secondary | ICD-10-CM

## 2018-09-30 NOTE — Patient Instructions (Addendum)
We will get records sent from your previous gastroenterologist Dr. Watt Climes for review.  This will include any endoscopic (colonoscopy or upper endoscopy) procedures and any associated pathology reports.    Referral back to [facility] for recurrent LLQ hernia; Dr. Rush Farmer.  Normal BMI (Body Mass Index- based on height and weight) is between 23 and 30. Your BMI today is Body mass index is 23.14 kg/m. Marland Kitchen Please consider follow up  regarding your BMI with your Primary Care Provider.   Thank you for entrusting me with your care and choosing [facility].  Dr Ardis Hughs

## 2018-09-30 NOTE — Progress Notes (Signed)
HPI: This is a very pleasant 67 year old woman who was referred to me by Carol Ada, MD  to evaluate left lower quadrant pains.    Chief complaint is left lower quadrant pains  She is here with her husband today  The pains is intermittent, LLQ.  She feels it most days of the week. It is dull, does not radiate. Last 2-3 minutes, several times throughout the week.  BMs does affect the pains. No fevers. Feels like a lump.    She has had recurrent incisional, inguinal hernia.  Most recent repair 2017.    Old Data Reviewed: CT scan abdomen and pelvis with IV and oral contrast August 2019: Gallbladder distention with multiple small layering gallstones. No visible ductal stones or biliary duct dilatation. Consider further evaluation with ultrasound if there is concern for cholecystitis. Moderate stool burden in the colon. Moderate-sized left inguinal hernia containing a loop of sigmoid colon. No evidence of bowel obstruction. Aortic atherosclerosis.     Review of systems: Pertinent positive and negative review of systems were noted in the above HPI section. All other review negative.   Past Medical History:  Diagnosis Date  . Dementia ([place])   . Depression   . Epilepsy, grand mal ([place])    "last seizure ~ 2001" (06/16/2015)  . GERD (gastroesophageal reflux disease)   . High cholesterol   . Hypertension   . Migraine    "just about qd";  treated by Dr. [name] (10/15/2016)  . Migraines   . Pre-diabetes    she reports that she never took meds. for prediabetes, states MD has told her that she has corrected her situation with diet   . Seizures ([place])    "at one time; stopped in the 1990s" (10/15/2016)  . Stress at home    Pt. reports that she has a lot of personal stress & she is aware that it might being playing a part in her Migraine heaaches      Past Surgical History:  Procedure Laterality Date  . HERNIA REPAIR    . INCISIONAL HERNIA REPAIR N/A 06/16/2015   Procedure:  LAPAROSCOPIC INCISIONAL HERNIA WITH MESH;  Surgeon: Coralie Keens, MD;  Location: [facility];  Service: General;  Laterality: N/A;  . [place]  10/15/2016  . INCISIONAL HERNIA REPAIR N/A 10/15/2016   Procedure: OPEN INCISIONAL HERNIA REPAIR WITH MESH;  Surgeon: Coralie Keens, MD;  Location: [facility];  Service: General;  Laterality: N/A;  . INSERTION OF MESH N/A 06/16/2015   Procedure: INSERTION OF MESH;  Surgeon: Coralie Keens, MD;  Location: [facility];  Service: General;  Laterality: N/A;  . VAGINAL HYSTERECTOMY  ~ 1992    Current Outpatient Medications  Medication Sig Dispense Refill  . acetaminophen (TYLENOL) 500 MG tablet Take 1,000 mg by mouth every 6 (six) hours as needed for mild pain.    Marland Kitchen amLODipine (NORVASC) 5 MG tablet Take 5 mg by mouth daily after breakfast.     . atorvastatin (LIPITOR) 40 MG tablet Take 40 mg by mouth daily after breakfast.     . baclofen (LIORESAL) 10 MG tablet Take 10-20 mg by mouth 3 (three) times daily as needed for migraine or pain.  2  . Calcium Carb-Cholecalciferol (CALCIUM 600 + D) 600-200 MG-UNIT TABS Take 1 tablet by mouth daily.    . carbamazepine (TEGRETOL XR) 200 MG 12 hr tablet Take 200 mg at bedtime by mouth.  3  . conjugated estrogens (PREMARIN) vaginal cream Place 1.5 g daily vaginally.    Marland Kitchen  divalproex (DEPAKOTE ER) 500 MG 24 hr tablet TAKE 1 TABLET BY MOUTH EVERY MORNING AND TAKE 2 TABLETS BY MOUTH EVERY EVENING (Patient taking differently: TAKE 500 mg TABLET BY MOUTH EVERY MORNING AND TAKE 1000 mg  TABLETS BY MOUTH EVERY EVENING) 270 tablet 3  . esomeprazole (NEXIUM) 40 MG capsule Take 40 mg by mouth daily after breakfast.     . ferrous sulfate 325 (65 FE) MG tablet Take 325 mg by mouth daily with breakfast.    . Multiple Vitamin (MULTIVITAMIN WITH MINERALS) TABS Take 1 tablet by mouth daily.    Marland Kitchen MYRBETRIQ 50 MG TB24 tablet Take 50 mg daily by mouth.  3  . predniSONE (DELTASONE) 10 MG tablet Take 2 tablets (20 mg total) by mouth  daily. 15 tablet 0  . topiramate (TOPAMAX) 100 MG tablet Take 100 mg by mouth daily.     . traMADol (ULTRAM) 50 MG tablet Take 1 tablet (50 mg total) by mouth every 6 (six) hours as needed. (Patient taking differently: Take 50 mg every 6 (six) hours as needed by mouth for moderate pain. ) 30 tablet 0  . vitamin B-12 (CYANOCOBALAMIN) 100 MCG tablet Take 1,000 mcg daily by mouth.     No current facility-administered medications for this visit.     Allergies as of 09/30/2018 - Review Complete 09/30/2018  Allergen Reaction Noted  . Nsaids Other (See Comments) 06/08/2015  . Aspirin Nausea Only and Other (See Comments) 10/11/2012  . Tolmetin Other (See Comments) 06/08/2015    Family History  Problem Relation Age of Onset  . Lung cancer Mother   . Kidney failure Father   . Hypertension Other     Social History   Socioeconomic History  . Marital status: Married    Spouse name: Elenore Rota  . Number of children: 3  . Years of education: college  . 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 Financial resource strain: Not on file  . Food insecurity:    Worry: Not on file    Inability: Not on file  . Transportation needs:    Medical: Not on file    Non-medical: Not on file  Tobacco Use  . Smoking status: Never Smoker  . Smokeless tobacco: Never Used  Substance and Sexual Activity  . Alcohol use: No    Alcohol/week: 0.0 standard drinks  . Drug use: No  . Sexual activity: Yes  Lifestyle  . Physical activity:    Days per week: Not on file    Minutes per session: Not on file  . Stress: Not on file  Relationships  . Social connections:    Talks on phone: Not on file    Gets together: Not on file    Attends religious service: Not on file    Active member of club or organization: Not on file    Attends meetings of clubs or organizations: Not on file    Relationship status: Not on file  . Intimate partner violence:     Fear of current or ex partner: Not on file    Emotionally abused: Not on file    Physically abused: Not on file    Forced sexual activity: Not on file  Other Topics Concern  . Not on file  Social History Narrative   Patient lives at home with her husband Elenore Rota). Patient was a homemaker.   Right handed.   College education.   Caffeine-  One cup of coffee daily.   Patient has three children.     Physical Exam: BP 100/68   Pulse 62   Ht 5' (1.524 m)   Wt 118 lb 8 oz (53.8 kg)   BMI 23.14 kg/m  Constitutional: generally well-appearing Psychiatric: alert and oriented x3 Eyes: extraocular movements intact Mouth: oral pharynx moist, no lesions Neck: supple no lymphadenopathy Cardiovascular: heart regular rate and rhythm Lungs: clear to auscultation bilaterally Abdomen: soft, nontender, nondistended, no obvious ascites, no peritoneal signs, normal bowel sounds; reducible left lower quadrant, left pelvic hernia mildly tender Extremities: no lower extremity edema bilaterally Skin: no lesions on visible extremities   Assessment and plan: 67 y.o. female with she has recurrent left pelvis, left lower quadrant hernia.  I am not sure if this is an inguinal hernia or incisional hernia.  Either way she has sigmoid colon within the hernia sac and I think this is causing intermittent symptoms.  CT scan 2018 and 2019 show it.  We will get records from her previous gastroenterologist Dr. Watt Climes sent here for review.  And I will also arrange for repeat visit at [facility] surgery to discuss the recurrent hernia.    Please see the "Patient Instructions" section for addition details about the plan.   Owens Loffler, MD [facility] 09/30/2018, 11:52 AM  Cc: Carol Ada, MD

## 2018-10-02 DIAGNOSIS — D509 Iron deficiency anemia, unspecified: Secondary | ICD-10-CM | POA: Diagnosis not present

## 2018-10-07 DIAGNOSIS — R634 Abnormal weight loss: Secondary | ICD-10-CM | POA: Diagnosis not present

## 2018-10-07 DIAGNOSIS — E785 Hyperlipidemia, unspecified: Secondary | ICD-10-CM | POA: Diagnosis not present

## 2018-10-07 DIAGNOSIS — G43909 Migraine, unspecified, not intractable, without status migrainosus: Secondary | ICD-10-CM | POA: Diagnosis not present

## 2018-10-07 DIAGNOSIS — F3341 Major depressive disorder, recurrent, in partial remission: Secondary | ICD-10-CM | POA: Diagnosis not present

## 2018-10-10 ENCOUNTER — Encounter (HOSPITAL_COMMUNITY): Payer: Self-pay

## 2018-10-10 ENCOUNTER — Emergency Department (HOSPITAL_COMMUNITY)
Admission: EM | Admit: 2018-10-10 | Discharge: 2018-10-10 | Disposition: A | Discharge status: Home / Self Care | Payer: Medicare Other | Attending: Emergency Medicine | Admitting: Emergency Medicine

## 2018-10-10 ENCOUNTER — Emergency Department (HOSPITAL_COMMUNITY): Payer: Medicare Other

## 2018-10-10 ENCOUNTER — Other Ambulatory Visit: Payer: Self-pay

## 2018-10-10 DIAGNOSIS — M25552 Pain in left hip: Secondary | ICD-10-CM | POA: Diagnosis not present

## 2018-10-10 DIAGNOSIS — S63502A Unspecified sprain of left wrist, initial encounter: Secondary | ICD-10-CM

## 2018-10-10 DIAGNOSIS — S7002XA Contusion of left hip, initial encounter: Secondary | ICD-10-CM | POA: Diagnosis not present

## 2018-10-10 DIAGNOSIS — M25561 Pain in right knee: Secondary | ICD-10-CM | POA: Diagnosis not present

## 2018-10-10 DIAGNOSIS — S0083XA Contusion of other part of head, initial encounter: Secondary | ICD-10-CM | POA: Insufficient documentation

## 2018-10-10 DIAGNOSIS — W0110XA Fall on same level from slipping, tripping and stumbling with subsequent striking against unspecified object, initial encounter: Secondary | ICD-10-CM | POA: Diagnosis not present

## 2018-10-10 DIAGNOSIS — E161 Other hypoglycemia: Secondary | ICD-10-CM | POA: Diagnosis not present

## 2018-10-10 DIAGNOSIS — Y92007 Garden or yard of unspecified non-institutional (private) residence as the place of occurrence of the external cause: Secondary | ICD-10-CM | POA: Diagnosis not present

## 2018-10-10 DIAGNOSIS — R58 Hemorrhage, not elsewhere classified: Secondary | ICD-10-CM | POA: Diagnosis not present

## 2018-10-10 DIAGNOSIS — Y999 Unspecified external cause status: Secondary | ICD-10-CM | POA: Insufficient documentation

## 2018-10-10 DIAGNOSIS — W19XXXA Unspecified fall, initial encounter: Secondary | ICD-10-CM

## 2018-10-10 DIAGNOSIS — F0390 Unspecified dementia without behavioral disturbance: Secondary | ICD-10-CM | POA: Insufficient documentation

## 2018-10-10 DIAGNOSIS — Z79899 Other long term (current) drug therapy: Secondary | ICD-10-CM | POA: Insufficient documentation

## 2018-10-10 DIAGNOSIS — E162 Hypoglycemia, unspecified: Secondary | ICD-10-CM | POA: Diagnosis not present

## 2018-10-10 DIAGNOSIS — S8991XA Unspecified injury of right lower leg, initial encounter: Secondary | ICD-10-CM | POA: Diagnosis not present

## 2018-10-10 DIAGNOSIS — S79912A Unspecified injury of left hip, initial encounter: Secondary | ICD-10-CM | POA: Diagnosis not present

## 2018-10-10 DIAGNOSIS — I10 Essential (primary) hypertension: Secondary | ICD-10-CM | POA: Diagnosis not present

## 2018-10-10 DIAGNOSIS — Y93H2 Activity, gardening and landscaping: Secondary | ICD-10-CM | POA: Diagnosis not present

## 2018-10-10 DIAGNOSIS — S6992XA Unspecified injury of left wrist, hand and finger(s), initial encounter: Secondary | ICD-10-CM | POA: Diagnosis not present

## 2018-10-10 NOTE — ED Triage Notes (Signed)
Pt from home after a fall while gardening. No blood thinners, A&Ox4. Did hit her head, no LOC.  Was outside for 30 min on the ground before husband found here.

## 2018-10-10 NOTE — ED Provider Notes (Signed)
[facility] EMERGENCY DEPARTMENT Provider Note   CSN: [id number] Arrival date & time: 10/10/18  1520     History   Chief Complaint Chief Complaint  Patient presents with  . Fall  . Head Injury    HPI [name] is a 67 y.o. female.  HPI Patient presents after mechanical fall.  States she was out trimming her hedges and lost her balance and fell.  Complaining of pain in the left wrist and right knee.  Denies loss of consciousness.  Does have a little bit of dementia history but patient is awake and appropriate now.  Patient's husband is here and states she is at her baseline.  She is not on anticoagulation.  History of seizures but states she did not have a seizure and no recent history of one. Past Medical History:  Diagnosis Date  . Dementia ([place])   . Depression   . Epilepsy, grand mal ([place])    "last seizure ~ 2001" (06/16/2015)  . GERD (gastroesophageal reflux disease)   . High cholesterol   . Hypertension   . Migraine    "just about qd";  treated by Dr. [name] (10/15/2016)  . Migraines   . Pre-diabetes    she reports that she never took meds. for prediabetes, states MD has told her that she has corrected her situation with diet   . Seizures ([place])    "at one time; stopped in the 1990s" (10/15/2016)  . Stress at home    Pt. reports that she has a lot of personal stress & she is aware that it might being playing a part in her Migraine heaaches      Patient Active Problem List   Diagnosis Date Noted  . Altered mental status 11/08/2017  . Acute encephalopathy 11/07/2017  . Seizure disorder ([place]) 11/07/2017  . Dementia ([place]) 11/07/2017  . Essential hypertension 11/07/2017  . Headaches, cluster 11/07/2017  . AKI (acute kidney injury) ([place]) 11/07/2017  . Polypharmacy 05/15/2017  . Meningioma ([place]) 01/09/2016  . Depression 11/08/2015  . Abnormal head movements 11/08/2015  . Somnolence, daytime 07/14/2015  . Incisional hernia 06/16/2015  . Chronic  headaches 04/01/2013    Past Surgical History:  Procedure Laterality Date  . HERNIA REPAIR    . INCISIONAL HERNIA REPAIR N/A 06/16/2015   Procedure: LAPAROSCOPIC INCISIONAL HERNIA WITH MESH;  Surgeon: Coralie Keens, MD;  Location: [facility];  Service: General;  Laterality: N/A;  . [place]  10/15/2016  . INCISIONAL HERNIA REPAIR N/A 10/15/2016   Procedure: OPEN INCISIONAL HERNIA REPAIR WITH MESH;  Surgeon: Coralie Keens, MD;  Location: [facility];  Service: General;  Laterality: N/A;  . INSERTION OF MESH N/A 06/16/2015   Procedure: INSERTION OF MESH;  Surgeon: Coralie Keens, MD;  Location: [facility];  Service: General;  Laterality: N/A;  . VAGINAL HYSTERECTOMY  ~ 1992     OB History   None      Home Medications    Prior to Admission medications   Medication Sig Start Date End Date Taking? Authorizing Provider  acetaminophen (TYLENOL) 500 MG tablet Take 1,000 mg by mouth every 6 (six) hours as needed for mild pain.    [provider]  amLODipine (NORVASC) 5 MG tablet Take 5 mg by mouth daily after breakfast.     [provider]  atorvastatin (LIPITOR) 40 MG tablet Take 40 mg by mouth daily after breakfast.     [provider]  baclofen (LIORESAL) 10 MG tablet Take  10-20 mg by mouth 3 (three) times daily as needed for migraine or pain. 07/10/18   [provider]  Calcium Carb-Cholecalciferol (CALCIUM 600 + D) 600-200 MG-UNIT TABS Take 1 tablet by mouth daily.    [provider]  carbamazepine (TEGRETOL XR) 200 MG 12 hr tablet Take 200 mg at bedtime by mouth. 10/23/17   [provider]  conjugated estrogens (PREMARIN) vaginal cream Place 1.5 g daily vaginally. 08/27/17   [provider]  divalproex (DEPAKOTE ER) 500 MG 24 hr tablet TAKE 1 TABLET BY MOUTH EVERY MORNING AND TAKE 2 TABLETS BY MOUTH EVERY EVENING Patient taking differently: TAKE 500 mg TABLET BY MOUTH EVERY MORNING AND TAKE 1000 mg  TABLETS BY MOUTH  EVERY EVENING 10/11/16   Marcial Pacas, MD  esomeprazole (NEXIUM) 40 MG capsule Take 40 mg by mouth daily after breakfast.  07/13/15   [provider]  ferrous sulfate 325 (65 FE) MG tablet Take 325 mg by mouth daily with breakfast.    [provider]  Multiple Vitamin (MULTIVITAMIN WITH MINERALS) TABS Take 1 tablet by mouth daily.    [provider]  MYRBETRIQ 50 MG TB24 tablet Take 50 mg daily by mouth. 09/23/17   [provider]  predniSONE (DELTASONE) 10 MG tablet Take 2 tablets (20 mg total) by mouth daily. 11/12/17   Jola Schmidt, MD  topiramate (TOPAMAX) 100 MG tablet Take 100 mg by mouth daily.  07/08/14   [provider]  traMADol (ULTRAM) 50 MG tablet Take 1 tablet (50 mg total) by mouth every 6 (six) hours as needed. Patient taking differently: Take 50 mg every 6 (six) hours as needed by mouth for moderate pain.  05/15/17   Marcial Pacas, MD  vitamin B-12 (CYANOCOBALAMIN) 100 MCG tablet Take 1,000 mcg daily by mouth.    [provider]    Family History Family History  Problem Relation Age of Onset  . Lung cancer Mother   . Kidney failure Father   . Hypertension Other     Social History Social History   Tobacco Use  . Smoking status: Never Smoker  . Smokeless tobacco: Never Used  Substance Use Topics  . Alcohol use: No    Alcohol/week: 0.0 standard drinks  . Drug use: No     Allergies   Nsaids; Aspirin; and Tolmetin   Review of Systems Review of Systems  Constitutional: Negative for appetite change.  Respiratory: Negative for shortness of breath.   Cardiovascular: Negative for chest pain.  Gastrointestinal: Negative for abdominal pain.  Genitourinary: Negative for dysuria.  Musculoskeletal:       Left wrist pain  Skin: Negative for rash.  Neurological: Negative for weakness.  Psychiatric/Behavioral: Negative for confusion.     Physical Exam Updated Vital Signs BP 134/70 (BP Location: Right Arm)   Pulse 72    Temp 98 F (36.7 C)   Resp 16   SpO2 100%   Physical Exam  Constitutional: She appears well-developed.  HENT:  Mild hematoma left superior orbital area.  Eye movements intact.  No underlying bony tenderness.  Eyes: Pupils are equal, round, and reactive to light.  Neck: Neck supple.  Cardiovascular: Normal rate.  Pulmonary/Chest: Effort normal.  Abdominal: There is no tenderness.  Musculoskeletal: She exhibits tenderness. She exhibits no edema.  No tenderness over shoulders or elbows.  No tenderness to right wrist.  There is tenderness to left wrist.  Good range of motion.  Skin intact.  Tenderness to left hip laterally.  Good range of motion.  Tenderness to right knee anteriorly.  Good range of motion.  No spine tenderness.  No chest or abdominal tenderness.  Neurological: She is alert.  Skin: Skin is warm. Capillary refill takes less than 2 seconds.  Psychiatric: She has a normal mood and affect.     ED Treatments / Results  Labs (all labs ordered are listed, but only abnormal results are displayed) Labs Reviewed - No data to display  EKG None  Radiology Dg Wrist Complete Left  Result Date: 10/10/2018 CLINICAL DATA:  Fall.  Landed on left wrist. EXAM: LEFT WRIST - COMPLETE 3+ VIEW COMPARISON:  No recent. FINDINGS: Diffuse osteopenia and degenerative change. No acute bony abnormality identified. No evidence of fracture or dislocation. IMPRESSION: Diffuse osteopenia degenerative change.  No acute abnormality. Electronically Signed   By: Marcello Moores  Register   On: 10/10/2018 17:10   Dg Knee Complete 4 Views Right  Result Date: 10/10/2018 CLINICAL DATA:  Right knee pain after fall today. EXAM: RIGHT KNEE - COMPLETE 4+ VIEW COMPARISON:  None. FINDINGS: No evidence of fracture, dislocation, or joint effusion. No evidence of arthropathy or other focal bone abnormality. Soft tissues are unremarkable. IMPRESSION: Negative. Electronically Signed   By: Marijo Conception, M.D.   On:  10/10/2018 17:12   Dg Hip Unilat W Or Wo Pelvis 2-3 Views Left  Result Date: 10/10/2018 CLINICAL DATA:  Left hip pain after fall today. EXAM: DG HIP (WITH OR WITHOUT PELVIS) 2-3V LEFT COMPARISON:  None. FINDINGS: There is no evidence of hip fracture or dislocation. There is no evidence of arthropathy or other focal bone abnormality. IMPRESSION: Negative. Electronically Signed   By: Marijo Conception, M.D.   On: 10/10/2018 17:11    Procedures Procedures (including critical care time)  Medications Ordered in ED Medications - No data to display   Initial Impression / Assessment and Plan / ED Course  I have reviewed the triage vital signs and the nursing notes.  Pertinent labs & imaging results that were available during my care of the patient were reviewed by me and considered in my medical decision making (see chart for details).     Patient with fall.  Mild hematoma on face.  Does not appear to need imaging there however.  Peripheral imaging reassuring but is tenderness over snuffbox and left wrist.  Splinted and will follow-up with hand surgery.  Final Clinical Impressions(s) / ED Diagnoses   Final diagnoses:  Fall, initial encounter  Contusion of left hip, initial encounter  Wrist sprain, left, initial encounter  Contusion of face, initial encounter    ED Discharge Orders    None       Davonna Belling, MD 10/11/18 [phone]

## 2018-10-10 NOTE — Discharge Instructions (Addendum)
Follow-up with the hand surgeon.  Because you are tender over the snuffbox you were given a splint.  You may end up getting a black eye from the bruising on your face.

## 2018-10-10 NOTE — ED Notes (Signed)
Patient transported to X-ray 

## 2018-10-10 NOTE — ED Notes (Signed)
Pt ambulatory to the restroom.  

## 2018-10-16 DIAGNOSIS — N3944 Nocturnal enuresis: Secondary | ICD-10-CM | POA: Diagnosis not present

## 2018-10-16 DIAGNOSIS — N3941 Urge incontinence: Secondary | ICD-10-CM | POA: Diagnosis not present

## 2018-10-20 DIAGNOSIS — G43709 Chronic migraine without aura, not intractable, without status migrainosus: Secondary | ICD-10-CM | POA: Diagnosis not present

## 2018-10-21 ENCOUNTER — Telehealth: Payer: Self-pay | Admitting: Gastroenterology

## 2018-10-21 ENCOUNTER — Other Ambulatory Visit: Payer: Self-pay | Admitting: Surgery

## 2018-10-21 DIAGNOSIS — R351 Nocturia: Secondary | ICD-10-CM | POA: Diagnosis not present

## 2018-10-21 DIAGNOSIS — K432 Incisional hernia without obstruction or gangrene: Secondary | ICD-10-CM | POA: Diagnosis not present

## 2018-10-21 DIAGNOSIS — N3941 Urge incontinence: Secondary | ICD-10-CM | POA: Diagnosis not present

## 2018-10-21 NOTE — Telephone Encounter (Signed)
Left a message on patients voicemail to call back regarding reviewed records.

## 2018-10-21 NOTE — Telephone Encounter (Signed)
   Colonoscopy July 2015, Dr. Watt Climes.  Indications "abnormal abdominal x-ray of the GI tract, change in bowel habits, fecal incontinence.  Findings diverticulosis in the sigmoid colon.  Normal terminal ileum.  Normal colonic mucosa otherwise that was biopsied.  Pathology reports the random colon biopsies were essentially normal.  EGD January 2018, Dr. Watt Climes, indications "for therapy of esophageal reflux, nausea and vomiting".  Findings normal larynx, small hiatal hernia, chronic gastritis biopsied, normal duodenum.  Biopsies of the stomach showed no H. pylori.  She was recommended by the doctor to use Nexium twice daily indefinitely, return to the clinic as needed.     kelly Please call her to let her know I reviewed records from her previous gastroenterologist.  No changes in my office recommendations and please put her in for recall colonoscopy July 2025 for routine risk screening.

## 2018-10-21 NOTE — Telephone Encounter (Signed)
Spoke to patient,informed her that  Dr Ardis Hughs reviewed her 54 colonoscopy,and 2018 EGD records form Dr Watt Climes. No changes in his office recommendations. A recall colonoscopy was placed for July 2025. Patient voiced understanding.

## 2018-10-24 DIAGNOSIS — D509 Iron deficiency anemia, unspecified: Secondary | ICD-10-CM | POA: Diagnosis not present

## 2018-10-28 NOTE — Pre-Procedure Instructions (Signed)
[name]  10/28/2018       Your procedure is scheduled on November 05, 2012.  Report to [facility] Admitting at 08:00 A.M.  Call this number if you have problems the morning of surgery:  [phone]   Remember:  Do not eat or drink after midnight.     Take these medicines the morning of surgery with A SIP OF WATER : Acetaminophen (Tylenol) if needed Amlodipine (Norvasc) Divalproex (Depakote ER) Methocarbamol (Robaxin) Myrbetriq Topiramte (topamax)  7 days prior to surgery STOP taking any Aspirin(unless otherwise instructed by your surgeon), Aleve, Naproxen, Ibuprofen, Motrin, Advil, Goody's, BC's, all herbal medications, fish oil, and all vitamins      How to Manage Your Diabetes Before and After Surgery  Why is it important to control my blood sugar before and after surgery? . Improving blood sugar levels before and after surgery helps healing and can limit problems. . A way of improving blood sugar control is eating a healthy diet by: o  Eating less sugar and carbohydrates o  Increasing activity/exercise o  Talking with your doctor about reaching your blood sugar goals . High blood sugars (greater than 180 mg/dL) can raise your risk of infections and slow your recovery, so you will need to focus on controlling your diabetes during the weeks before surgery. . Make sure that the doctor who takes care of your diabetes knows about your planned surgery including the date and location.  How do I manage my blood sugar before surgery? . Check your blood sugar at least 4 times a day, starting 2 days before surgery, to make sure that the level is not too high or low. o Check your blood sugar the morning of your surgery when you wake up and every 2 hours until you get to the Short Stay unit. . If your blood sugar is less than 70 mg/dL, you will need to treat for low blood sugar: o Do not take insulin. o Treat a low blood sugar (less than 70 mg/dL) with  cup  of clear juice (cranberry or apple), 4 glucose tablets, OR glucose gel. Recheck blood sugar in 15 minutes after treatment (to make sure it is greater than 70 mg/dL). If your blood sugar is not greater than 70 mg/dL on recheck, call [phone] o  for further instructions. . Report your blood sugar to the short stay nurse when you get to Short Stay.  . If you are admitted to the hospital after surgery: o Your blood sugar will be checked by the staff and you will probably be given insulin after surgery (instead of oral diabetes medicines) to make sure you have good blood sugar levels. o The goal for blood sugar control after surgery is 80-180 mg/dL.      WHAT DO I DO ABOUT MY DIABETES MEDICATION?   Marland Kitchen Do not take oral diabetes medicines (pills) the morning of surgery. DO NOT TAKE METFORMIN THE DAY OF SURGERY.     Do not wear jewelry, make-up or nail polish.  Do not wear lotions, powders, or perfumes, or deodorant.  Do not shave 48 hours prior to surgery.    Do not bring valuables to the hospital.   [facility] is not responsible for any belongings or valuables.  Contacts, dentures or bridgework may not be worn into surgery.  Leave your suitcase in the car.  After surgery it may be brought to your room.  For patients admitted to the hospital, discharge time  will be determined by your treatment team.  Patients discharged the day of surgery will not be allowed to drive home.   Special instructions:   [facility]- Preparing For Surgery  Before surgery, you can play an important role. Because skin is not sterile, your skin needs to be as free of germs as possible. You can reduce the number of germs on your skin by washing with CHG (chlorahexidine gluconate) Soap before surgery.  CHG is an antiseptic cleaner which kills germs and bonds with the skin to continue killing germs even after washing.    Oral Hygiene is also important to reduce your risk of infection.  Remember - BRUSH YOUR  TEETH THE MORNING OF SURGERY WITH YOUR REGULAR TOOTHPASTE  Please do not use if you have an allergy to CHG or antibacterial soaps. If your skin becomes reddened/irritated stop using the CHG.  Do not shave (including legs and underarms) for at least 48 hours prior to first CHG shower. It is OK to shave your face.  Please follow these instructions carefully.   1. Shower the NIGHT BEFORE SURGERY and the MORNING OF SURGERY with CHG.   2. If you chose to wash your hair, wash your hair first as usual with your normal shampoo.  3. After you shampoo, rinse your hair and body thoroughly to remove the shampoo.  4. Use CHG as you would any other liquid soap. You can apply CHG directly to the skin and wash gently with a scrungie or a clean washcloth.   5. Apply the CHG Soap to your body ONLY FROM THE NECK DOWN.  Do not use on open wounds or open sores. Avoid contact with your eyes, ears, mouth and genitals (private parts). Wash Face and genitals (private parts)  with your normal soap.  6. Wash thoroughly, paying special attention to the area where your surgery will be performed.  7. Thoroughly rinse your body with warm water from the neck down.  8. DO NOT shower/wash with your normal soap after using and rinsing off the CHG Soap.  9. Pat yourself dry with a CLEAN TOWEL.  10. Wear CLEAN PAJAMAS to bed the night before surgery, wear comfortable clothes the morning of surgery  11. Place CLEAN SHEETS on your bed the night of your first shower and DO NOT SLEEP WITH PETS.    Day of Surgery:  Do not apply any deodorants/lotions.  Please wear clean clothes to the hospital/surgery center.   Remember to brush your teeth WITH YOUR REGULAR TOOTHPASTE.    Please read over the following fact sheets that you were given.

## 2018-10-29 ENCOUNTER — Encounter (HOSPITAL_COMMUNITY)
Admission: RE | Admit: 2018-10-29 | Discharge: 2018-10-29 | Disposition: A | Discharge status: Home / Self Care | Payer: Medicare Other | Source: Ambulatory Visit | Attending: Surgery | Admitting: Surgery

## 2018-10-29 ENCOUNTER — Encounter (HOSPITAL_COMMUNITY): Payer: Self-pay

## 2018-10-29 DIAGNOSIS — Z01818 Encounter for other preprocedural examination: Secondary | ICD-10-CM | POA: Diagnosis not present

## 2018-10-29 HISTORY — DX: Pneumonia, unspecified organism: J18.9

## 2018-10-29 LAB — BASIC METABOLIC PANEL
Anion gap: 10 (ref 5–15)
BUN: 26 mg/dL — ABNORMAL HIGH (ref 8–23)
CO2: 23 mmol/L (ref 22–32)
Calcium: 9.6 mg/dL (ref 8.9–10.3)
Chloride: 111 mmol/L (ref 98–111)
Creatinine, Ser: 1.08 mg/dL — ABNORMAL HIGH (ref 0.44–1.00)
GFR calc Af Amer: 60 mL/min — ABNORMAL LOW (ref >60)
GFR calc non Af Amer: 52 mL/min — ABNORMAL LOW (ref >60)
Glucose, Bld: 111 mg/dL — ABNORMAL HIGH (ref 70–99)
Potassium: 3.8 mmol/L (ref 3.5–5.1)
Sodium: 144 mmol/L (ref 135–145)

## 2018-10-29 LAB — CBC
HCT: 37.4 % (ref 36.0–46.0)
Hemoglobin: 10.9 g/dL — ABNORMAL LOW (ref 12.0–15.0)
MCH: 28.5 pg (ref 26.0–34.0)
MCHC: 29.1 g/dL — ABNORMAL LOW (ref 30.0–36.0)
MCV: 97.7 fL (ref 80.0–100.0)
Platelets: 314 10*3/uL (ref 150–400)
RBC: 3.83 MIL/uL — ABNORMAL LOW (ref 3.87–5.11)
RDW: 14.8 % (ref 11.5–15.5)
WBC: 7.3 10*3/uL (ref 4.0–10.5)
nRBC: 0 % (ref 0.0–0.2)

## 2018-10-29 LAB — GLUCOSE, CAPILLARY: Glucose-Capillary: 113 mg/dL — ABNORMAL HIGH (ref 70–99)

## 2018-10-29 LAB — HEMOGLOBIN A1C
Hgb A1c MFr Bld: 5.3 % (ref 4.8–5.6)
Mean Plasma Glucose: 105.41 mg/dL

## 2018-10-29 NOTE — Progress Notes (Signed)
PCP: Dr. Jeremy Johann @ [facility]

## 2018-11-04 NOTE — H&P (Signed)
  [name] Documented: 10/21/2018 11:29 AM Location: [facility] Patient #: [id number] DOB: [birth year] Married / Language: English / Race: Black or African American Female   History of Present Illness ([name] A. [name] MD; 10/21/2018 11:46 AM) The patient is a 67 year old female who presents for an evaluation of a hernia. This patient is well-known to me status post both laparoscopic and open incisional hernia repairs. Initially, she had a hernia around the umbilicus as well as one at the pelvic brim. I repaired this laparoscopically in June 2007. She developed a recurrence down in the pelvis later that year and I performed an open repair of that hernia with mesh. She has done well until recently when any other hernia developed in the left lower quadrant. She has been having intermittent left lower quadrant abdominal pain. She underwent a CT scan showing constipation and a left inguinal hernia containing colon. It appears close to the midline so suspected is partly incisional. She reports some urinary frequency and was scanned the colon is kind of slightly pushing the bladder. She is otherwise healthy and has had no right upper quadrant abdominal pain. Her CT scan did show gallstones.   Allergies Malachi Bonds, CMA; 10/21/2018 11:30 AM) Aspirin *ANALGESICS - NonNarcotic*   Medication History (Chemira Jones, CMA; 10/21/2018 11:31 AM) Aimovig (140MG /ML Soln Auto-inj, Subcutaneous) Active. metFORMIN HCl ER (500MG  Tablet ER 24HR, Oral) Active. Methocarbamol (750MG  Tablet, Oral) Active. Myrbetriq (50MG  Tablet ER 24HR, Oral) Active. Mirtazapine (15MG  Tablet, Oral) Active. Esomeprazole Magnesium (40MG  Capsule DR, Oral) Active. SUMAtriptan-Naproxen Sodium (85-500MG  Tablet, Oral) Active. AmLODIPine Besylate (5MG  Tablet, Oral) Active. Topiramate (100MG  Tablet, Oral) Active. Iron (Ferrous Gluconate) (325MG  Tablet, Oral) Active. Divalproex Sodium ER (500MG   Tablet ER 24HR, Oral two times daily) Active. Atorvastatin Calcium (40MG  Tablet, Oral) Active. Medications Reconciled  Vitals (Chemira Jones CMA; 10/21/2018 11:30 AM) 10/21/2018 11:29 AM Weight: 119 lb Height: 60in Body Surface Area: 1.5 m Body Mass Index: 23.24 kg/m  Temp.: 98.68F(Oral)  Pulse: 85 (Regular)  BP: 110/60 (Sitting, Left Arm, Standard)       Physical Exam (Milley Vining A. [name] MD; 10/21/2018 11:47 AM) The physical exam findings are as follows: Note:On exam she appears at her baseline. Lungs are clear bilaterally Cardiovascular is regular rate and rhythm Her abdomen is soft. There is an easily reducible left inguinal hernia which is nontender. I can feel the fascial defect which is caught at the midline. There are no other fascial defects.    Assessment & Plan (Jonisha Kindig A. [name] MD; 10/21/2018 11:48 AM) RECURRENT INCISIONAL HERNIA (K43.2) Impression: I have reviewed her CT scan and discussed the diagnosis of the patient and her husband. As she has been having discomfort from the left inguinal hernia and she has constipation as well as the fact that there is colon in the hernia, repair is recommended. Again, I suspect this is part incisional and part inguinal given the location and the fascial defect. I discussed the surgical procedure with him in detail. We discussed the risks which includes but is not limited to bleeding, infection, injury to surrounding structures, use of mesh, hernia recurrence, a chance she still may have constipation and bladder issues, postoperative recovery, etc. She understands and wished to proceed with surgery

## 2018-11-05 ENCOUNTER — Encounter (HOSPITAL_COMMUNITY): Payer: Self-pay | Admitting: Urology

## 2018-11-05 ENCOUNTER — Ambulatory Visit (HOSPITAL_COMMUNITY): Payer: Medicare Other | Admitting: Certified Registered Nurse Anesthetist

## 2018-11-05 ENCOUNTER — Other Ambulatory Visit: Payer: Self-pay

## 2018-11-05 ENCOUNTER — Observation Stay (HOSPITAL_COMMUNITY)
Admission: RE | Admit: 2018-11-05 | Discharge: 2018-11-06 | Disposition: A | Discharge status: Home / Self Care | Payer: Medicare Other | Source: Ambulatory Visit | Attending: Surgery | Admitting: Surgery

## 2018-11-05 ENCOUNTER — Encounter (HOSPITAL_COMMUNITY)
Admission: RE | Disposition: A | Discharge status: Home / Self Care | Payer: Self-pay | Source: Ambulatory Visit | Attending: Surgery

## 2018-11-05 DIAGNOSIS — Z886 Allergy status to analgesic agent status: Secondary | ICD-10-CM | POA: Diagnosis not present

## 2018-11-05 DIAGNOSIS — R569 Unspecified convulsions: Secondary | ICD-10-CM | POA: Insufficient documentation

## 2018-11-05 DIAGNOSIS — F329 Major depressive disorder, single episode, unspecified: Secondary | ICD-10-CM | POA: Diagnosis not present

## 2018-11-05 DIAGNOSIS — K5900 Constipation, unspecified: Secondary | ICD-10-CM | POA: Insufficient documentation

## 2018-11-05 DIAGNOSIS — R51 Headache: Secondary | ICD-10-CM | POA: Diagnosis not present

## 2018-11-05 DIAGNOSIS — K432 Incisional hernia without obstruction or gangrene: Secondary | ICD-10-CM | POA: Diagnosis not present

## 2018-11-05 DIAGNOSIS — I10 Essential (primary) hypertension: Secondary | ICD-10-CM | POA: Insufficient documentation

## 2018-11-05 DIAGNOSIS — D649 Anemia, unspecified: Secondary | ICD-10-CM | POA: Diagnosis not present

## 2018-11-05 DIAGNOSIS — F0390 Unspecified dementia without behavioral disturbance: Secondary | ICD-10-CM | POA: Insufficient documentation

## 2018-11-05 DIAGNOSIS — Z791 Long term (current) use of non-steroidal anti-inflammatories (NSAID): Secondary | ICD-10-CM | POA: Insufficient documentation

## 2018-11-05 DIAGNOSIS — Z79899 Other long term (current) drug therapy: Secondary | ICD-10-CM | POA: Insufficient documentation

## 2018-11-05 DIAGNOSIS — G8918 Other acute postprocedural pain: Secondary | ICD-10-CM | POA: Diagnosis not present

## 2018-11-05 DIAGNOSIS — Z7984 Long term (current) use of oral hypoglycemic drugs: Secondary | ICD-10-CM | POA: Diagnosis not present

## 2018-11-05 HISTORY — PX: INCISIONAL HERNIA REPAIR: SHX193

## 2018-11-05 HISTORY — PX: INGUINAL HERNIA REPAIR: SHX194

## 2018-11-05 LAB — GLUCOSE, CAPILLARY
Glucose-Capillary: 85 mg/dL (ref 70–99)
Glucose-Capillary: 82 mg/dL (ref 70–99)
Glucose-Capillary: 112 mg/dL — ABNORMAL HIGH (ref 70–99)
Glucose-Capillary: 187 mg/dL — ABNORMAL HIGH (ref 70–99)

## 2018-11-05 SURGERY — REPAIR, HERNIA, INGUINAL, ADULT
Anesthesia: General | Site: Groin | Laterality: Left

## 2018-11-05 MED ORDER — ONDANSETRON HCL 4 MG/2ML IJ SOLN: 4.0000 mg | Freq: Four times a day (QID) | INTRAMUSCULAR | Status: DC | PRN

## 2018-11-05 MED ORDER — SUCCINYLCHOLINE CHLORIDE 200 MG/10ML IV SOSY
PREFILLED_SYRINGE | INTRAVENOUS | Status: AC
  Filled 2018-11-05: qty 10

## 2018-11-05 MED ORDER — BUPIVACAINE-EPINEPHRINE (PF) 0.25% -1:200000 IJ SOLN
INTRAMUSCULAR | Status: DC | PRN
  Administered 2018-11-05: 10 mL

## 2018-11-05 MED ORDER — ONDANSETRON 4 MG PO TBDP: 4.0000 mg | ORAL_TABLET | Freq: Four times a day (QID) | ORAL | Status: DC | PRN

## 2018-11-05 MED ORDER — OXYCODONE HCL 5 MG/5ML PO SOLN: 5.0000 mg | Freq: Once | ORAL | Status: DC | PRN

## 2018-11-05 MED ORDER — AMLODIPINE BESYLATE 5 MG PO TABS: 5.0000 mg | ORAL_TABLET | Freq: Every day | ORAL | Status: DC

## 2018-11-05 MED ORDER — INSULIN ASPART 100 UNIT/ML ~~LOC~~ SOLN: 0.0000 [IU] | Freq: Three times a day (TID) | SUBCUTANEOUS | Status: DC

## 2018-11-05 MED ORDER — MIRABEGRON ER 25 MG PO TB24
50.0000 mg | ORAL_TABLET | Freq: Every day | ORAL | Status: DC
  Administered 2018-11-05: 50 mg via ORAL
  Filled 2018-11-05: qty 2

## 2018-11-05 MED ORDER — ENOXAPARIN SODIUM 40 MG/0.4ML ~~LOC~~ SOLN
40.0000 mg | SUBCUTANEOUS | Status: DC
  Administered 2018-11-06: 40 mg via SUBCUTANEOUS
  Filled 2018-11-05: qty 0.4

## 2018-11-05 MED ORDER — ROCURONIUM BROMIDE 10 MG/ML (PF) SYRINGE
PREFILLED_SYRINGE | INTRAVENOUS | Status: DC | PRN
  Administered 2018-11-05: 40 mg via INTRAVENOUS

## 2018-11-05 MED ORDER — MIRTAZAPINE 15 MG PO TABS: 15.0000 mg | ORAL_TABLET | Freq: Every evening | ORAL | Status: DC | PRN

## 2018-11-05 MED ORDER — 0.9 % SODIUM CHLORIDE (POUR BTL) OPTIME
TOPICAL | Status: DC | PRN
  Administered 2018-11-05: 1000 mL

## 2018-11-05 MED ORDER — MORPHINE SULFATE (PF) 2 MG/ML IV SOLN: 1.0000 mg | INTRAVENOUS | Status: DC | PRN

## 2018-11-05 MED ORDER — GABAPENTIN 300 MG PO CAPS
300.0000 mg | ORAL_CAPSULE | ORAL | Status: AC
  Administered 2018-11-05: 300 mg via ORAL
  Filled 2018-11-05: qty 1

## 2018-11-05 MED ORDER — FENTANYL CITRATE (PF) 100 MCG/2ML IJ SOLN
50.0000 ug | Freq: Once | INTRAMUSCULAR | Status: AC
  Administered 2018-11-05: 50 ug via INTRAVENOUS

## 2018-11-05 MED ORDER — DEXAMETHASONE SODIUM PHOSPHATE 10 MG/ML IJ SOLN
INTRAMUSCULAR | Status: AC
  Filled 2018-11-05: qty 2

## 2018-11-05 MED ORDER — CEFAZOLIN SODIUM-DEXTROSE 2-4 GM/100ML-% IV SOLN
2.0000 g | INTRAVENOUS | Status: AC
  Administered 2018-11-05: 2 g via INTRAVENOUS
  Filled 2018-11-05: qty 100

## 2018-11-05 MED ORDER — CHLORHEXIDINE GLUCONATE CLOTH 2 % EX PADS: 6.0000 | MEDICATED_PAD | Freq: Once | CUTANEOUS | Status: DC

## 2018-11-05 MED ORDER — LACTATED RINGERS IV SOLN
INTRAVENOUS | Status: DC
  Administered 2018-11-05: 08:00:00 via INTRAVENOUS

## 2018-11-05 MED ORDER — MIDAZOLAM HCL 2 MG/2ML IJ SOLN
INTRAMUSCULAR | Status: AC
  Filled 2018-11-05: qty 2

## 2018-11-05 MED ORDER — PROPOFOL 10 MG/ML IV BOLUS
INTRAVENOUS | Status: DC | PRN
  Administered 2018-11-05: 110 mg via INTRAVENOUS

## 2018-11-05 MED ORDER — PROPOFOL 10 MG/ML IV BOLUS
INTRAVENOUS | Status: AC
  Filled 2018-11-05: qty 20

## 2018-11-05 MED ORDER — ROPIVACAINE HCL 7.5 MG/ML IJ SOLN
INTRAMUSCULAR | Status: DC | PRN
  Administered 2018-11-05: 20 mL via PERINEURAL

## 2018-11-05 MED ORDER — OXYCODONE HCL 5 MG PO TABS: 5.0000 mg | ORAL_TABLET | Freq: Once | ORAL | Status: DC | PRN

## 2018-11-05 MED ORDER — FENTANYL CITRATE (PF) 100 MCG/2ML IJ SOLN
INTRAMUSCULAR | Status: DC | PRN
  Administered 2018-11-05: 50 ug via INTRAVENOUS

## 2018-11-05 MED ORDER — PHENYLEPHRINE 40 MCG/ML (10ML) SYRINGE FOR IV PUSH (FOR BLOOD PRESSURE SUPPORT)
PREFILLED_SYRINGE | INTRAVENOUS | Status: DC | PRN
  Administered 2018-11-05 (×2): 120 ug via INTRAVENOUS

## 2018-11-05 MED ORDER — SODIUM CHLORIDE 0.9 % IV SOLN
INTRAVENOUS | Status: DC | PRN
  Administered 2018-11-05: 40 ug/min via INTRAVENOUS

## 2018-11-05 MED ORDER — METHOCARBAMOL 750 MG PO TABS: 750.0000 mg | ORAL_TABLET | Freq: Two times a day (BID) | ORAL | Status: DC | PRN

## 2018-11-05 MED ORDER — TOPIRAMATE 100 MG PO TABS
100.0000 mg | ORAL_TABLET | Freq: Every day | ORAL | Status: DC
  Administered 2018-11-05: 100 mg via ORAL
  Filled 2018-11-05: qty 1

## 2018-11-05 MED ORDER — LIDOCAINE HCL (CARDIAC) PF 100 MG/5ML IV SOSY
PREFILLED_SYRINGE | INTRAVENOUS | Status: DC | PRN
  Administered 2018-11-05: 60 mg via INTRAVENOUS

## 2018-11-05 MED ORDER — FENTANYL CITRATE (PF) 250 MCG/5ML IJ SOLN
INTRAMUSCULAR | Status: AC
  Filled 2018-11-05: qty 5

## 2018-11-05 MED ORDER — GABAPENTIN 300 MG PO CAPS
300.0000 mg | ORAL_CAPSULE | Freq: Two times a day (BID) | ORAL | Status: DC
  Administered 2018-11-05 (×2): 300 mg via ORAL
  Filled 2018-11-05 (×2): qty 1

## 2018-11-05 MED ORDER — DEXAMETHASONE SODIUM PHOSPHATE 10 MG/ML IJ SOLN
INTRAMUSCULAR | Status: DC | PRN
  Administered 2018-11-05: 4 mg via INTRAVENOUS

## 2018-11-05 MED ORDER — LIDOCAINE 2% (20 MG/ML) 5 ML SYRINGE
INTRAMUSCULAR | Status: AC
  Filled 2018-11-05: qty 10

## 2018-11-05 MED ORDER — OXYCODONE HCL 5 MG PO TABS: 5.0000 mg | ORAL_TABLET | ORAL | Status: DC | PRN

## 2018-11-05 MED ORDER — FENTANYL CITRATE (PF) 100 MCG/2ML IJ SOLN
INTRAMUSCULAR | Status: AC
  Administered 2018-11-05: 50 ug via INTRAVENOUS
  Filled 2018-11-05: qty 2

## 2018-11-05 MED ORDER — ONDANSETRON HCL 4 MG/2ML IJ SOLN
INTRAMUSCULAR | Status: DC | PRN
  Administered 2018-11-05: 4 mg via INTRAVENOUS

## 2018-11-05 MED ORDER — SUGAMMADEX SODIUM 200 MG/2ML IV SOLN
INTRAVENOUS | Status: DC | PRN
  Administered 2018-11-05: 200 mg via INTRAVENOUS

## 2018-11-05 MED ORDER — ACETAMINOPHEN 500 MG PO TABS
1000.0000 mg | ORAL_TABLET | ORAL | Status: DC
  Filled 2018-11-05: qty 2

## 2018-11-05 MED ORDER — DIVALPROEX SODIUM ER 500 MG PO TB24
500.0000 mg | ORAL_TABLET | Freq: Two times a day (BID) | ORAL | Status: DC
  Administered 2018-11-05: 500 mg via ORAL
  Filled 2018-11-05 (×2): qty 1

## 2018-11-05 MED ORDER — EPHEDRINE 5 MG/ML INJ
INTRAVENOUS | Status: AC
  Filled 2018-11-05: qty 10

## 2018-11-05 MED ORDER — TRAMADOL HCL 50 MG PO TABS: 50.0000 mg | ORAL_TABLET | Freq: Four times a day (QID) | ORAL | Status: DC | PRN

## 2018-11-05 MED ORDER — ONDANSETRON HCL 4 MG/2ML IJ SOLN
INTRAMUSCULAR | Status: AC
  Filled 2018-11-05: qty 2

## 2018-11-05 MED ORDER — FENTANYL CITRATE (PF) 100 MCG/2ML IJ SOLN: 25.0000 ug | INTRAMUSCULAR | Status: DC | PRN

## 2018-11-05 MED ORDER — POTASSIUM CHLORIDE IN NACL 20-0.9 MEQ/L-% IV SOLN
INTRAVENOUS | Status: DC
  Administered 2018-11-05: 14:00:00 via INTRAVENOUS
  Filled 2018-11-05: qty 1000

## 2018-11-05 SURGICAL SUPPLY — 41 items
ADH SKN CLS APL DERMABOND .7 (GAUZE/BANDAGES/DRESSINGS) ×1
BLADE CLIPPER SURG (BLADE) IMPLANT
BLADE SURG 10 STRL SS (BLADE) ×2 IMPLANT
BLADE SURG 15 STRL LF DISP TIS (BLADE) ×1 IMPLANT
BLADE SURG 15 STRL SS (BLADE) ×2
CHLORAPREP W/TINT 26ML (MISCELLANEOUS) ×2 IMPLANT
COVER SURGICAL LIGHT HANDLE (MISCELLANEOUS) ×2 IMPLANT
COVER WAND RF STERILE (DRAPES) ×2 IMPLANT
DERMABOND ADVANCED (GAUZE/BANDAGES/DRESSINGS) ×1
DERMABOND ADVANCED .7 DNX12 (GAUZE/BANDAGES/DRESSINGS) ×1 IMPLANT
DRAIN PENROSE 1/2X12 LTX STRL (WOUND CARE) IMPLANT
DRAPE LAPAROTOMY TRNSV 102X78 (DRAPE) ×2 IMPLANT
DRAPE UTILITY XL STRL (DRAPES) ×2 IMPLANT
ELECT CAUTERY BLADE 6.4 (BLADE) ×2 IMPLANT
ELECT REM PT RETURN 9FT ADLT (ELECTROSURGICAL) ×2
ELECTRODE REM PT RTRN 9FT ADLT (ELECTROSURGICAL) ×1 IMPLANT
GLOVE SURG SIGNA 7.5 PF LTX (GLOVE) ×2 IMPLANT
GOWN STRL REUS W/ TWL LRG LVL3 (GOWN DISPOSABLE) ×1 IMPLANT
GOWN STRL REUS W/ TWL XL LVL3 (GOWN DISPOSABLE) ×1 IMPLANT
GOWN STRL REUS W/TWL LRG LVL3 (GOWN DISPOSABLE) ×2
GOWN STRL REUS W/TWL XL LVL3 (GOWN DISPOSABLE) ×2
KIT BASIN OR (CUSTOM PROCEDURE TRAY) ×2 IMPLANT
KIT TURNOVER KIT B (KITS) ×2 IMPLANT
MESH VENTRALEX ST 2.5 CRC MED (Mesh General) ×2 IMPLANT
NEEDLE HYPO 25GX1X1/2 BEV (NEEDLE) ×2 IMPLANT
NS IRRIG 1000ML POUR BTL (IV SOLUTION) ×2 IMPLANT
PACK SURGICAL SETUP 50X90 (CUSTOM PROCEDURE TRAY) ×2 IMPLANT
PAD ARMBOARD 7.5X6 YLW CONV (MISCELLANEOUS) ×2 IMPLANT
PENCIL BUTTON HOLSTER BLD 10FT (ELECTRODE) ×2 IMPLANT
SPONGE LAP 18X18 X RAY DECT (DISPOSABLE) ×2 IMPLANT
SUT MNCRL AB 4-0 PS2 18 (SUTURE) ×2 IMPLANT
SUT MON AB 4-0 PC3 18 (SUTURE) ×2 IMPLANT
SUT NOVA NAB DX-16 0-1 5-0 T12 (SUTURE) ×4 IMPLANT
SUT SILK 2 0 SH (SUTURE) IMPLANT
SUT VIC AB 2-0 CT1 27 (SUTURE) ×2
SUT VIC AB 2-0 CT1 TAPERPNT 27 (SUTURE) ×1 IMPLANT
SUT VIC AB 3-0 CT1 27 (SUTURE) ×2
SUT VIC AB 3-0 CT1 TAPERPNT 27 (SUTURE) ×1 IMPLANT
SYR CONTROL 10ML LL (SYRINGE) ×2 IMPLANT
TOWEL OR 17X24 6PK STRL BLUE (TOWEL DISPOSABLE) ×2 IMPLANT
TOWEL OR 17X26 10 PK STRL BLUE (TOWEL DISPOSABLE) ×2 IMPLANT

## 2018-11-05 NOTE — Anesthesia Preprocedure Evaluation (Addendum)
Anesthesia Evaluation  Patient identified by MRN, date of birth, ID band Patient awake    Reviewed: Allergy & Precautions, H&P , NPO status , Patient's Chart, lab work & pertinent test results  Airway Mallampati: II  TM Distance: >3 FB Neck ROM: full    Dental  (+) Dental Advisory Given, Teeth Intact, Missing   Pulmonary neg pulmonary ROS,    breath sounds clear to auscultation       Cardiovascular hypertension, Pt. on medications  Rhythm:regular Rate:Normal     Neuro/Psych  Headaches, Seizures -, Well Controlled,  PSYCHIATRIC DISORDERS Depression Dementia    GI/Hepatic   Endo/Other  diabetes, Well Controlled, Type 2, Oral Hypoglycemic Agents  Renal/GU      Musculoskeletal   Abdominal   Peds  Hematology  (+) anemia ,   Anesthesia Other Findings   Reproductive/Obstetrics                            Anesthesia Physical Anesthesia Plan  ASA: II  Anesthesia Plan: General   Post-op Pain Management:  Regional for Post-op pain   Induction: Intravenous  PONV Risk Score and Plan: 3 and Ondansetron, Dexamethasone, Midazolam and Treatment may vary due to age or medical condition  Airway Management Planned: LMA  Additional Equipment:   Intra-op Plan:   Post-operative Plan:   Informed Consent: I have reviewed the patients History and Physical, chart, labs and discussed the procedure including the risks, benefits and alternatives for the proposed anesthesia with the patient or authorized representative who has indicated his/her understanding and acceptance.   Dental advisory given  Plan Discussed with: CRNA, Anesthesiologist and Surgeon  Anesthesia Plan Comments:        Anesthesia Quick Evaluation

## 2018-11-05 NOTE — Op Note (Signed)
OPEN REPAIR LEFT HERNIA ERAS PATHWAY  Procedure Note  [name] 11/05/2018   Pre-op Diagnosis: recurrent lower abdominal/left inguinal hernia     Post-op Diagnosis: incisional hernia  Procedure(s): OPEN INCISIONAL HERNIA REPAIR WITH MESH  Surgeon(s): Coralie Keens, MD  Anesthesia: General  Staff:  Circulator: Harrel Lemon, RN Scrub Person: Buddy Duty, CST  Estimated Blood Loss: Minimal               Findings: This was an incisional hernia.  The previous mesh had pulled away from the left lower quadrant of the pubis.  The hernia was repaired with Curtis 6.4 cm round ventral light Prolene ST patch from [facility]  Procedure: The patient was brought to the operating room and identified as correct patient.  She was placed supine on the operating table and general anesthesia was induced.  Her abdomen was then prepped and draped in usual sterile fashion.  I made Curtis longitudinal incision to the patient's previous scar in the left lower quadrant toward the midline.  I took this down through Scarpa's fascia with electrocautery.  I could feel the fascial defect as well as the previously placed midline mesh.  I entered Curtis hernia sac.  The mesh was attached to the fascia everywhere except at the pubis.  I was able to reduce the colon back into the abdominal cavity and freed up one small adhesion.  At this point I brought Curtis 6.4 cm round ventral patch onto the field.  I placed it through the fascial opening.  I then sewed the mesh to the pubis inferiorly and to the fascia laterally and superiorly with interrupted #1 Novafil sutures.  I then cut the ties from the mesh.  I then closed the fascia down to the pubis over the top of the mesh with figure-of-eight #1 Novafil sutures.  I then anesthetized the fascia and surrounding tissue with Marcaine.  I then closed the subcutaneous tissue with interrupted 3-0 Vicryl sutures and closed the skin with Curtis running 4-0 Monocryl.  Dermabond was then applied.   The patient tolerated the procedure well.  All the counts were correct at the end of the procedure.  The patient was then extubated in the operating room and taken in Curtis stable condition to the recovery room.          [name]   Date: 11/05/2018  Time: 10:46 AM

## 2018-11-05 NOTE — Interval H&P Note (Signed)
History and Physical Interval Note: no change in H and P  11/05/2018 9:23 AM  [name]  has presented today for surgery, with the diagnosis of recurrent lower abdominal/lefti inguinal hernia  The various methods of treatment have been discussed with the patient and family. After consideration of risks, benefits and other options for treatment, the patient has consented to  Procedure(s) with comments: [place] (Left) - TAP BLOCK as a surgical intervention .  The patient's history has been reviewed, patient examined, no change in status, stable for surgery.  I have reviewed the patient's chart and labs.  Questions were answered to the patient's satisfaction.     [name]

## 2018-11-05 NOTE — Anesthesia Procedure Notes (Signed)
Procedure Name: Intubation Date/Time: 11/05/2018 10:02 AM Performed by: Carney Living, CRNA Pre-anesthesia Checklist: Patient identified, Emergency Drugs available, Suction available, Patient being monitored and Timeout performed Patient Re-evaluated:Patient Re-evaluated prior to induction Oxygen Delivery Method: Circle system utilized Preoxygenation: Pre-oxygenation with 100% oxygen Induction Type: IV induction Ventilation: Mask ventilation without difficulty and Oral airway inserted - appropriate to patient size Laryngoscope Size: Mac and 4 Grade View: Grade II Tube type: Oral Tube size: 7.0 mm Number of attempts: 1 Airway Equipment and Method: Stylet Placement Confirmation: ETT inserted through vocal cords under direct vision,  positive ETCO2 and breath sounds checked- equal and bilateral Secured at: 20 cm Tube secured with: Tape Dental Injury: Teeth and Oropharynx as per pre-operative assessment

## 2018-11-05 NOTE — Transfer of Care (Signed)
Immediate Anesthesia Transfer of Care Note  Patient: [name]  Procedure(s) Performed: OPEN REPAIR LEFT HERNIA ERAS PATHWAY (Left Groin)  Patient Location: PACU  Anesthesia Type:General  Level of Consciousness: awake, alert  and patient cooperative  Airway & Oxygen Therapy: Patient Spontanous Breathing and Patient connected to nasal cannula oxygen  Post-op Assessment: Report given to RN, Post -op Vital signs reviewed and stable and Patient moving all extremities X 4  Post vital signs: Reviewed and stable  Last Vitals:  Vitals Value Taken Time  BP 142/79 11/05/2018 11:00 AM  Temp    Pulse 75 11/05/2018 10:59 AM  Resp 17 11/05/2018 10:59 AM  SpO2 100 % 11/05/2018 10:59 AM  Vitals shown include unvalidated device data.  Last Pain:  Vitals:   11/05/18 0825  TempSrc:   PainSc: 3          Complications: No apparent anesthesia complications

## 2018-11-05 NOTE — Anesthesia Procedure Notes (Addendum)
Anesthesia Regional Block: TAP block   Pre-Anesthetic Checklist: ,, timeout performed, Correct Patient, Correct Site, Correct Laterality, Correct Procedure, Correct Position, site marked, Risks and benefits discussed,  Surgical consent,  Pre-op evaluation,  At surgeon's request and post-op pain management  Laterality: Left  Prep: chloraprep       Needles:  Injection technique: Single-shot  Needle Type: Echogenic Needle     Needle Length: 9cm  Needle Gauge: 21     Additional Needles:   Narrative:  Start time: 11/05/2018 9:27 AM End time: 11/05/2018 9:32 AM Injection made incrementally with aspirations every 5 mL.  Performed by: Personally  Anesthesiologist: Albertha Ghee, MD  Additional Notes: Pt tolerated the procedure well.

## 2018-11-05 NOTE — Progress Notes (Signed)
Received pt from PACU. Patient alert and oriented x4. Incision clean, dry intact. No complaints of pain at this time. Patient ambulated to bathroom with stand by assist. Oriented to room/call bell. Will continue to monitor.

## 2018-11-06 ENCOUNTER — Encounter (HOSPITAL_COMMUNITY): Payer: Self-pay | Admitting: Surgery

## 2018-11-06 DIAGNOSIS — Z7984 Long term (current) use of oral hypoglycemic drugs: Secondary | ICD-10-CM | POA: Diagnosis not present

## 2018-11-06 DIAGNOSIS — K432 Incisional hernia without obstruction or gangrene: Secondary | ICD-10-CM | POA: Diagnosis not present

## 2018-11-06 DIAGNOSIS — Z79899 Other long term (current) drug therapy: Secondary | ICD-10-CM | POA: Diagnosis not present

## 2018-11-06 DIAGNOSIS — K5900 Constipation, unspecified: Secondary | ICD-10-CM | POA: Diagnosis not present

## 2018-11-06 DIAGNOSIS — Z791 Long term (current) use of non-steroidal anti-inflammatories (NSAID): Secondary | ICD-10-CM | POA: Diagnosis not present

## 2018-11-06 DIAGNOSIS — Z886 Allergy status to analgesic agent status: Secondary | ICD-10-CM | POA: Diagnosis not present

## 2018-11-06 LAB — GLUCOSE, CAPILLARY: Glucose-Capillary: 74 mg/dL (ref 70–99)

## 2018-11-06 MED ORDER — TRAMADOL HCL 50 MG PO TABS: 50.0000 mg | ORAL_TABLET | Freq: Four times a day (QID) | ORAL | 0 refills | Status: DC | PRN

## 2018-11-06 NOTE — Progress Notes (Signed)
Patient discharged to home. Verbalizes understanding of all discharge instructions including incision care, discharge medications, and follow up MD visits. Patient accompanied by spouse.  

## 2018-11-06 NOTE — Anesthesia Postprocedure Evaluation (Signed)
Anesthesia Post Note  Patient: [name]  Procedure(s) Performed: OPEN REPAIR LEFT HERNIA ERAS PATHWAY (Left Groin)     Patient location during evaluation: PACU Anesthesia Type: General Level of consciousness: awake and alert Pain management: pain level controlled Vital Signs Assessment: post-procedure vital signs reviewed and stable Respiratory status: spontaneous breathing, nonlabored ventilation, respiratory function stable and patient connected to nasal cannula oxygen Cardiovascular status: blood pressure returned to baseline and stable Postop Assessment: no apparent nausea or vomiting Anesthetic complications: no    Last Vitals:  Vitals:   11/06/18 0450 11/06/18 0520  BP: 134/65 134/61  Pulse: 77 78  Resp: 17 14  Temp: 36.9 C 36.7 C  SpO2: 97% 100%    Last Pain:  Vitals:   11/06/18 0818  TempSrc:   PainSc: 1    Pain Goal:                 [name]

## 2018-11-06 NOTE — Discharge Instructions (Signed)
[facility] _______[facility]  UMBILICAL OR INGUINAL HERNIA REPAIR: POST OP INSTRUCTIONS  Always review your discharge instruction sheet given to you by the facility where your surgery was performed. IF YOU HAVE DISABILITY OR FAMILY LEAVE FORMS, YOU MUST BRING THEM TO THE OFFICE FOR PROCESSING.   DO NOT GIVE THEM TO YOUR DOCTOR.  1. A  prescription for pain medication may be given to you upon discharge.  Take your pain medication as prescribed, if needed.  If narcotic pain medicine is not needed, then you may take acetaminophen (Tylenol) or ibuprofen (Advil) as needed. 2. Take your usually prescribed medications unless otherwise directed. If you need a refill on your pain medication, please contact your pharmacy.  They will contact our office to request authorization. Prescriptions will not be filled after 5 pm or on week-ends. 3. You should follow a light diet the first 24 hours after arrival home, such as soup and crackers, etc.  Be sure to include lots of fluids daily.  Resume your normal diet the day after surgery. 4.Most patients will experience some swelling and bruising around the umbilicus or in the groin and scrotum.  Ice packs and reclining will help.  Swelling and bruising can take several days to resolve.  6. It is common to experience some constipation if taking pain medication after surgery.  Increasing fluid intake and taking a stool softener (such as Colace) will usually help or prevent this problem from occurring.  A mild laxative (Milk of Magnesia or Miralax) should be taken according to package directions if there are no bowel movements after 48 hours. 7. Unless discharge instructions indicate otherwise, you may remove your bandages 24-48 hours after surgery, and you may shower at that time.  You may have steri-strips (small skin tapes) in place directly over the incision.  These strips should be left on the skin for 7-10 days.  If your surgeon used skin glue on the  incision, you may shower in 24 hours.  The glue will flake off over the next 2-3 weeks.  Any sutures or staples will be removed at the office during your follow-up visit. 8. ACTIVITIES:  You may resume regular (light) daily activities beginning the next day--such as daily self-care, walking, climbing stairs--gradually increasing activities as tolerated.  You may have sexual intercourse when it is comfortable.  Refrain from any heavy lifting or straining until approved by your doctor.  a.You may drive when you are no longer taking prescription pain medication, you can comfortably wear a seatbelt, and you can safely maneuver your car and apply brakes. b.RETURN TO WORK:   _____________________________________________  9.You should see your doctor in the office for a follow-up appointment approximately 2-3 weeks after your surgery.  Make sure that you call for this appointment within a day or two after you arrive home to insure a convenient appointment time. 10.OTHER INSTRUCTIONS: _OK TO SHOWER STARTING TODAY NO LIFTING MORE THAN 15 POUNDS FOR 6 WEEKS ICE PACK, TYLENOL, IBUPROFEN ALSO FOR PAIN________________________    _____________________________________  WHEN TO CALL YOUR DOCTOR: 1. Fever over 101.0 2. Inability to urinate 3. Nausea and/or vomiting 4. Extreme swelling or bruising 5. Continued bleeding from incision. 6. Increased pain, redness, or drainage from the incision  The clinic staff is available to answer your questions during regular business hours.  Please dont hesitate to call and ask to speak to one of the nurses for clinical concerns.  If you have a medical emergency, go to the nearest emergency  room or call 911.  A surgeon from [facility] is always on call at the hospital ° ° °[address], [address], [place], [place]  [address] ? ° [address] [address], [place], [place]   [address] °[phone] ? [phone] ? FAX [phone] °Web site:  [email or web address] °

## 2018-11-06 NOTE — Discharge Summary (Signed)
Physician Discharge Summary  Patient ID: [name] MRN: [id number] DOB/AGE: Feb 17, 1951 67 y.o.  Admit date: 11/05/2018 Discharge date: 11/06/2018  Admission Diagnoses:  Discharge Diagnoses:  Active Problems:   Incisional hernia   Discharged Condition: good  Hospital Course: UNEVENTFUL POST OP RECOVER.  DISCHARGED HOME POD #1  Consults: None  Significant Diagnostic Studies:   Treatments: surgery: open incisional hernia repair with mesh  Discharge Exam: Blood pressure 134/61, pulse 78, temperature 98 F (36.7 C), temperature source Oral, resp. rate 14, SpO2 100 %. General appearance: alert, cooperative and no distress Resp: clear to auscultation bilaterally Cardio: regular rate and rhythm, S1, S2 normal, no murmur, click, rub or gallop Incision/Wound:incision clean, abdomen soft  Disposition: Discharge disposition: 01-Home or Self Care        Allergies as of 11/06/2018      Reactions   Aspirin Nausea Only, Other (See Comments)   ulcers   Nsaids Other (See Comments)   Can cause ulcers   Tolmetin Other (See Comments)   Can cause ulcers      Medication List    TAKE these medications   acetaminophen 500 MG tablet Commonly known as:  TYLENOL Take 1,000 mg by mouth 2 (two) times daily as needed for mild pain.   AIMOVIG (140 MG DOSE) [name] Inject 140 mg into the skin every 30 (thirty) days.   amLODipine 5 MG tablet Commonly known as:  NORVASC Take 5 mg by mouth daily after breakfast.   atorvastatin 80 MG tablet Commonly known as:  LIPITOR Take 80 mg by mouth daily.   B-12 2500 MCG Tabs Take 2,500 mcg by mouth daily.   CALCIUM 600 + D 600-200 MG-UNIT Tabs Generic drug:  Calcium Carb-Cholecalciferol Take 1 tablet by mouth daily.   cholecalciferol 1000 units tablet Commonly known as:  VITAMIN D Take 1,000 Units by mouth daily.   conjugated estrogens vaginal cream Commonly known as:  PREMARIN Place 1 Applicatorful vaginally once a week.    divalproex 500 MG 24 hr tablet Commonly known as:  DEPAKOTE ER TAKE 1 TABLET BY MOUTH EVERY MORNING AND TAKE 2 TABLETS BY MOUTH EVERY EVENING What changed:  See the new instructions.   ferrous sulfate 325 (65 FE) MG tablet Take 325 mg by mouth daily with breakfast.   Magnesium 400 MG Tabs Take 400 mg by mouth daily.   metFORMIN 500 MG 24 hr tablet Commonly known as:  GLUCOPHAGE-XR Take 500 mg by mouth daily with breakfast.   methocarbamol 750 MG tablet Commonly known as:  ROBAXIN Take 750 mg by mouth 2 (two) times daily as needed for muscle spasms.   mirtazapine 15 MG tablet Commonly known as:  REMERON Take 15 mg by mouth at bedtime as needed (sleep).   multivitamin with minerals Tabs tablet Take 1 tablet by mouth daily.   MYRBETRIQ 50 MG Tb24 tablet Generic drug:  mirabegron ER Take 50 mg daily by mouth.   SUMAtriptan-naproxen 85-500 MG tablet Commonly known as:  TREXIMET Take 1 tablet by mouth 2 (two) times daily as needed for migraine. Max 2 tab per 24 hours, 1-2 days per week   topiramate 100 MG tablet Commonly known as:  TOPAMAX Take 100 mg by mouth daily.   traMADol 50 MG tablet Commonly known as:  ULTRAM Take 1 tablet (50 mg total) by mouth every 6 (six) hours as needed for moderate pain or severe pain.   ZOMIG 5 MG nasal solution Generic drug:  zolmitriptan Place 1 spray into alternate  nostrils daily as needed for migraine.      Follow-up Information    Coralie Keens, MD. Schedule an appointment as soon as possible for a visit in 3 week(s).   Specialty:  General Surgery Contact information: [address] [place] [address] [phone]           Signed: Harl Bowie 11/06/2018, 7:02 AM

## 2018-11-17 NOTE — Addendum Note (Signed)
Addendum  created 11/17/18 1107 by Albertha Ghee, MD   Intraprocedure Blocks edited, Sign clinical note

## 2018-12-05 DIAGNOSIS — M25532 Pain in left wrist: Secondary | ICD-10-CM | POA: Insufficient documentation

## 2018-12-05 DIAGNOSIS — S52522A Torus fracture of lower end of left radius, initial encounter for closed fracture: Secondary | ICD-10-CM | POA: Diagnosis not present

## 2018-12-05 DIAGNOSIS — S62645A Nondisplaced fracture of proximal phalanx of left ring finger, initial encounter for closed fracture: Secondary | ICD-10-CM | POA: Diagnosis not present

## 2018-12-05 DIAGNOSIS — S62609A Fracture of unspecified phalanx of unspecified finger, initial encounter for closed fracture: Secondary | ICD-10-CM | POA: Insufficient documentation

## 2018-12-05 DIAGNOSIS — S5292XA Unspecified fracture of left forearm, initial encounter for closed fracture: Secondary | ICD-10-CM | POA: Insufficient documentation

## 2018-12-09 ENCOUNTER — Encounter (HOSPITAL_COMMUNITY): Payer: Self-pay | Admitting: *Deleted

## 2018-12-09 ENCOUNTER — Other Ambulatory Visit: Payer: Self-pay

## 2018-12-09 NOTE — Progress Notes (Signed)
Pt denies SOB, chest pain, and being under the care of a cardiologist. Pt denies having a stress test, echo and cardiac cath. Pt denies having a chest x ray within the last year. Pt denies recent labs. Pt made aware to take Metformin on DOS. Pt made aware to check BG every 2 hours prior to arrival to hospital on DOS. Pt made aware to treat a BG < 70 with 4 glucose tabs, wait 15 minutes after intervention to recheck BG, if BG remains < 70, call Short Stay unit to speak with a nurse. Pt made aware to stop taking vitamins, fish oil and herbal medications. Do not take any NSAIDs ie: Ibuprofen, Advil, Naproxen (Aleve), Motrin, BC and Goody Powder. Pt verbalized understanding of all pre-op instructions.

## 2018-12-10 ENCOUNTER — Ambulatory Visit (HOSPITAL_COMMUNITY): Admission: RE | Admit: 2018-12-10 | Payer: Medicare Other | Source: Home / Self Care | Admitting: Orthopedic Surgery

## 2018-12-10 DIAGNOSIS — S62647A Nondisplaced fracture of proximal phalanx of left little finger, initial encounter for closed fracture: Secondary | ICD-10-CM | POA: Diagnosis not present

## 2018-12-10 DIAGNOSIS — S62617A Displaced fracture of proximal phalanx of left little finger, initial encounter for closed fracture: Secondary | ICD-10-CM | POA: Diagnosis not present

## 2018-12-10 DIAGNOSIS — Y999 Unspecified external cause status: Secondary | ICD-10-CM | POA: Diagnosis not present

## 2018-12-10 DIAGNOSIS — X58XXXA Exposure to other specified factors, initial encounter: Secondary | ICD-10-CM | POA: Diagnosis not present

## 2018-12-10 DIAGNOSIS — S62645A Nondisplaced fracture of proximal phalanx of left ring finger, initial encounter for closed fracture: Secondary | ICD-10-CM | POA: Diagnosis not present

## 2018-12-10 DIAGNOSIS — S62615A Displaced fracture of proximal phalanx of left ring finger, initial encounter for closed fracture: Secondary | ICD-10-CM | POA: Diagnosis not present

## 2018-12-10 HISTORY — DX: Other injury of unspecified body region, initial encounter: T14.8XXA

## 2018-12-10 SURGERY — PINNING, EXTREMITY, PERCUTANEOUS
Anesthesia: General | Laterality: Left

## 2018-12-11 DIAGNOSIS — G44021 Chronic cluster headache, intractable: Secondary | ICD-10-CM | POA: Diagnosis not present

## 2018-12-19 DIAGNOSIS — Z5189 Encounter for other specified aftercare: Secondary | ICD-10-CM | POA: Diagnosis not present

## 2018-12-19 DIAGNOSIS — S62645D Nondisplaced fracture of proximal phalanx of left ring finger, subsequent encounter for fracture with routine healing: Secondary | ICD-10-CM | POA: Diagnosis not present

## 2018-12-30 DIAGNOSIS — Z7984 Long term (current) use of oral hypoglycemic drugs: Secondary | ICD-10-CM | POA: Diagnosis not present

## 2018-12-30 DIAGNOSIS — F3341 Major depressive disorder, recurrent, in partial remission: Secondary | ICD-10-CM | POA: Diagnosis not present

## 2018-12-30 DIAGNOSIS — K219 Gastro-esophageal reflux disease without esophagitis: Secondary | ICD-10-CM | POA: Diagnosis not present

## 2018-12-30 DIAGNOSIS — I10 Essential (primary) hypertension: Secondary | ICD-10-CM | POA: Diagnosis not present

## 2018-12-30 DIAGNOSIS — E785 Hyperlipidemia, unspecified: Secondary | ICD-10-CM | POA: Diagnosis not present

## 2018-12-30 DIAGNOSIS — E1169 Type 2 diabetes mellitus with other specified complication: Secondary | ICD-10-CM | POA: Diagnosis not present

## 2018-12-30 DIAGNOSIS — G43909 Migraine, unspecified, not intractable, without status migrainosus: Secondary | ICD-10-CM | POA: Diagnosis not present

## 2019-01-01 DIAGNOSIS — S62605D Fracture of unspecified phalanx of left ring finger, subsequent encounter for fracture with routine healing: Secondary | ICD-10-CM | POA: Diagnosis not present

## 2019-01-01 DIAGNOSIS — Z5189 Encounter for other specified aftercare: Secondary | ICD-10-CM | POA: Diagnosis not present

## 2019-01-02 DIAGNOSIS — M79642 Pain in left hand: Secondary | ICD-10-CM | POA: Diagnosis not present

## 2019-01-06 DIAGNOSIS — M25532 Pain in left wrist: Secondary | ICD-10-CM | POA: Diagnosis not present

## 2019-01-09 DIAGNOSIS — M25532 Pain in left wrist: Secondary | ICD-10-CM | POA: Diagnosis not present

## 2019-01-12 DIAGNOSIS — M79642 Pain in left hand: Secondary | ICD-10-CM | POA: Diagnosis not present

## 2019-01-15 DIAGNOSIS — Z5189 Encounter for other specified aftercare: Secondary | ICD-10-CM | POA: Diagnosis not present

## 2019-01-15 DIAGNOSIS — S62605D Fracture of unspecified phalanx of left ring finger, subsequent encounter for fracture with routine healing: Secondary | ICD-10-CM | POA: Diagnosis not present

## 2019-01-16 DIAGNOSIS — M79642 Pain in left hand: Secondary | ICD-10-CM | POA: Diagnosis not present

## 2019-01-20 DIAGNOSIS — M79642 Pain in left hand: Secondary | ICD-10-CM | POA: Diagnosis not present

## 2019-01-23 DIAGNOSIS — M25532 Pain in left wrist: Secondary | ICD-10-CM | POA: Diagnosis not present

## 2019-01-26 DIAGNOSIS — M79642 Pain in left hand: Secondary | ICD-10-CM | POA: Diagnosis not present

## 2019-01-30 DIAGNOSIS — M79642 Pain in left hand: Secondary | ICD-10-CM | POA: Diagnosis not present

## 2019-02-03 DIAGNOSIS — M79642 Pain in left hand: Secondary | ICD-10-CM | POA: Diagnosis not present

## 2019-02-06 DIAGNOSIS — S62645D Nondisplaced fracture of proximal phalanx of left ring finger, subsequent encounter for fracture with routine healing: Secondary | ICD-10-CM | POA: Diagnosis not present

## 2019-02-06 DIAGNOSIS — Z5189 Encounter for other specified aftercare: Secondary | ICD-10-CM | POA: Diagnosis not present

## 2019-02-06 DIAGNOSIS — M79642 Pain in left hand: Secondary | ICD-10-CM | POA: Diagnosis not present

## 2019-02-10 DIAGNOSIS — M79642 Pain in left hand: Secondary | ICD-10-CM | POA: Diagnosis not present

## 2019-02-12 DIAGNOSIS — M79642 Pain in left hand: Secondary | ICD-10-CM | POA: Diagnosis not present

## 2019-02-14 IMAGING — CT CT HEAD W/O CM
3 of 4 series · 15 of 47 positions shown, 18 images · non-contrast
Comparison: 12/21/2015, 12/25/2013

CLINICAL DATA: Altered level of consciousness

EXAM:
CT HEAD WITHOUT CONTRAST
TECHNIQUE: Contiguous axial images were obtained from the base of the skull
through the vertex without intravenous contrast.

[Series 4: head 2.0 h70h · axial · 0.45mm/px · z∈[-87,+29]mm · 9 of 74 slices shown, 12 images]
[im 8/74  brain]
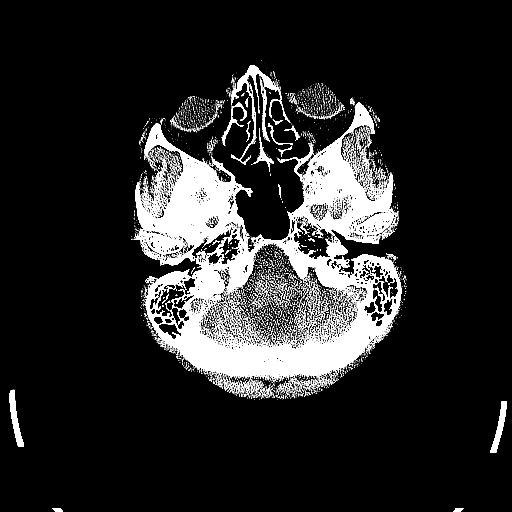
[im 8/74  bone]
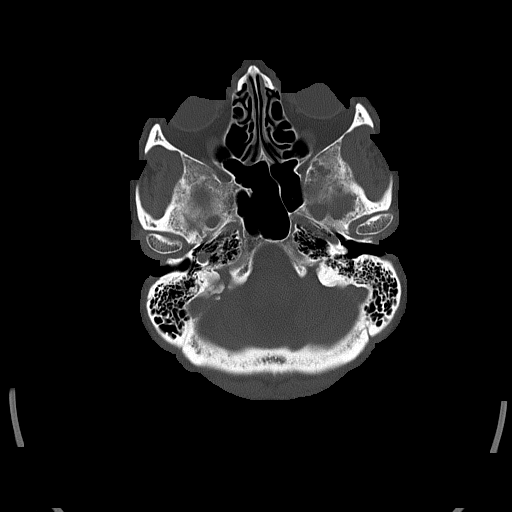
[im 15/74  brain]
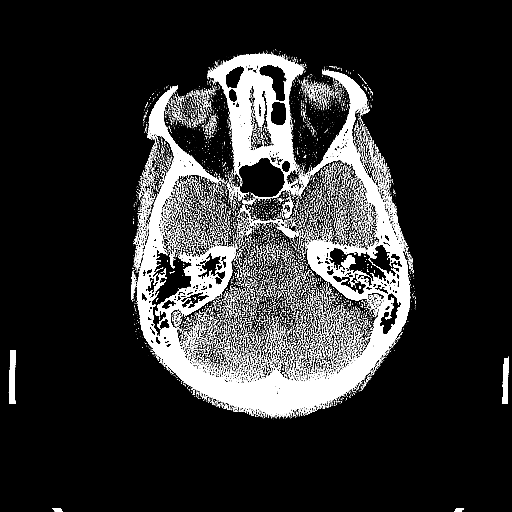
[im 22/74  brain]
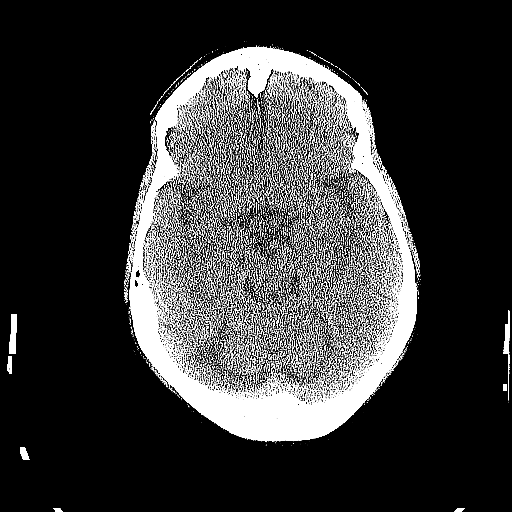
[im 30/74  brain]
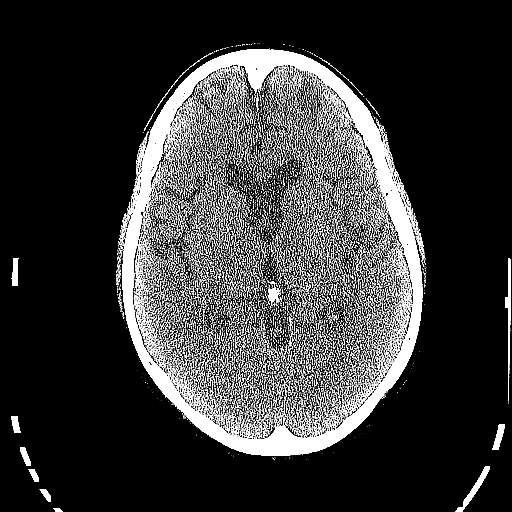
[im 37/74  brain]
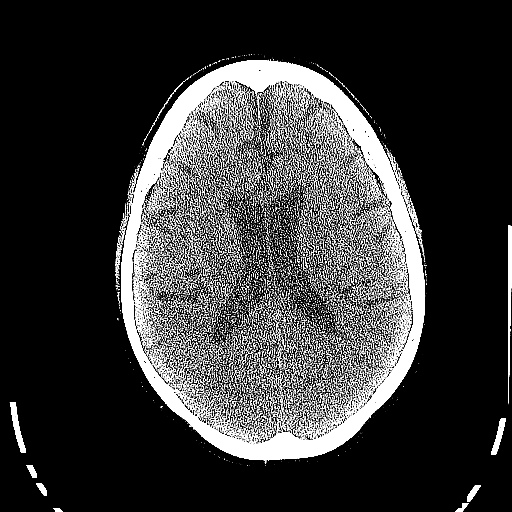
[im 37/74  bone]
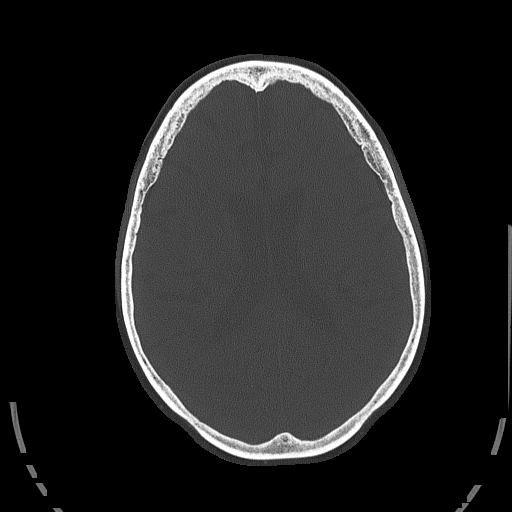
[im 44/74  brain]
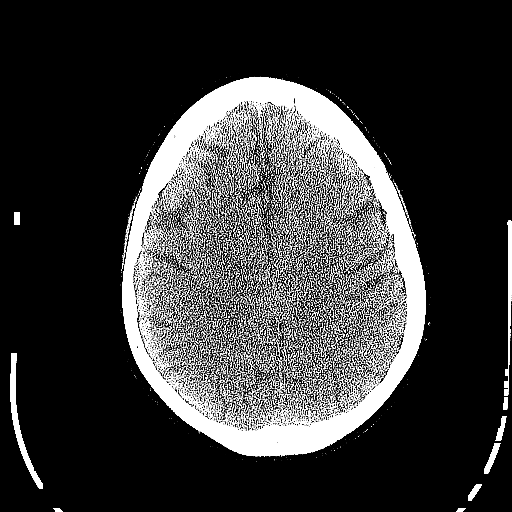
[im 52/74  brain]
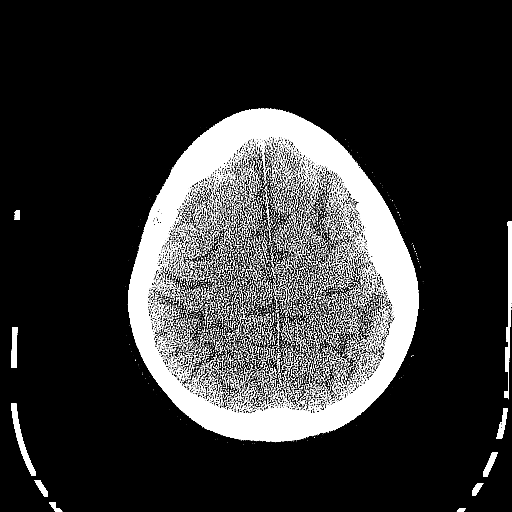
[im 59/74  brain]
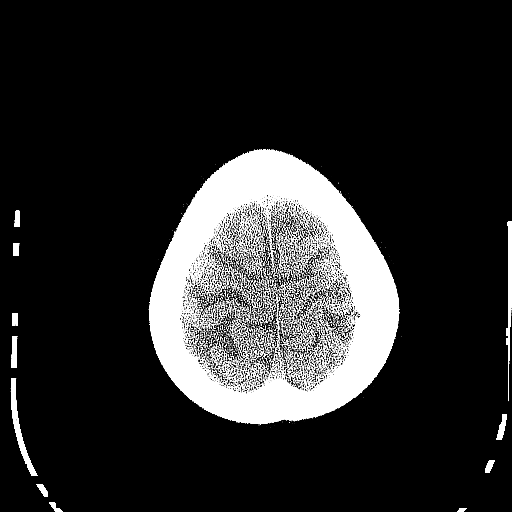
[im 66/74  brain]
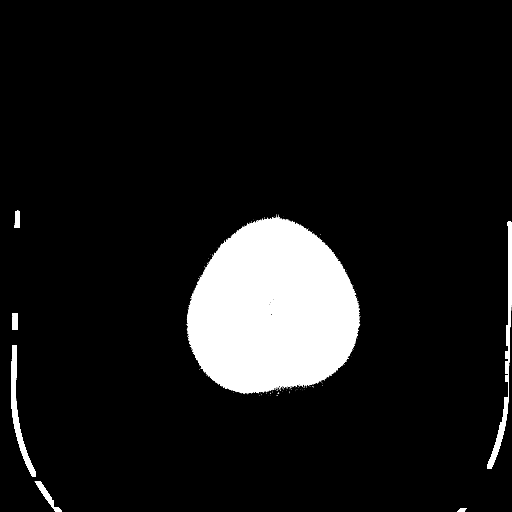
[im 66/74  bone]
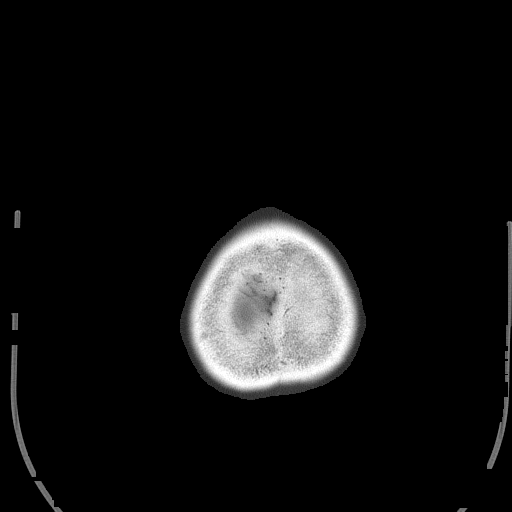

[Series 5: head 3.0 mpr cor · coronal · 0.30mm/px · 3 of 67 slices shown]
[im 23/67  brain]
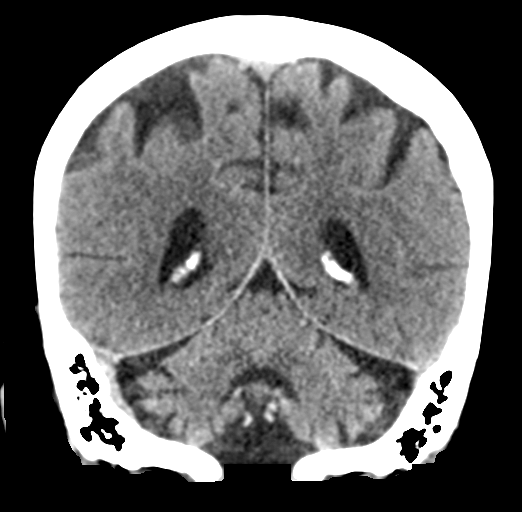
[im 30/67  brain]
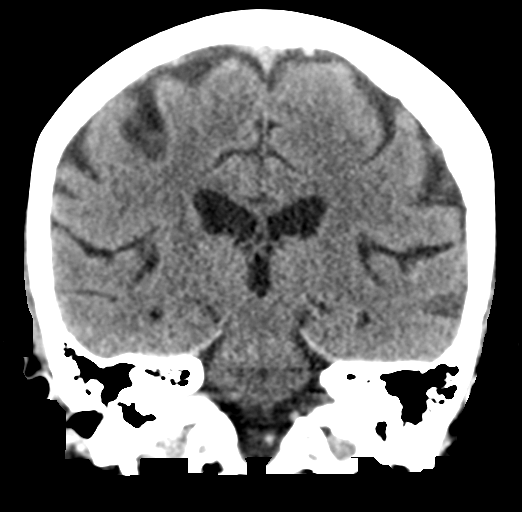
[im 37/67  brain]
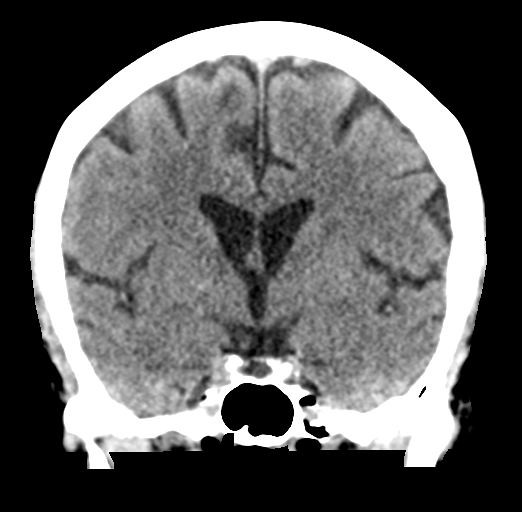

[Series 6: head 3.0 mpr sag · sagittal · 0.29mm/px · 3 of 59 slices shown]
[im 20/59  brain]
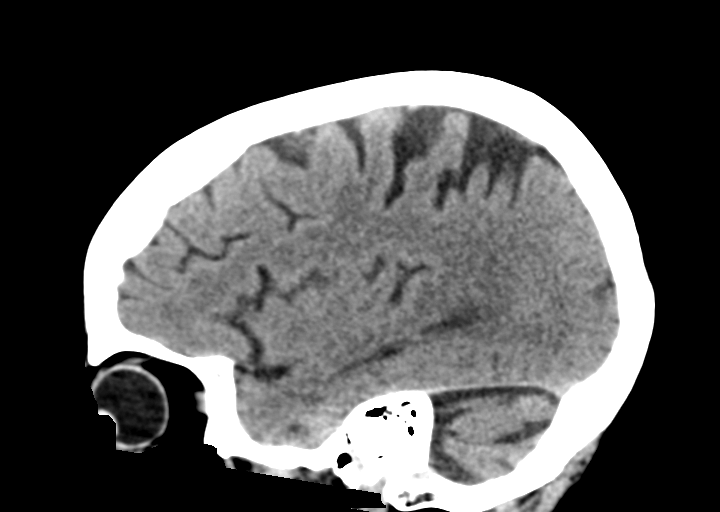
[im 30/59  brain]
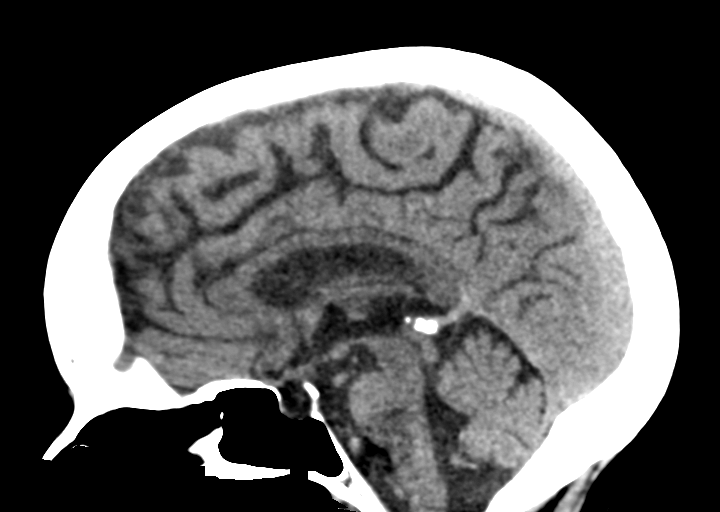
[im 39/59  brain]
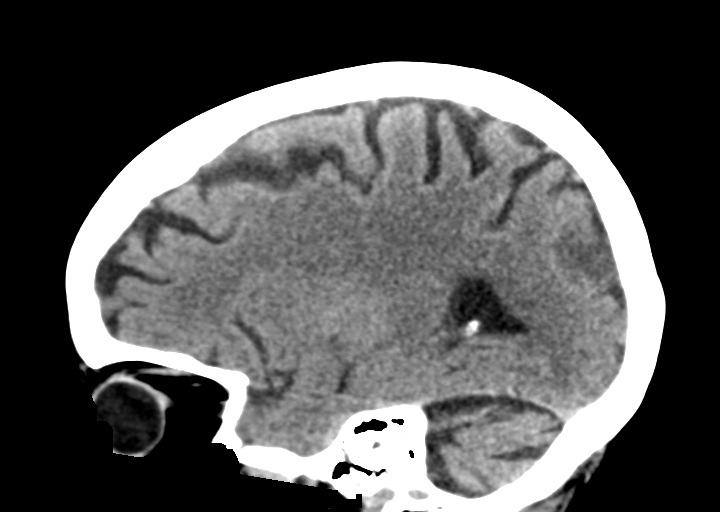

[15 of 47 positions shown; findings below may reference images not displayed]

FINDINGS: Brain: No acute territorial infarction, hemorrhage or intracranial
mass is visualized. Mild atrophy. Mildly prominent ventricles likely
due to atrophy.

Vascular: No hyperdense vessels.  Carotid vessel calcification

Skull: Normal. Negative for fracture or focal lesion.

Sinuses/Orbits: No acute finding.

Other: None
IMPRESSION: No CT evidence for acute intracranial abnormality.

## 2019-02-17 DIAGNOSIS — M79642 Pain in left hand: Secondary | ICD-10-CM | POA: Diagnosis not present

## 2019-02-23 DIAGNOSIS — M79642 Pain in left hand: Secondary | ICD-10-CM | POA: Diagnosis not present

## 2019-02-25 DIAGNOSIS — M79642 Pain in left hand: Secondary | ICD-10-CM | POA: Diagnosis not present

## 2019-03-03 DIAGNOSIS — M79642 Pain in left hand: Secondary | ICD-10-CM | POA: Diagnosis not present

## 2019-03-06 DIAGNOSIS — S62605D Fracture of unspecified phalanx of left ring finger, subsequent encounter for fracture with routine healing: Secondary | ICD-10-CM | POA: Diagnosis not present

## 2019-03-06 DIAGNOSIS — M79642 Pain in left hand: Secondary | ICD-10-CM | POA: Diagnosis not present

## 2019-03-09 DIAGNOSIS — G44021 Chronic cluster headache, intractable: Secondary | ICD-10-CM | POA: Diagnosis not present

## 2019-03-10 DIAGNOSIS — S62605D Fracture of unspecified phalanx of left ring finger, subsequent encounter for fracture with routine healing: Secondary | ICD-10-CM | POA: Diagnosis not present

## 2019-03-11 DIAGNOSIS — M79642 Pain in left hand: Secondary | ICD-10-CM | POA: Diagnosis not present

## 2019-03-13 DIAGNOSIS — M79642 Pain in left hand: Secondary | ICD-10-CM | POA: Diagnosis not present

## 2019-04-09 DIAGNOSIS — S62605D Fracture of unspecified phalanx of left ring finger, subsequent encounter for fracture with routine healing: Secondary | ICD-10-CM | POA: Diagnosis not present

## 2019-04-13 DIAGNOSIS — R21 Rash and other nonspecific skin eruption: Secondary | ICD-10-CM | POA: Diagnosis not present

## 2019-05-07 DIAGNOSIS — S62605D Fracture of unspecified phalanx of left ring finger, subsequent encounter for fracture with routine healing: Secondary | ICD-10-CM | POA: Diagnosis not present

## 2019-06-08 DIAGNOSIS — G44021 Chronic cluster headache, intractable: Secondary | ICD-10-CM | POA: Diagnosis not present

## 2019-06-15 DIAGNOSIS — S62641D Nondisplaced fracture of proximal phalanx of left index finger, subsequent encounter for fracture with routine healing: Secondary | ICD-10-CM | POA: Diagnosis not present

## 2019-06-15 DIAGNOSIS — M24642 Ankylosis, left hand: Secondary | ICD-10-CM | POA: Diagnosis not present

## 2019-06-15 DIAGNOSIS — G8918 Other acute postprocedural pain: Secondary | ICD-10-CM | POA: Diagnosis not present

## 2019-06-15 DIAGNOSIS — S62647D Nondisplaced fracture of proximal phalanx of left little finger, subsequent encounter for fracture with routine healing: Secondary | ICD-10-CM | POA: Diagnosis not present

## 2019-06-15 DIAGNOSIS — M24542 Contracture, left hand: Secondary | ICD-10-CM | POA: Diagnosis not present

## 2019-06-15 DIAGNOSIS — S62645D Nondisplaced fracture of proximal phalanx of left ring finger, subsequent encounter for fracture with routine healing: Secondary | ICD-10-CM | POA: Diagnosis not present

## 2019-06-15 DIAGNOSIS — S62643D Nondisplaced fracture of proximal phalanx of left middle finger, subsequent encounter for fracture with routine healing: Secondary | ICD-10-CM | POA: Diagnosis not present

## 2019-06-18 DIAGNOSIS — Z4789 Encounter for other orthopedic aftercare: Secondary | ICD-10-CM | POA: Diagnosis not present

## 2019-06-18 DIAGNOSIS — Z5189 Encounter for other specified aftercare: Secondary | ICD-10-CM | POA: Diagnosis not present

## 2019-06-18 DIAGNOSIS — M25532 Pain in left wrist: Secondary | ICD-10-CM | POA: Diagnosis not present

## 2019-06-18 DIAGNOSIS — S62645A Nondisplaced fracture of proximal phalanx of left ring finger, initial encounter for closed fracture: Secondary | ICD-10-CM | POA: Diagnosis not present

## 2019-06-18 DIAGNOSIS — M79642 Pain in left hand: Secondary | ICD-10-CM | POA: Diagnosis not present

## 2019-06-18 DIAGNOSIS — S62605D Fracture of unspecified phalanx of left ring finger, subsequent encounter for fracture with routine healing: Secondary | ICD-10-CM | POA: Diagnosis not present

## 2019-06-19 DIAGNOSIS — M79642 Pain in left hand: Secondary | ICD-10-CM | POA: Insufficient documentation

## 2019-06-22 DIAGNOSIS — M79642 Pain in left hand: Secondary | ICD-10-CM | POA: Diagnosis not present

## 2019-06-22 DIAGNOSIS — M25532 Pain in left wrist: Secondary | ICD-10-CM | POA: Diagnosis not present

## 2019-06-24 DIAGNOSIS — I10 Essential (primary) hypertension: Secondary | ICD-10-CM | POA: Diagnosis not present

## 2019-06-24 DIAGNOSIS — F3341 Major depressive disorder, recurrent, in partial remission: Secondary | ICD-10-CM | POA: Diagnosis not present

## 2019-06-24 DIAGNOSIS — K219 Gastro-esophageal reflux disease without esophagitis: Secondary | ICD-10-CM | POA: Diagnosis not present

## 2019-06-24 DIAGNOSIS — E785 Hyperlipidemia, unspecified: Secondary | ICD-10-CM | POA: Diagnosis not present

## 2019-06-24 DIAGNOSIS — Z7984 Long term (current) use of oral hypoglycemic drugs: Secondary | ICD-10-CM | POA: Diagnosis not present

## 2019-06-24 DIAGNOSIS — Z1389 Encounter for screening for other disorder: Secondary | ICD-10-CM | POA: Diagnosis not present

## 2019-06-24 DIAGNOSIS — Z0000 Encounter for general adult medical examination without abnormal findings: Secondary | ICD-10-CM | POA: Diagnosis not present

## 2019-06-24 DIAGNOSIS — E1169 Type 2 diabetes mellitus with other specified complication: Secondary | ICD-10-CM | POA: Diagnosis not present

## 2019-06-25 DIAGNOSIS — M25532 Pain in left wrist: Secondary | ICD-10-CM | POA: Diagnosis not present

## 2019-06-25 DIAGNOSIS — M79642 Pain in left hand: Secondary | ICD-10-CM | POA: Diagnosis not present

## 2019-06-29 DIAGNOSIS — M25532 Pain in left wrist: Secondary | ICD-10-CM | POA: Diagnosis not present

## 2019-06-29 DIAGNOSIS — M79642 Pain in left hand: Secondary | ICD-10-CM | POA: Diagnosis not present

## 2019-07-03 DIAGNOSIS — M25532 Pain in left wrist: Secondary | ICD-10-CM | POA: Diagnosis not present

## 2019-07-03 DIAGNOSIS — M79642 Pain in left hand: Secondary | ICD-10-CM | POA: Diagnosis not present

## 2019-07-07 DIAGNOSIS — M79642 Pain in left hand: Secondary | ICD-10-CM | POA: Diagnosis not present

## 2019-07-07 DIAGNOSIS — M25532 Pain in left wrist: Secondary | ICD-10-CM | POA: Diagnosis not present

## 2019-07-10 DIAGNOSIS — M25532 Pain in left wrist: Secondary | ICD-10-CM | POA: Diagnosis not present

## 2019-07-10 DIAGNOSIS — M79642 Pain in left hand: Secondary | ICD-10-CM | POA: Diagnosis not present

## 2019-07-16 DIAGNOSIS — R351 Nocturia: Secondary | ICD-10-CM | POA: Diagnosis not present

## 2019-07-16 DIAGNOSIS — N3941 Urge incontinence: Secondary | ICD-10-CM | POA: Diagnosis not present

## 2019-07-23 DIAGNOSIS — S62605D Fracture of unspecified phalanx of left ring finger, subsequent encounter for fracture with routine healing: Secondary | ICD-10-CM | POA: Diagnosis not present

## 2019-07-23 DIAGNOSIS — Z5189 Encounter for other specified aftercare: Secondary | ICD-10-CM | POA: Diagnosis not present

## 2019-08-17 DIAGNOSIS — H52203 Unspecified astigmatism, bilateral: Secondary | ICD-10-CM | POA: Diagnosis not present

## 2019-08-17 DIAGNOSIS — H26492 Other secondary cataract, left eye: Secondary | ICD-10-CM | POA: Diagnosis not present

## 2019-08-17 DIAGNOSIS — E119 Type 2 diabetes mellitus without complications: Secondary | ICD-10-CM | POA: Diagnosis not present

## 2019-08-17 DIAGNOSIS — H2511 Age-related nuclear cataract, right eye: Secondary | ICD-10-CM | POA: Diagnosis not present

## 2019-09-05 DIAGNOSIS — Z23 Encounter for immunization: Secondary | ICD-10-CM | POA: Diagnosis not present

## 2019-09-08 DIAGNOSIS — G44021 Chronic cluster headache, intractable: Secondary | ICD-10-CM | POA: Diagnosis not present

## 2019-09-11 ENCOUNTER — Other Ambulatory Visit: Payer: Self-pay | Admitting: Family Medicine

## 2019-09-11 DIAGNOSIS — Z1231 Encounter for screening mammogram for malignant neoplasm of breast: Secondary | ICD-10-CM

## 2019-09-25 DIAGNOSIS — Z20828 Contact with and (suspected) exposure to other viral communicable diseases: Secondary | ICD-10-CM | POA: Diagnosis not present

## 2019-10-01 DIAGNOSIS — R079 Chest pain, unspecified: Secondary | ICD-10-CM | POA: Diagnosis not present

## 2019-10-27 ENCOUNTER — Other Ambulatory Visit: Payer: Self-pay

## 2019-10-27 ENCOUNTER — Ambulatory Visit
Admission: RE | Admit: 2019-10-27 | Discharge: 2019-10-27 | Disposition: A | Discharge status: Home / Self Care | Payer: Medicare Other | Source: Ambulatory Visit | Attending: Family Medicine | Admitting: Family Medicine

## 2019-10-27 DIAGNOSIS — Z1231 Encounter for screening mammogram for malignant neoplasm of breast: Secondary | ICD-10-CM | POA: Diagnosis not present

## 2019-10-28 ENCOUNTER — Other Ambulatory Visit: Payer: Self-pay | Admitting: *Deleted

## 2019-10-28 NOTE — Patient Outreach (Signed)
[place] [facility]) Care Management  10/28/2019  [name] 11-13-1951 [id number]   Telephone Screen  Referral Date:  10/27/2019 Referral Source:  EMMI Prevent Reason for Referral:  Screening Insurance:  [employer]na Medicare   Outreach Attempt:  Successful telephone outreach to patient for telephone screening post EMMI Prevent call.  HIPAA verified with patient.  Patient completed telephone screening.  Social:  Patient lives at home with husband.  Reports being independent with ADLs and IADLs.  Ambulates independently and reports 1 fall in the past year.  [place] in October 2020 and hit head after losing balance.  Did not seek medical attention and states she did not loose consciousness.  Reports her husband drives her to her medical appointments.  DME in the home include:  Straight cane, rolling walker, blood pressure cuff, shower chair with back, CBG meter, eyeglasses, grab bar in shower, and scale.  Conditions:  Per chart review and discussion with patient, PMH include but not limited to:  Diabetes, headache, hyperlipidemia, brain tumor, hypertension, memory loss, seizures, depression, and hernia repair.  Patient denies any recent hospitalizations or emergency room visits.  Reports monitoring blood pressure about once a week.  Unsure of recent readings but state her blood pressure has been running "fine".  Monitors her blood sugars about once a week.  Patient also unsure of her blood sugar readings, stating her husband helps her check.  She is able to recall her recent Hgb A1C of 6.4 on 06/24/2019.  Medications:  Patient reports taking about 15 medications daily.  States she manages her medications herself without difficulties and denies any issues affording her co pays.  Appointments:  Patient reports last seeing her primary care provider, Dr. Tamala Julian on 06/24/2019.  Advanced Directives: Denies having an Advance Directive in place and does not wish to create one at this time.   Consent:   [facility] services reviewed and discussed with patient.  Patient verbally agrees to Disease Management outreaches.  Plan: RN Health Coach will send patient [place] Letter. RN Health Coach will make next telephone outreach to patient within the month of November to complete initial telephone assessment.  [place] [phone] [name]

## 2019-11-10 ENCOUNTER — Other Ambulatory Visit: Payer: Self-pay | Admitting: *Deleted

## 2019-11-10 NOTE — Patient Outreach (Signed)
[place] [facility]) Care Management  11/10/2019  NEELA BERKLAND January 20, 1951 [id number]   [place] Initial Assessment  Referral Date:  10/27/2019 Referral Source:  EMMI Prevent Reason for Referral:  Screening Insurance:  A[employer]na Medicare   Outreach Attempt:  Outreach attempt #1 to patient for initial telephone assessment. No answer and unable to leave voicemail message due to voicemail did not engage.  Plan:  RN Health Coach will attempt another outreach to patient within the month of December.   [place] [phone] [email or web address]

## 2019-11-11 DIAGNOSIS — K5901 Slow transit constipation: Secondary | ICD-10-CM | POA: Diagnosis not present

## 2019-11-11 DIAGNOSIS — R1032 Left lower quadrant pain: Secondary | ICD-10-CM | POA: Diagnosis not present

## 2019-11-21 ENCOUNTER — Other Ambulatory Visit: Payer: Self-pay

## 2019-11-21 ENCOUNTER — Encounter (HOSPITAL_COMMUNITY): Payer: Self-pay

## 2019-11-21 ENCOUNTER — Emergency Department (HOSPITAL_COMMUNITY)
Admission: EM | Admit: 2019-11-21 | Discharge: 2019-11-21 | Disposition: A | Discharge status: Home / Self Care | Payer: Medicare Other | Attending: Emergency Medicine | Admitting: Emergency Medicine

## 2019-11-21 DIAGNOSIS — Z7984 Long term (current) use of oral hypoglycemic drugs: Secondary | ICD-10-CM | POA: Insufficient documentation

## 2019-11-21 DIAGNOSIS — I10 Essential (primary) hypertension: Secondary | ICD-10-CM | POA: Diagnosis not present

## 2019-11-21 DIAGNOSIS — R519 Headache, unspecified: Secondary | ICD-10-CM

## 2019-11-21 DIAGNOSIS — R110 Nausea: Secondary | ICD-10-CM | POA: Diagnosis not present

## 2019-11-21 DIAGNOSIS — F0390 Unspecified dementia without behavioral disturbance: Secondary | ICD-10-CM | POA: Insufficient documentation

## 2019-11-21 DIAGNOSIS — H53149 Visual discomfort, unspecified: Secondary | ICD-10-CM | POA: Insufficient documentation

## 2019-11-21 DIAGNOSIS — G43909 Migraine, unspecified, not intractable, without status migrainosus: Secondary | ICD-10-CM | POA: Diagnosis not present

## 2019-11-21 DIAGNOSIS — Z79899 Other long term (current) drug therapy: Secondary | ICD-10-CM | POA: Insufficient documentation

## 2019-11-21 LAB — CBC WITH DIFFERENTIAL/PLATELET
Abs Immature Granulocytes: 0.03 10*3/uL (ref 0.00–0.07)
Basophils Absolute: 0 10*3/uL (ref 0.0–0.1)
Basophils Relative: 0 %
Eosinophils Absolute: 0.1 10*3/uL (ref 0.0–0.5)
Eosinophils Relative: 1 %
HCT: 37.6 % (ref 36.0–46.0)
Hemoglobin: 11.5 g/dL — ABNORMAL LOW (ref 12.0–15.0)
Immature Granulocytes: 0 %
Lymphocytes Relative: 35 %
Lymphs Abs: 3.3 10*3/uL (ref 0.7–4.0)
MCH: 29.7 pg (ref 26.0–34.0)
MCHC: 30.6 g/dL (ref 30.0–36.0)
MCV: 97.2 fL (ref 80.0–100.0)
Monocytes Absolute: 0.9 10*3/uL (ref 0.1–1.0)
Monocytes Relative: 10 %
Neutro Abs: 4.9 10*3/uL (ref 1.7–7.7)
Neutrophils Relative %: 54 %
Platelets: 213 10*3/uL (ref 150–400)
RBC: 3.87 MIL/uL (ref 3.87–5.11)
RDW: 14.6 % (ref 11.5–15.5)
WBC: 9.3 10*3/uL (ref 4.0–10.5)
nRBC: 0 % (ref 0.0–0.2)

## 2019-11-21 LAB — BASIC METABOLIC PANEL
Anion gap: 8 (ref 5–15)
BUN: 12 mg/dL (ref 8–23)
CO2: 23 mmol/L (ref 22–32)
Calcium: 8.9 mg/dL (ref 8.9–10.3)
Chloride: 111 mmol/L (ref 98–111)
Creatinine, Ser: 0.81 mg/dL (ref 0.44–1.00)
GFR calc Af Amer: >60 mL/min (ref >60)
GFR calc non Af Amer: >60 mL/min (ref >60)
Glucose, Bld: 85 mg/dL (ref 70–99)
Potassium: 3.8 mmol/L (ref 3.5–5.1)
Sodium: 142 mmol/L (ref 135–145)

## 2019-11-21 LAB — MAGNESIUM: Magnesium: 1.9 mg/dL (ref 1.7–2.4)

## 2019-11-21 MED ORDER — MAGNESIUM SULFATE 2 GM/50ML IV SOLN
2.0000 g | Freq: Once | INTRAVENOUS | Status: AC
  Administered 2019-11-21: 2 g via INTRAVENOUS
  Filled 2019-11-21: qty 50

## 2019-11-21 MED ORDER — DEXAMETHASONE SODIUM PHOSPHATE 10 MG/ML IJ SOLN
10.0000 mg | Freq: Once | INTRAMUSCULAR | Status: AC
  Administered 2019-11-21: 10 mg via INTRAVENOUS
  Filled 2019-11-21: qty 1

## 2019-11-21 MED ORDER — DROPERIDOL 2.5 MG/ML IJ SOLN
2.5000 mg | Freq: Once | INTRAMUSCULAR | Status: AC
  Administered 2019-11-21: 14:00:00 2.5 mg via INTRAVENOUS
  Filled 2019-11-21: qty 2

## 2019-11-21 MED ORDER — PROCHLORPERAZINE EDISYLATE 10 MG/2ML IJ SOLN
10.0000 mg | Freq: Once | INTRAMUSCULAR | Status: AC
  Administered 2019-11-21: 16:00:00 10 mg via INTRAVENOUS
  Filled 2019-11-21: qty 2

## 2019-11-21 MED ORDER — ONDANSETRON HCL 4 MG/2ML IJ SOLN
4.0000 mg | Freq: Once | INTRAMUSCULAR | Status: AC
  Administered 2019-11-21: 14:00:00 4 mg via INTRAVENOUS
  Filled 2019-11-21: qty 2

## 2019-11-21 MED ORDER — DIPHENHYDRAMINE HCL 50 MG/ML IJ SOLN
25.0000 mg | Freq: Once | INTRAMUSCULAR | Status: AC
  Administered 2019-11-21: 16:00:00 25 mg via INTRAVENOUS
  Filled 2019-11-21: qty 1

## 2019-11-21 MED ORDER — SODIUM CHLORIDE 0.9 % IV BOLUS
1000.0000 mL | Freq: Once | INTRAVENOUS | Status: AC
  Administered 2019-11-21: 14:00:00 1000 mL via INTRAVENOUS

## 2019-11-21 NOTE — ED Provider Notes (Signed)
Assumed from PA Domenic Moras at shift change, please see his note for full details, but in brief [name] is a 68 y.o. female who presents with headache, history of chronic migraines for which she has followed with neurology.  Reports she has been having daily headaches that are not responding to her medications at home.  Neuro exam is unremarkable.  Patient received droperidol and IV fluid bolus to help with headache, EKG showed some mild QT widening so basic labs and electrolytes were checked.    Plan: follow-up on electrolytes, and then reevaluate patient's headache after treatment.  3:12 PM On reevaluation patient reports no improvement in her headache after receiving droperidol, will give headache cocktail of Compazine, Benadryl, and Decadron, and will also give IV mag.   Labs Reviewed  CBC WITH DIFFERENTIAL/PLATELET - Abnormal; Notable for the following components:      Result Value   Hemoglobin 11.5 (*)    All other components within normal limits  MAGNESIUM  BASIC METABOLIC PANEL  CBC WITH DIFFERENTIAL/PLATELET   4:51 PM patient does report some improvement in her headache after receiving migraine cocktail, and magnesium.  Patient's lab work is reassuring with no electrolyte derangements.  Discussed with patient that we do not typically send them home with the same medications that were given in the migraine cocktail but that she should continue with her home medications and follow-up with her neurologist.  Patient expresses frustration over continued headaches, I discussed with the patient that I know this is been a long process for her and she seen numerous headache clinics and has been unable to find a medication that works to help abort her headaches or prevent them from returning, and discussed with her that I do not think there is another medication that would be beneficial for her to try from the emergency department setting.  Encouraged her to continue to follow with her  neurologist.  Return precautions discussed.  Will be discharged home in good condition.  Final diagnoses:  Bad headache      Jacqlyn Larsen, [place] 11/21/19 1653    Charlesetta Shanks, MD 12/07/19 1159

## 2019-11-21 NOTE — ED Notes (Signed)
Per lab- BMP and Magnesium need to be collected

## 2019-11-21 NOTE — Discharge Instructions (Addendum)
Please continue using your home medications for your headaches and follow-up with your neurologist.  Return for any new or worsening symptoms.

## 2019-11-21 NOTE — ED Triage Notes (Signed)
Pt arrives POV for eval of migraine onset 1 week, pt endorses nausea, no vomiting. Endorses photosensitivity, states hx of migraines, this feels similar, but more severe in nature.

## 2019-11-21 NOTE — ED Provider Notes (Signed)
[facility] EMERGENCY DEPARTMENT Provider Note   CSN: [id number] Arrival date & time: 11/21/19  1203     History   Chief Complaint Chief Complaint  Patient presents with  . Migraine    HPI [name] is a 68 y.o. female.     The history is provided by the patient and medical records. No language interpreter was used.  Migraine     68 year old female with history of daily migraine presenting for worsening headache.  Patient states she experiencing headache on a regular basis and she does have medication to treat for that.  However for the past week she endorsed progressive worsening of her usual headache.  She described headache as a throbbing sensation, loss of her head with associated nausea and light sensitivity.  Rates her headache as 10 out of 10, persistent and her medication has not seems to help.  She does not report of any fever or chills no runny nose sneezing coughing sore throat body aches or shortness of breath no chest pain or abdominal pain focal numbness or focal weakness.  She is unsure if there is any provocative factor causing the headache.  She denies any recent sick contact.  She does not report any neck stiffness.  Past Medical History:  Diagnosis Date  . Dementia ([place])   . Depression    denies  . Displaced fracture    left ring finger and small finger proximal phalanx  . Epilepsy, grand mal ([place])    "last seizure ~ 2001" (06/16/2015)  . High cholesterol   . Hypertension   . Migraine    "just about qd";  treated by Dr. [name] (10/15/2016)  . Migraines   . Pneumonia    h/o  . Pre-diabetes    she reports that she never took meds. for prediabetes, states MD has told her that she has corrected her situation with diet   . Seizures ([place])    "at one time; stopped in the 1990s" (10/15/2016)  . Stress at home    Pt. reports that she has a lot of personal stress & she is aware that it might being playing a part in her Migraine heaaches       Patient Active Problem List   Diagnosis Date Noted  . Altered mental status 11/08/2017  . Acute encephalopathy 11/07/2017  . Seizure disorder ([place]) 11/07/2017  . Dementia ([place]) 11/07/2017  . Essential hypertension 11/07/2017  . Headaches, cluster 11/07/2017  . AKI (acute kidney injury) ([place]) 11/07/2017  . Polypharmacy 05/15/2017  . Meningioma ([place]) 01/09/2016  . Depression 11/08/2015  . Abnormal head movements 11/08/2015  . Somnolence, daytime 07/14/2015  . Incisional hernia 06/16/2015  . Chronic headaches 04/01/2013    Past Surgical History:  Procedure Laterality Date  . HERNIA REPAIR    . INCISIONAL HERNIA REPAIR N/A 06/16/2015   Procedure: LAPAROSCOPIC INCISIONAL HERNIA WITH MESH;  Surgeon: Coralie Keens, MD;  Location: [facility];  Service: General;  Laterality: N/A;  . [place]  10/15/2016  . INCISIONAL HERNIA REPAIR N/A 10/15/2016   Procedure: OPEN INCISIONAL HERNIA REPAIR WITH MESH;  Surgeon: Coralie Keens, MD;  Location: [facility];  Service: General;  Laterality: N/A;  . INCISIONAL HERNIA REPAIR Left 11/05/2018   open; w/mesh  . INGUINAL HERNIA REPAIR Left 11/05/2018   Procedure: OPEN REPAIR LEFT HERNIA ERAS PATHWAY;  Surgeon: Coralie Keens, MD;  Location: [facility];  Service: General;  Laterality: Left;  TAP BLOCK  . INSERTION OF MESH N/A  06/16/2015   Procedure: INSERTION OF MESH;  Surgeon: Coralie Keens, MD;  Location: [facility];  Service: General;  Laterality: N/A;  . VAGINAL HYSTERECTOMY  ~ 1992     OB History   No obstetric history on file.      Home Medications    Prior to Admission medications   Medication Sig Start Date End Date Taking? Authorizing Provider  acetaminophen (TYLENOL) 500 MG tablet Take 1,000 mg by mouth 2 (two) times daily as needed for mild pain.     [provider]  amLODipine (NORVASC) 5 MG tablet Take 5 mg by mouth daily after breakfast.     [provider]  atorvastatin (LIPITOR) 80 MG tablet Take 80  mg by mouth daily.    [provider]  Calcium Carb-Cholecalciferol (CALCIUM 600 + D) 600-200 MG-UNIT TABS Take 1 tablet by mouth daily.    [provider]  cholecalciferol (VITAMIN D) 1000 units tablet Take 1,000 Units by mouth daily.    [provider]  conjugated estrogens (PREMARIN) vaginal cream Place 1 Applicatorful vaginally once a week.  08/27/17   [provider]  Cyanocobalamin (B-12) 2500 MCG TABS Take 2,500 mcg by mouth daily.    [provider]  divalproex (DEPAKOTE ER) 500 MG 24 hr tablet TAKE 1 TABLET BY MOUTH EVERY MORNING AND TAKE 2 TABLETS BY MOUTH EVERY EVENING Patient taking differently: Take 500 mg by mouth 2 (two) times daily.  10/11/16   Marcial Pacas, MD  Erenumab-aooe (AIMOVIG, 140 MG DOSE, [place]) Inject 140 mg into the skin every 30 (thirty) days.    [provider]  esomeprazole (NEXIUM) 40 MG capsule Take 40 mg by mouth 2 (two) times daily.    [provider]  ferrous sulfate 325 (65 FE) MG tablet Take 325 mg by mouth daily with breakfast.    [provider]  Magnesium 400 MG TABS Take 400 mg by mouth daily.    [provider]  metFORMIN (GLUCOPHAGE-XR) 500 MG 24 hr tablet Take 500 mg by mouth daily with breakfast.    [provider]  methocarbamol (ROBAXIN) 750 MG tablet Take 750 mg by mouth 2 (two) times daily as needed for muscle spasms.    [provider]  mirtazapine (REMERON) 15 MG tablet Take 15 mg by mouth at bedtime as needed (sleep).    [provider]  Multiple Vitamin (MULTIVITAMIN WITH MINERALS) TABS Take 1 tablet by mouth daily.    [provider]  MYRBETRIQ 50 MG TB24 tablet Take 50 mg daily by mouth. 09/23/17   [provider]  SUMAtriptan-naproxen (TREXIMET) 85-500 MG tablet Take 1 tablet by mouth 2 (two) times daily as needed for migraine. Max 2 tab per 24 hours, 1-2 days per week 10/07/18   [provider]  topiramate (TOPAMAX)  100 MG tablet Take 100 mg by mouth daily.  07/08/14   [provider]  traMADol (ULTRAM) 50 MG tablet Take 1 tablet (50 mg total) by mouth every 6 (six) hours as needed for moderate pain or severe pain. 11/06/18   Coralie Keens, MD  ZOMIG 5 MG nasal solution Place 1 spray into alternate nostrils daily as needed for migraine. 09/02/18   [provider]    Family History Family History  Problem Relation Age of Onset  . Lung cancer Mother   . Kidney failure Father   . Hypertension Other     Social History Social History   Tobacco Use  . Smoking  status: Never Smoker  . Smokeless tobacco: Never Used  Substance Use Topics  . Alcohol use: No    Alcohol/week: 0.0 standard drinks  . Drug use: No     Allergies   Aspirin, Nsaids, and Tolmetin   Review of Systems Review of Systems  All other systems reviewed and are negative.    Physical Exam Updated Vital Signs BP (!) 152/77 (BP Location: Left Arm)   Pulse 75   Temp 98.1 F (36.7 C) (Oral)   Resp 10   Ht 5' (1.524 m)   Wt 53.5 kg   SpO2 100%   BMI 23.05 kg/m   Physical Exam Vitals signs and nursing note reviewed.  Constitutional:      General: She is not in acute distress.    Appearance: She is well-developed.  HENT:     Head: Normocephalic and atraumatic.  Eyes:     Extraocular Movements: Extraocular movements intact.     Conjunctiva/sclera: Conjunctivae normal.     Pupils: Pupils are equal, round, and reactive to light.  Neck:     Musculoskeletal: Normal range of motion and neck supple. No neck rigidity.  Cardiovascular:     Rate and Rhythm: Normal rate and regular rhythm.     Heart sounds: Normal heart sounds.  Pulmonary:     Effort: Pulmonary effort is normal.     Breath sounds: Normal breath sounds.  Abdominal:     Palpations: Abdomen is soft.  Skin:    Findings: No rash.  Neurological:     Mental Status: She is alert and oriented to person, place, and time.     GCS: GCS eye  subscore is 4. GCS verbal subscore is 5. GCS motor subscore is 6.     Cranial Nerves: Cranial nerves are intact.     Sensory: Sensation is intact.     Motor: Motor function is intact.     Coordination: Coordination is intact.     Gait: Gait is intact.      ED Treatments / Results  Labs (all labs ordered are listed, but only abnormal results are displayed) Labs Reviewed  CBC WITH DIFFERENTIAL/PLATELET    EKG EKG Interpretation  Date/Time:  Saturday November 21 2019 13:22:32 EST Ventricular Rate:  68 PR Interval:    QRS Duration: 121 QT Interval:  414 QTC Calculation: 441 R Axis:   79 Text Interpretation: Sinus rhythm Borderline short PR interval Nonspecific intraventricular conduction delay Probable anteroseptal infarct, recent Abnormal ECG Confirmed by Carmin Muskrat [phone]) on 11/21/2019 1:44:07 PM   Radiology No results found.  Procedures Procedures (including critical care time)  Medications Ordered in ED Medications  droperidol (INAPSINE) 2.5 MG/ML injection 2.5 mg (has no administration in time range)  sodium chloride 0.9 % bolus 1,000 mL (1,000 mLs Intravenous New Bag/Given 11/21/19 1333)  ondansetron (ZOFRAN) injection 4 mg (4 mg Intravenous Given 11/21/19 1330)     Initial Impression / Assessment and Plan / ED Course  I have reviewed the triage vital signs and the nursing notes.  Pertinent labs & imaging results that were available during my care of the patient were reviewed by me and considered in my medical decision making (see chart for details).        BP (!) 152/77 (BP Location: Left Arm)   Pulse 75   Temp 98.1 F (36.7 C) (Oral)   Resp 10   Ht 5' (1.524 m)   Wt 53.5 kg   SpO2 100%   BMI 23.05 kg/m  Final Clinical Impressions(s) / ED Diagnoses   Final diagnoses:  Bad headache    ED Discharge Orders    None     Headache similar to previous, no fever, neck stiffness, neuro findings or new symptoms to suggest more serious  etiology.  I don't think SAH, ICH, meningitis, encephalitis, mass at this time.  No recent trauma.  I don't feel imaging necessary at this time.  Plan to control symptoms.  2:59 PM Pt sign out to Benedetto Goad, PA-C who will monitor pt and dispo pending migraine treatment.     Domenic Moras, PA-C 11/21/19 1500    Carmin Muskrat, MD 11/22/19 1244

## 2019-11-24 DIAGNOSIS — K219 Gastro-esophageal reflux disease without esophagitis: Secondary | ICD-10-CM | POA: Diagnosis not present

## 2019-11-24 DIAGNOSIS — G4700 Insomnia, unspecified: Secondary | ICD-10-CM | POA: Diagnosis not present

## 2019-11-24 DIAGNOSIS — R634 Abnormal weight loss: Secondary | ICD-10-CM | POA: Diagnosis not present

## 2019-11-26 ENCOUNTER — Encounter: Payer: Self-pay | Admitting: *Deleted

## 2019-11-26 ENCOUNTER — Other Ambulatory Visit: Payer: Self-pay | Admitting: *Deleted

## 2019-11-26 NOTE — Patient Outreach (Signed)
[facility]) Care Management  [place]  11/26/2019   [name] 17-Jul-1951 [id number]   [place] Initial Assessment   Referral Date:  10/27/2019 Referral Source:  EMMI Prevent Reason for Referral:  Screening Insurance:  [employer] Medicare   Outreach Attempt:  Successful telephone outreach to patient for initial telephone assessment.  HIPAA verified with patient.  Patient completed initial telephone assessment.  Social:  Patient lives at home with husband and reports being independent with ADLs and IADLs. Ambulates independently and reports 1 fall in the past year.  [place] in October 2020 and hit head after losing balance.  Did not seek medical attention and states she did not loose consciousness. Reports her husband drives her to her medical appointments.  DME in the home include:  Straight cane, rolling walker, blood pressure cuff, shower chair with back, CBG meter, eyeglasses, grab bar in shower, and scale.  Conditions:  Per chart review and discussion with patient, PMH include but not limited to: Diabetes, headache, hyperlipidemia, brain tumor, hypertension, memory loss, seizures, depression, and hernia repair.  Patient had recent emergency room visit on 11/21/2019 due to worsening migraine.  States migraine is better today but does come and go.  Admits to taking her Depakote only once a day instead of twice daily as ordered.  Discussed importance of medication compliance.  Reports monitoring blood pressure about once a week and states ranges have been normal but unsure of actual numbers..  Monitors her blood sugars about once a week.  Last blood sugar was 81 fasting and she states her fasting blood sugars normally range in the 80's.  She is able to recall her recent Hgb A1C of 6.4 on 06/24/2019.   Medications:  Patient reports taking about 15 medications daily.  States she manages her medications herself without difficulties and denies any issues affording  them.  Encounter Medications:  Outpatient Encounter Medications as of 11/26/2019  Medication Sig Note  . acetaminophen (TYLENOL) 500 MG tablet Take 1,000 mg by mouth 2 (two) times daily as needed for mild pain.    Marland Kitchen amLODipine (NORVASC) 5 MG tablet Take 5 mg by mouth daily.   Marland Kitchen atorvastatin (LIPITOR) 80 MG tablet Take 80 mg by mouth daily.   . Calcium Carb-Cholecalciferol (CALCIUM 600 + D) 600-200 MG-UNIT TABS Take 1 tablet by mouth daily.   . cholecalciferol (VITAMIN D) 1000 units tablet Take 1,000 Units by mouth daily.   Marland Kitchen conjugated estrogens (PREMARIN) vaginal cream Place 1 Applicatorful vaginally once a week.    . Cyanocobalamin (B-12) 2500 MCG TABS Take 2,500 mcg by mouth daily.   Marland Kitchen DEXILANT 60 MG capsule Take 1 capsule by mouth daily.   . divalproex (DEPAKOTE ER) 500 MG 24 hr tablet TAKE 1 TABLET BY MOUTH EVERY MORNING AND TAKE 2 TABLETS BY MOUTH EVERY EVENING (Patient taking differently: Take 500 mg by mouth 2 (two) times daily. ) 11/26/2019: Patient reports taking one tablet daily  . Erenumab-aooe (AIMOVIG, 140 MG DOSE, [place]) Inject 140 mg into the skin every 30 (thirty) days.   . famotidine (PEPCID) 40 MG tablet Take 40 mg by mouth 2 (two) times daily.    . ferrous sulfate 325 (65 FE) MG tablet Take 325 mg by mouth daily with breakfast.   . Magnesium 400 MG TABS Take 400 mg by mouth daily.   . metFORMIN (GLUCOPHAGE-XR) 500 MG 24 hr tablet Take 500 mg by mouth daily with breakfast.   . methocarbamol (ROBAXIN) 750 MG  tablet Take 750 mg by mouth 2 (two) times daily as needed for muscle spasms.   . Multiple Vitamin (MULTIVITAMIN WITH MINERALS) TABS Take 1 tablet by mouth daily.   Marland Kitchen topiramate (TOPAMAX) 100 MG tablet Take 100 mg by mouth daily.    . traZODone (DESYREL) 50 MG tablet 1 tablet at bedtime.   Marland Kitchen esomeprazole (NEXIUM) 40 MG capsule Take 40 mg by mouth 2 (two) times daily. 11/26/2019: Reports no longer taking medication  . SUMAtriptan-naproxen (TREXIMET) 85-500 MG tablet Take 1  tablet by mouth 2 (two) times daily as needed for migraine. Max 2 tab per 24 hours, 1-2 days per week   . UBRELVY 100 MG TABS Take 100 mg by mouth daily as needed for migraine. 11/26/2019: Reports not taking at this time   No facility-administered encounter medications on file as of 11/26/2019.     Functional Status:  In your present state of health, do you have any difficulty performing the following activities: 11/26/2019  Hearing? N  Vision? N  Difficulty concentrating or making decisions? N  Walking or climbing stairs? N  Dressing or bathing? N  Doing errands, shopping? N  Preparing Food and eating ? N  Using the Toilet? N  In the past six months, have you accidently leaked urine? Y  Comment some urine incontinence  Do you have problems with loss of bowel control? N  Managing your Medications? N  Managing your Finances? N  Housekeeping or managing your Housekeeping? N  Some recent data might be hidden    Fall/Depression Screening: Fall Risk  11/26/2019 10/28/2019 06/02/2014  Falls in the past year? 1 1 No  Comment fall in October 2020 fell in October 2020 -  Number falls in past yr: 0 0 -  Injury with Fall? 0 0 -  Comment - hit head but no injury or loss of consciousness -  Risk for fall due to : History of fall(s);Medication side effect;Impaired vision History of fall(s);Medication side effect -  Follow up Falls evaluation completed;Education provided;Falls prevention discussed Falls evaluation completed;Education provided;Falls prevention discussed -   PHQ 2/9 Scores 11/26/2019 10/28/2019 06/02/2014  PHQ - 2 Score 0 0 0   THN CM Care Plan Problem One     Most Recent Value  Care Plan Problem One  Knowledge deficiet related to self care management of diabetes and medication compliance.  Role Documenting the Problem One  [place] for Problem One  Active  THN Long Term Goal   Patient will maintain Hgb A1C of below 7 within the next 90 days.  THN Long Term Goal  Start Date  11/26/19  Interventions for Problem One Long Term Goal  Care plan and goals reviewed and discussed, reviewed medications and indications and encouraged medication compliance, discussed importance of taking depekote as prescribed, sending Living Well with Diabetes Educational Packet, encouraged to continue to monitor blood sugars at least weekly, encouraged to keep and attend scheduled medical appointments, encouraged to contact Neurologist if migraines continue to be worse or increase in frequency, encouraged patient to review hypo and hyperglycemia signs and symptoms     Appointments:  Patient reports attending appointment with primary care provider, Dr. Tamala Julian on 11/24/2019.  Reports having scheduled appointment with neurologist, Dr. Bjorn Loser on 03/02/2020; encouraged her to contact Neurologist if migraines continue to get worse or increase in frequency.  Advanced Directives: Denies having an Advance Directive in place and does not wish to create one at this time.   Consent:  [facility] services reviewed and discussed and patient verbally agrees to Disease Management outreaches.  Encouraged patient to review [facility] consents mailed and received and to return signed copy in return envelope sent.  Plan: RN Health Coach will send primary MD barriers letter. RN Health Coach will route initial telephone assessment note to primary MD. [place] will send patient Living Well with Diabetes Educational Packet. RN Health Coach will send patient 2021 Calendar Booklet. RN Health Coach will make next telephone outreach to patient within the month of March and patient agrees to outreach.  [place] [phone] [email or web address]

## 2020-01-15 ENCOUNTER — Ambulatory Visit: Payer: Medicare Other | Attending: Internal Medicine

## 2020-01-15 DIAGNOSIS — Z23 Encounter for immunization: Secondary | ICD-10-CM

## 2020-01-15 DIAGNOSIS — R198 Other specified symptoms and signs involving the digestive system and abdomen: Secondary | ICD-10-CM | POA: Diagnosis not present

## 2020-01-15 DIAGNOSIS — D509 Iron deficiency anemia, unspecified: Secondary | ICD-10-CM | POA: Diagnosis not present

## 2020-01-15 NOTE — Progress Notes (Signed)
   Covid-19 Vaccination Clinic  Name:  [name]    MRN: [id number] DOB: 1951-05-29  01/15/2020  [name] was observed post Covid-19 immunization for 15 minutes without incidence. She was provided with Vaccine Information Sheet and instruction to access the V-Safe system.   [name] was instructed to call 911 with any severe reactions post vaccine: Marland Kitchen Difficulty breathing  . Swelling of your face and throat  . A fast heartbeat  . A bad rash all over your body  . Dizziness and weakness    Immunizations Administered    Name Date Dose VIS Date Route   Pfizer COVID-19 Vaccine 01/15/2020  7:09 PM 0.3 mL 12/04/2019 Intramuscular   Manufacturer: [place]   Lot: [id number]   [place]: [id number]

## 2020-01-19 DIAGNOSIS — D509 Iron deficiency anemia, unspecified: Secondary | ICD-10-CM | POA: Diagnosis not present

## 2020-01-23 ENCOUNTER — Emergency Department (HOSPITAL_COMMUNITY)
Admission: EM | Admit: 2020-01-23 | Discharge: 2020-01-23 | Disposition: A | Discharge status: Home / Self Care | Payer: Medicare Other | Attending: Emergency Medicine | Admitting: Emergency Medicine

## 2020-01-23 ENCOUNTER — Encounter (HOSPITAL_COMMUNITY): Payer: Self-pay | Admitting: Emergency Medicine

## 2020-01-23 DIAGNOSIS — H53149 Visual discomfort, unspecified: Secondary | ICD-10-CM | POA: Insufficient documentation

## 2020-01-23 DIAGNOSIS — G43801 Other migraine, not intractable, with status migrainosus: Secondary | ICD-10-CM | POA: Diagnosis not present

## 2020-01-23 DIAGNOSIS — R519 Headache, unspecified: Secondary | ICD-10-CM | POA: Diagnosis present

## 2020-01-23 DIAGNOSIS — G43901 Migraine, unspecified, not intractable, with status migrainosus: Secondary | ICD-10-CM | POA: Diagnosis not present

## 2020-01-23 DIAGNOSIS — Z79899 Other long term (current) drug therapy: Secondary | ICD-10-CM | POA: Diagnosis not present

## 2020-01-23 DIAGNOSIS — I10 Essential (primary) hypertension: Secondary | ICD-10-CM | POA: Diagnosis not present

## 2020-01-23 DIAGNOSIS — Z7984 Long term (current) use of oral hypoglycemic drugs: Secondary | ICD-10-CM | POA: Insufficient documentation

## 2020-01-23 DIAGNOSIS — R4789 Other speech disturbances: Secondary | ICD-10-CM | POA: Diagnosis not present

## 2020-01-23 DIAGNOSIS — F0390 Unspecified dementia without behavioral disturbance: Secondary | ICD-10-CM | POA: Diagnosis not present

## 2020-01-23 MED ORDER — PROCHLORPERAZINE EDISYLATE 10 MG/2ML IJ SOLN
10.0000 mg | Freq: Once | INTRAMUSCULAR | Status: AC
  Administered 2020-01-23: 10 mg via INTRAVENOUS
  Filled 2020-01-23: qty 2

## 2020-01-23 MED ORDER — MAGNESIUM SULFATE 2 GM/50ML IV SOLN
2.0000 g | Freq: Once | INTRAVENOUS | Status: AC
  Administered 2020-01-23: 18:00:00 2 g via INTRAVENOUS
  Filled 2020-01-23: qty 50

## 2020-01-23 MED ORDER — DIPHENHYDRAMINE HCL 50 MG/ML IJ SOLN
25.0000 mg | Freq: Once | INTRAMUSCULAR | Status: AC
  Administered 2020-01-23: 25 mg via INTRAVENOUS
  Filled 2020-01-23: qty 1

## 2020-01-23 MED ORDER — SODIUM CHLORIDE 0.9 % IV BOLUS
1000.0000 mL | Freq: Once | INTRAVENOUS | Status: AC
  Administered 2020-01-23: 1000 mL via INTRAVENOUS

## 2020-01-23 NOTE — ED Notes (Signed)
Pt verbalized understanding of d/c instructions, follow up care and s/s requiring return to ED. Pt had no additional questions at this time.

## 2020-01-23 NOTE — ED Triage Notes (Signed)
Pt. Stated, I have a migraine headache.I started Thursday

## 2020-01-23 NOTE — ED Provider Notes (Signed)
[facility] EMERGENCY DEPARTMENT Provider Note   CSN: [id number] Arrival date & time: 01/23/20  1428     History Chief Complaint  Patient presents with  . Migraine    Michelle TRAVERS is a 69 y.o. female.  69yo F w/ PMH including dementia, migraines, seizures, HTN, HLD who p/w migraine. PT reports migraine "off and on" starting 2 days ago marked by L sided headache going around to L side. This migraine feels worse than usual. She states today she couldn't take the pain anymore and her husband made her come in. She endorses associated photophobia and some slowed speech. She has tried Excedrin, sumatriptan, methocarbamol, magnesium w/ slight relief for only ~10 min. She denies any recent changes to her medications.  No dizziness, visual changes, N/V, weakness, or trouble walking. No fevers, recent illness, or sick contacts.    Migraine       Past Medical History:  Diagnosis Date  . Cataract   . Dementia ([place])   . Depression    denies  . Displaced fracture    left ring finger and small finger proximal phalanx  . Epilepsy, grand mal ([place])    "last seizure ~ 2001" (06/16/2015)  . High cholesterol   . Hypertension   . Migraine    "just about qd";  treated by Dr. [name] (10/15/2016)  . Migraines   . Pneumonia    h/o  . Pre-diabetes    she reports that she never took meds. for prediabetes, states MD has told her that she has corrected her situation with diet   . Seizures ([place])    "at one time; stopped in the 1990s" (10/15/2016)  . Stress at home    Pt. reports that she has a lot of personal stress & she is aware that it might being playing a part in her Migraine heaaches      Patient Active Problem List   Diagnosis Date Noted  . Pain of left hand 06/19/2019  . Closed fracture of phalanx of finger 12/05/2018  . Fracture of left radius 12/05/2018  . Encounter for removal of sutures 01/15/2018  . Drug rash 11/15/2017  . Altered mental status 11/08/2017  .  Acute encephalopathy 11/07/2017  . Seizure disorder ([place]) 11/07/2017  . Dementia ([place]) 11/07/2017  . Essential hypertension 11/07/2017  . Headaches, cluster 11/07/2017  . AKI (acute kidney injury) ([place]) 11/07/2017  . Polypharmacy 05/15/2017  . Intractable chronic cluster headache 05/01/2017  . Memory change 07/19/2016  . Meningioma ([place]) 01/09/2016  . Depression 11/08/2015  . Abnormal head movements 11/08/2015  . Somnolence, daytime 07/14/2015  . Incisional hernia 06/16/2015  . Intractable chronic migraine without aura and without status migrainosus 09/10/2014  . 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 Chronic headaches 04/01/2013    Past Surgical History:  Procedure Laterality Date  . EYE SURGERY Left 2018   left eye cataract removed  . HERNIA REPAIR    . INCISIONAL HERNIA REPAIR N/A 06/16/2015   Procedure: LAPAROSCOPIC INCISIONAL HERNIA WITH MESH;  Surgeon: Coralie Keens, MD;  Location: [facility];  Service: General;  Laterality: N/A;  . [place]  10/15/2016  . INCISIONAL HERNIA REPAIR N/A 10/15/2016   Procedure: OPEN INCISIONAL HERNIA REPAIR WITH MESH;  Surgeon: Coralie Keens, MD;  Location: [facility];  Service: General;  Laterality: N/A;  . INCISIONAL HERNIA REPAIR Left 11/05/2018   open; w/mesh  . INGUINAL HERNIA REPAIR Left 11/05/2018   Procedure: OPEN REPAIR LEFT HERNIA ERAS PATHWAY;  Surgeon: [name],  Nathaneil Canary, MD;  Location: [facility];  Service: General;  Laterality: Left;  TAP BLOCK  . INSERTION OF MESH N/A 06/16/2015   Procedure: INSERTION OF MESH;  Surgeon: Coralie Keens, MD;  Location: [facility];  Service: General;  Laterality: N/A;  . VAGINAL HYSTERECTOMY  ~ 1992     OB History   No obstetric history on file.     Family History  Problem Relation Age of Onset  . Lung cancer Mother   . Kidney failure Father   . Hypertension Other     Social History   Tobacco Use  . Smoking status: Never Smoker  . Smokeless tobacco: Never Used  Substance Use Topics   . Alcohol use: No    Alcohol/week: 0.0 standard drinks  . Drug use: No    Home Medications Prior to Admission medications   Medication Sig Start Date End Date Taking? Authorizing Provider  amLODipine (NORVASC) 5 MG tablet Take 5 mg by mouth daily.   Yes [provider]  atorvastatin (LIPITOR) 80 MG tablet Take 80 mg by mouth daily.   Yes [provider]  divalproex (DEPAKOTE ER) 500 MG 24 hr tablet TAKE 1 TABLET BY MOUTH EVERY MORNING AND TAKE 2 TABLETS BY MOUTH EVERY EVENING Patient taking differently: Take 500-1,000 mg by mouth See admin instructions. Take 1 tablet (500 mg) by mouth every morning and 2 tablets (1000 mg) at night 10/11/16  Yes Marcial Pacas, MD  Erenumab-aooe (AIMOVIG, 140 MG DOSE, [place]) Inject 140 mg into the skin every 30 (thirty) days. For migraine prevention   Yes [provider]  esomeprazole (NEXIUM) 40 MG capsule Take 40 mg by mouth daily.    Yes [provider]  ferrous sulfate 325 (65 FE) MG tablet Take 325 mg by mouth daily with breakfast.   Yes [provider]  metFORMIN (GLUCOPHAGE-XR) 500 MG 24 hr tablet Take 500 mg by mouth daily with breakfast.   Yes [provider]  Multiple Vitamin (MULTIVITAMIN WITH MINERALS) TABS Take 1 tablet by mouth daily.   Yes [provider]  SUMAtriptan-naproxen (TREXIMET) 85-500 MG tablet Take 1 tablet by mouth 2 (two) times daily as needed for migraine. Max 2 tab per 24 hours, 1-2 days per week 10/07/18  Yes [provider]  topiramate (TOPAMAX) 100 MG tablet Take 200 mg by mouth daily.  07/08/14  Yes [provider]  Ubrogepant (UBRELVY) 100 MG TABS Take 100 mg by mouth daily as needed (migraines).   Yes [provider]    Allergies    Aspirin, Nsaids, and Tolmetin  Review of Systems   Review of Systems  All other systems reviewed and are negative except that which was mentioned in HPI  Physical Exam Updated Vital Signs BP (!) 144/77 (BP  Location: Right Arm)   Pulse 67   Temp 98.3 F (36.8 C) (Oral)   Resp 16   SpO2 97%   Physical Exam Vitals and nursing note reviewed.  Constitutional:      General: She is not in acute distress.    Appearance: She is well-developed.     Comments: Awake, alert, uncomfortable, sitting in dark room  HENT:     Head: Normocephalic and atraumatic.  Eyes:     Extraocular Movements: Extraocular movements intact.     Conjunctiva/sclera: Conjunctivae normal.     Pupils: Pupils are equal, round, and reactive to light.  Cardiovascular:     Rate and Rhythm: Normal rate and regular rhythm.  Heart sounds: Normal heart sounds. No murmur.  Pulmonary:     Effort: Pulmonary effort is normal. No respiratory distress.     Breath sounds: Normal breath sounds.  Abdominal:     General: Bowel sounds are normal. There is no distension.     Palpations: Abdomen is soft.     Tenderness: There is no abdominal tenderness.  Musculoskeletal:     Cervical back: Neck supple.  Skin:    General: Skin is warm and dry.  Neurological:     Mental Status: She is alert and oriented to person, place, and time.     Cranial Nerves: No cranial nerve deficit.     Motor: No abnormal muscle tone.     Deep Tendon Reflexes: Reflexes are normal and symmetric.     Comments: Fluent speech, normal finger-to-nose testing, negative pronator drift, no clonus 5/5 strength and normal sensation x all 4 extremities  Psychiatric:        Behavior: Behavior normal.        Thought Content: Thought content normal.        Judgment: Judgment normal.     ED Results / Procedures / Treatments   Labs (all labs ordered are listed, but only abnormal results are displayed) Labs Reviewed - No data to display  EKG None  Radiology No results found.  Procedures Procedures (including critical care time)  Medications Ordered in ED Medications  sodium chloride 0.9 % bolus 1,000 mL (0 mLs Intravenous Stopped 01/23/20 1926)  magnesium  sulfate IVPB 2 g 50 mL (0 g Intravenous Stopped 01/23/20 1926)  diphenhydrAMINE (BENADRYL) injection 25 mg (25 mg Intravenous Given 01/23/20 1803)  prochlorperazine (COMPAZINE) injection 10 mg (10 mg Intravenous Given 01/23/20 1803)    ED Course  I have reviewed the triage vital signs and the nursing notes.  Pertinent labs & imaging results that were available during my care of the patient were reviewed by me and considered in my medical decision making (see chart for details).    MDM Rules/Calculators/A&P                      Pt neuro intact on exam, VS reassuring. No infectious sx to suggest meningitis/encephalitis, COVID-19. No sudden onset of severe headache or abnormal neuro features to suggest intracranial bleed or mass. I do not feel she needs head imaging at this time. Pt w/ extensive migraine hx w/ multiple medications at home, follows w/ neurology. I reviewed most recent clinic visit in Sept 2020. Gave above migraine cocktail. On reassessment, pt's pain improved to 1/10 and she felt comfortable going home. I discussed supportive measures, f/u with her neurologist, and return precautions. Instructed to continue all home meds. Final Clinical Impression(s) / ED Diagnoses Final diagnoses:  Other migraine with status migrainosus, not intractable    Rx / DC Orders ED Discharge Orders    None       Anabel Lykins, Wenda Overland, MD 01/23/20 1948

## 2020-01-28 DIAGNOSIS — G44021 Chronic cluster headache, intractable: Secondary | ICD-10-CM | POA: Diagnosis not present

## 2020-02-05 ENCOUNTER — Ambulatory Visit: Payer: Medicare Other | Attending: Internal Medicine

## 2020-02-05 DIAGNOSIS — Z23 Encounter for immunization: Secondary | ICD-10-CM | POA: Insufficient documentation

## 2020-02-05 NOTE — Progress Notes (Signed)
   Covid-19 Vaccination Clinic  Name:  [name]    MRN: [id number] DOB: 01/15/1951  02/05/2020  [name] was observed post Covid-19 immunization for 15 minutes without incidence. She was provided with Vaccine Information Sheet and instruction to access the V-Safe system.   [name] was instructed to call 911 with any severe reactions post vaccine: Marland Kitchen Difficulty breathing  . Swelling of your face and throat  . A fast heartbeat  . A bad rash all over your body  . Dizziness and weakness    Immunizations Administered    Name Date Dose VIS Date Route   Pfizer COVID-19 Vaccine 02/05/2020 10:18 AM 0.3 mL 12/04/2019 Intramuscular   Manufacturer: [place]   Lot: [id number]   [place]: [id number]

## 2020-02-15 ENCOUNTER — Ambulatory Visit: Payer: Managed Care, Other (non HMO)

## 2020-02-22 ENCOUNTER — Other Ambulatory Visit: Payer: Self-pay | Admitting: *Deleted

## 2020-02-22 NOTE — Patient Outreach (Signed)
[facility]) Care Management  02/22/2020  [name] 01-11-1951 [id number]   [place] Quarterly Outreach  Referral Date:10/27/2019 Referral Source:EMMI Prevent Reason for Referral:Screening Insurance:AetnaMedicare   Outreach Attempt:  Outreach attempt #1 to patient for follow up. No answer and unable to leave voicemail message due to voicemail not engaging.  Plan:  RN Health Coach will make another outreach attempt to patient within the month of April if no return call back from patient.   [place] [phone] [name]

## 2020-03-02 DIAGNOSIS — G44021 Chronic cluster headache, intractable: Secondary | ICD-10-CM | POA: Diagnosis not present

## 2020-03-14 DIAGNOSIS — D329 Benign neoplasm of meninges, unspecified: Secondary | ICD-10-CM | POA: Diagnosis not present

## 2020-03-14 DIAGNOSIS — R413 Other amnesia: Secondary | ICD-10-CM | POA: Diagnosis not present

## 2020-03-15 ENCOUNTER — Other Ambulatory Visit: Payer: Self-pay | Admitting: Family Medicine

## 2020-03-15 DIAGNOSIS — R413 Other amnesia: Secondary | ICD-10-CM

## 2020-04-07 ENCOUNTER — Other Ambulatory Visit: Payer: Self-pay

## 2020-04-07 ENCOUNTER — Ambulatory Visit
Admission: RE | Admit: 2020-04-07 | Discharge: 2020-04-07 | Disposition: A | Discharge status: Home / Self Care | Payer: Medicare Other | Source: Ambulatory Visit | Attending: Family Medicine | Admitting: Family Medicine

## 2020-04-07 DIAGNOSIS — R413 Other amnesia: Secondary | ICD-10-CM

## 2020-04-07 DIAGNOSIS — D329 Benign neoplasm of meninges, unspecified: Secondary | ICD-10-CM | POA: Diagnosis not present

## 2020-04-08 ENCOUNTER — Encounter: Payer: Self-pay | Admitting: *Deleted

## 2020-04-08 ENCOUNTER — Other Ambulatory Visit: Payer: Self-pay | Admitting: *Deleted

## 2020-04-08 NOTE — Patient Outreach (Signed)
[facility]) Care Management  [place]  04/08/2020   [name] 18-Oct-1951 [id number]   [place] Quarterly Outreach   Referral Date:  10/27/2019 Referral Source:  EMMI Prevent Reason for Referral:  Screening Insurance:  Aetna Medicare   Outreach Attempt:  Successful telephone outreach to patient for follow up.  HIPAA verified with patient.  Patient reporting she continues to have migraines that medication is helping on occasion.  Reports having taking a prednisone taper given by her neurologist.  Patient reporting blood sugars were elevated while taking the prednisone in the 130's, but have trended down since completion of the taper.  Fasting blood sugar this morning was 85 with recent fasting ranges of 80's.  Denies any hypoglycemic events.  States she has been having memory issues and is awaiting MRI report from her primary care provider.  Encounter Medications:  Outpatient Encounter Medications as of 04/08/2020  Medication Sig Note  . amLODipine (NORVASC) 5 MG tablet Take 5 mg by mouth daily.   Marland Kitchen atorvastatin (LIPITOR) 80 MG tablet Take 80 mg by mouth daily.   . divalproex (DEPAKOTE ER) 500 MG 24 hr tablet TAKE 1 TABLET BY MOUTH EVERY MORNING AND TAKE 2 TABLETS BY MOUTH EVERY EVENING (Patient taking differently: Take 500-1,000 mg by mouth See admin instructions. Take 1 tablet (500 mg) by mouth every morning and 2 tablets (1000 mg) at night) 01/23/2020: [facility] states [facility] June [facility]  (#270[facility] - pt states she is still taking 3 daily and has some at home  . Erenumab-aooe (AIMOVIG, 140 MG DOSE, [place]) Inject 140 mg into the skin every 30 (thirty) days. For migraine prevention   . esomeprazole (NEXIUM) 40 MG capsule Take 40 mg by mouth daily.    . metFORMIN (GLUCOPHAGE-XR) 500 MG 24 hr tablet Take 500 mg by mouth daily with breakfast.   . Multiple Vitamin (MULTIVITAMIN WITH MINERALS) TABS Take 1 tablet by mouth daily.   . SUMAtriptan-naproxen (TREXIMET)  85-500 MG tablet Take 1 tablet by mouth 2 (two) times daily as needed for migraine. Max 2 tab per 24 hours, 1-2 days per week   . topiramate (TOPAMAX) 100 MG tablet Take 200 mg by mouth daily.    Marland Kitchen Ubrogepant (UBRELVY) 100 MG TABS Take 100 mg by mouth daily as needed (migraines).   . ferrous sulfate 325 (65 FE) MG tablet Take 325 mg by mouth daily with breakfast. 04/08/2020: Reports no longer taking   No facility-administered encounter medications on file as of 04/08/2020.    Functional Status:  In your present state of health, do you have any difficulty performing the following activities: 11/26/2019  Hearing? N  Vision? N  Difficulty concentrating or making decisions? N  Walking or climbing stairs? N  Dressing or bathing? N  Doing errands, shopping? N  Preparing Food and eating ? N  Using the Toilet? N  In the past six months, have you accidently leaked urine? Y  Comment some urine incontinence  Do you have problems with loss of bowel control? N  Managing your Medications? N  Managing your Finances? N  Housekeeping or managing your Housekeeping? N  Some recent data might be hidden    Fall/Depression Screening: Fall Risk  04/08/2020 11/26/2019 10/28/2019  Falls in the past year? 1 1 1   Comment - fall in October 2020 fell in October 2020  Number falls in past yr: 1 0 0  Injury with Fall? 0 0 0  Comment - - hit  head but no injury or loss of consciousness  Risk for fall due to : History of fall(s);Medication side effect;Impaired vision History of fall(s);Medication side effect;Impaired vision History of fall(s);Medication side effect  Follow up Falls evaluation completed;Education provided;Falls prevention discussed Falls evaluation completed;Education provided;Falls prevention discussed Falls evaluation completed;Education provided;Falls prevention discussed   PHQ 2/9 Scores 11/26/2019 10/28/2019 06/02/2014  PHQ - 2 Score 0 0 0   THN CM Care Plan Problem One     Most Recent Value   Care Plan Problem One  Knowledge deficiet related to self care management of diabetes and medication compliance.  Role Documenting the Problem One  [place] for Problem One  Active  THN Long Term Goal   Patient will maintain Hgb A1C of below 7 within the next 90 days.  THN Long Term Goal Start Date  04/08/20  Interventions for Problem One Long Term Goal  Goals and care plan reviewed and discussed with patient, reviewed current blood sugar ranges with patient and congratulated on keeping within goal, discussed and encouraged heart healthy diabetic diet, reviewed medications and encouraged medication compliance, encouraged to contact primary care for follow up after MRI, encouraged to continue to monitor blood sugars daily and to keep log for provider review, encouraged to continue to speak with neurologist concerning migraines     Appointments:  Attended appointment with primary care provider, Dr. Tamala Julian about a week ago per patient and is following up soon to get testing results.  Plan: RN Health Coach will send primary care provider quarterly update. RN Health Coach will make next telephone outreach to patient within the month of July and patient agrees to future outreach.  [place] [phone] [name]

## 2020-04-26 ENCOUNTER — Other Ambulatory Visit: Payer: Self-pay | Admitting: Radiation Therapy

## 2020-04-26 ENCOUNTER — Telehealth: Payer: Self-pay | Admitting: Internal Medicine

## 2020-04-26 NOTE — Telephone Encounter (Signed)
Received an urgent referral from Dr. Carol Ada for increasing size of menangiomas. Pt has been cld and scheduled to see Dr. Mickeal Skinner on 5/11 at 10am. She's aware to arrive 15 minutes early.

## 2020-05-03 ENCOUNTER — Inpatient Hospital Stay: Payer: Medicare Other | Attending: Internal Medicine | Admitting: Internal Medicine

## 2020-05-03 ENCOUNTER — Other Ambulatory Visit: Payer: Self-pay

## 2020-05-03 VITALS — BP 178/68 | HR 79 | Temp 99.1°F | Resp 18 | Ht 60.0 in | Wt 107.7 lb

## 2020-05-03 DIAGNOSIS — Z801 Family history of malignant neoplasm of trachea, bronchus and lung: Secondary | ICD-10-CM | POA: Diagnosis not present

## 2020-05-03 DIAGNOSIS — G9349 Other encephalopathy: Secondary | ICD-10-CM | POA: Diagnosis not present

## 2020-05-03 DIAGNOSIS — Z841 Family history of disorders of kidney and ureter: Secondary | ICD-10-CM | POA: Insufficient documentation

## 2020-05-03 DIAGNOSIS — G43909 Migraine, unspecified, not intractable, without status migrainosus: Secondary | ICD-10-CM | POA: Diagnosis not present

## 2020-05-03 DIAGNOSIS — Z886 Allergy status to analgesic agent status: Secondary | ICD-10-CM | POA: Insufficient documentation

## 2020-05-03 DIAGNOSIS — D320 Benign neoplasm of cerebral meninges: Secondary | ICD-10-CM

## 2020-05-03 DIAGNOSIS — Z79899 Other long term (current) drug therapy: Secondary | ICD-10-CM

## 2020-05-03 DIAGNOSIS — F0390 Unspecified dementia without behavioral disturbance: Secondary | ICD-10-CM | POA: Insufficient documentation

## 2020-05-03 DIAGNOSIS — Z8249 Family history of ischemic heart disease and other diseases of the circulatory system: Secondary | ICD-10-CM | POA: Insufficient documentation

## 2020-05-03 DIAGNOSIS — D329 Benign neoplasm of meninges, unspecified: Secondary | ICD-10-CM

## 2020-05-03 NOTE — Progress Notes (Signed)
[facility] [place], [place] [address] [phone]   New Patient Evaluation  Date of Service: 05/03/20 Patient Name: [name] Patient MRN: [id number] Patient DOB: 1951/03/22 Provider: Ventura Sellers, MD  Identifying Statement:  [name] is a 69 y.o. female with left frontal meningioma who presents for initial consultation and evaluation.    Referring Provider: Carol Ada, [place],  [place] [id number]  History of Present Illness: The patient's records from the referring physician were obtained and reviewed and the patient interviewed to confirm this HPI.  [name] presents to clinic for review and evaluation regarding recent changes on MRI brain.  Imaging was obtained by primary care provider after noting decline in cognitive function.  Patient and husband acknowledge she has "slowed down" in recent years.  Still struggling greatly with daily migraine headaches despite several prophylactic agents and breakthrough medications.    Medications: Current Outpatient Medications on File Prior to Visit  Medication Sig Dispense Refill  . amLODipine (NORVASC) 5 MG tablet Take 5 mg by mouth daily.    Marland Kitchen atorvastatin (LIPITOR) 80 MG tablet Take 80 mg by mouth daily.    . divalproex (DEPAKOTE ER) 500 MG 24 hr tablet TAKE 1 TABLET BY MOUTH EVERY MORNING AND TAKE 2 TABLETS BY MOUTH EVERY EVENING (Patient taking differently: Take 500-1,000 mg by mouth See admin instructions. Take 1 tablet (500 mg) by mouth every morning and 2 tablets (1000 mg) at night) 270 tablet 3  . Erenumab-aooe (AIMOVIG, 140 MG DOSE, [place]) Inject 140 mg into the skin every 30 (thirty) days. For migraine prevention    . metFORMIN (GLUCOPHAGE-XR) 500 MG 24 hr tablet Take 500 mg by mouth daily with breakfast.    . Multiple Vitamin (MULTIVITAMIN WITH MINERALS) TABS Take 1 tablet by mouth daily.    . SUMAtriptan-naproxen (TREXIMET)  85-500 MG tablet Take 1 tablet by mouth 2 (two) times daily as needed for migraine. Max 2 tab per 24 hours, 1-2 days per week  3  . topiramate (TOPAMAX) 100 MG tablet Take 200 mg by mouth daily.     Marland Kitchen Ubrogepant (UBRELVY) 100 MG TABS Take 100 mg by mouth daily as needed (migraines).    . ferrous sulfate 325 (65 FE) MG tablet Take 325 mg by mouth daily with breakfast.     No current facility-administered medications on file prior to visit.    Allergies:  Allergies  Allergen Reactions  . Aspirin Nausea Only and Other (See Comments)    ulcers   . Nsaids Other (See Comments)    Can cause ulcers  . Tolmetin Other (See Comments)    Can cause ulcers   Past Medical History:  Past Medical History:  Diagnosis Date  . Cataract   . Dementia ([place])   . Depression    denies  . Displaced fracture    left ring finger and small finger proximal phalanx  . Epilepsy, grand mal ([place])    "last seizure ~ 2001" (06/16/2015)  . High cholesterol   . Hypertension   . Migraine    "just about qd";  treated by Dr. [name] (10/15/2016)  . Migraines   . Pneumonia    h/o  . Pre-diabetes    she reports that she never took meds. for prediabetes, states MD has told her that she has corrected her situation with diet   . Seizures ([place])    "at  one time; stopped in the 1990s" (10/15/2016)  . Stress at home    Pt. reports that she has a lot of personal stress & she is aware that it might being playing a part in her Migraine heaaches     Past Surgical History:  Past Surgical History:  Procedure Laterality Date  . EYE SURGERY Left 2018   left eye cataract removed  . HERNIA REPAIR    . INCISIONAL HERNIA REPAIR N/A 06/16/2015   Procedure: LAPAROSCOPIC INCISIONAL HERNIA WITH MESH;  Surgeon: Coralie Keens, MD;  Location: [facility];  Service: General;  Laterality: N/A;  . [place]  10/15/2016  . INCISIONAL HERNIA REPAIR N/A 10/15/2016   Procedure: OPEN INCISIONAL HERNIA REPAIR WITH MESH;  Surgeon:  Coralie Keens, MD;  Location: [facility];  Service: General;  Laterality: N/A;  . INCISIONAL HERNIA REPAIR Left 11/05/2018   open; w/mesh  . INGUINAL HERNIA REPAIR Left 11/05/2018   Procedure: OPEN REPAIR LEFT HERNIA ERAS PATHWAY;  Surgeon: Coralie Keens, MD;  Location: [facility];  Service: General;  Laterality: Left;  TAP BLOCK  . INSERTION OF MESH N/A 06/16/2015   Procedure: INSERTION OF MESH;  Surgeon: Coralie Keens, MD;  Location: [facility];  Service: General;  Laterality: N/A;  . VAGINAL HYSTERECTOMY  ~ 1992   Social History:  Social History   Socioeconomic History  . Marital status: Married    Spouse name: Elenore Rota  . Number of children: 3  . Years of education: college  . Highest education level: Not on file  Occupational History    Comment: does not work  Tobacco Use  . Smoking status: Never Smoker  . Smokeless tobacco: Never Used  Substance and Sexual Activity  . Alcohol use: No    Alcohol/week: 0.0 standard drinks  . Drug use: No  . Sexual activity: Yes  Other Topics Concern  . Not on file  Social History Narrative   Patient lives at home with her husband Elenore Rota). Patient was a homemaker.   Right handed.   College education.   Caffeine- One cup of coffee daily.   Patient has three children.   Social Determinants of Health   Financial Resource Strain: Low Risk   . Difficulty of Paying Living Expenses: Not hard at all  Food Insecurity: No Food Insecurity  . Worried About Charity fundraiser in the Last Year: Never true  . Ran Out of Food in the Last Year: Never true  Transportation Needs: No Transportation Needs  . Lack of Transportation (Medical): No  . Lack of Transportation (Non-Medical): No  Physical Activity: Insufficiently Active  . Days of Exercise per Week: 3 days  . Minutes of Exercise per Session: 30 min  Stress: Stress Concern Present  . Feeling of Stress : To some extent  Social Connections:   . Frequency of Communication with Friends and Family:    . Frequency of Social Gatherings with Friends and Family:   . Attends Religious Services:   . Active Member of Clubs or Organizations:   . Attends Archivist Meetings:   Marland Kitchen Marital Status:   Intimate Partner Violence: Not At Risk  . Fear of Current or Ex-Partner: No  . Emotionally Abused: No  . Physically Abused: No  . Sexually Abused: No   Family History:  Family History  Problem Relation Age of Onset  . Lung cancer Mother   . Kidney failure Father   . Hypertension Other     Review of Systems: Constitutional:  Doesn't report fevers, chills or abnormal weight loss Eyes: Doesn't report blurriness of vision Ears, nose, mouth, throat, and face: Doesn't report sore throat Respiratory: Doesn't report cough, dyspnea or wheezes Cardiovascular: Doesn't report palpitation, chest discomfort  Gastrointestinal:  Doesn't report nausea, constipation, diarrhea GU: Doesn't report incontinence Skin: Doesn't report skin rashes Neurological: Per HPI Musculoskeletal: Doesn't report joint pain Behavioral/Psych: Doesn't report anxiety  Physical Exam: Vitals:   05/03/20 1004  BP: (!) 178/68  Pulse: 79  Resp: 18  Temp: 99.1 F (37.3 C)  SpO2: 100%   KPS: 90. General: Alert, cooperative, pleasant, in no acute distress Head: Normal EENT: No conjunctival injection or scleral icterus.  Lungs: Resp effort normal Cardiac: Regular rate Abdomen: Non-distended abdomen Skin: No rashes cyanosis or petechiae. Extremities: No clubbing or edema  Neurologic Exam: Mental Status: Awake, alert, attentive to examiner. Oriented to self and environment. Language is fluent with intact comprehension.  Cranial Nerves: Visual acuity is grossly normal. Visual fields are full. Extra-ocular movements intact. No ptosis. Face is symmetric Motor: Tone and bulk are normal. Power is full in both arms and legs. Reflexes are symmetric, no pathologic reflexes present.  Sensory: Intact to light touch Gait:  Normal.   Labs: I have reviewed the data as listed    Component Value Date/Time   NA 142 11/21/2019 1549   K 3.8 11/21/2019 1549   CL 111 11/21/2019 1549   CO2 23 11/21/2019 1549   GLUCOSE 85 11/21/2019 1549   BUN 12 11/21/2019 1549   CREATININE 0.81 11/21/2019 1549   CALCIUM 8.9 11/21/2019 1549   PROT 7.4 08/04/2018 1645   ALBUMIN 4.1 08/04/2018 1645   AST 31 08/04/2018 1645   ALT 25 08/04/2018 1645   ALKPHOS 53 08/04/2018 1645   BILITOT 0.5 08/04/2018 1645   GFRNONAA >60 11/21/2019 1549   GFRAA >60 11/21/2019 1549   Lab Results  Component Value Date   WBC 9.3 11/21/2019   NEUTROABS 4.9 11/21/2019   HGB 11.5 (L) 11/21/2019   HCT 37.6 11/21/2019   MCV 97.2 11/21/2019   PLT 213 11/21/2019    Imaging: [place] Clinician Interpretation: I have personally reviewed the CNS images as listed.  My interpretation, in the context of the patient's clinical presentation, is progressive disease  MR BRAIN WO CONTRAST  Result Date: 04/08/2020 CLINICAL DATA:  Worsening short-term memory, history of meningiomas EXAM: MRI HEAD WITHOUT CONTRAST TECHNIQUE: Multiplanar, multiecho pulse sequences of the brain and surrounding structures were obtained without intravenous contrast. COMPARISON:  11/08/2017 FINDINGS: Brain: Left anterior cranial fossa meningioma has slightly increased in size measuring 13 x 7 mm (previously 11 x 7 mm). Additional left frontal convexity meningioma is also likely increased in size measuring up to 13 mm (previously 10 mm). No underlying parenchymal edema. There is no new mass, mass effect, or edema. There is no acute infarction or intracranial hemorrhage. There is no hydrocephalus or extra-axial fluid collection. Prominence of the ventricles and sulci reflects cerebral and cerebellar volume loss, which is stable to mildly increased. Vascular: Major vessel flow voids at the skull base are preserved. Skull and upper cervical spine: Normal marrow signal is preserved.  Sinuses/Orbits: Paranasal sinuses are aerated. Left lens replacement. Other: Sella is partially empty.  Mastoid air cells are clear. IMPRESSION: Slight increase in size of two small meningiomas since 2018. No parenchymal edema. Stable to mild increase in cerebral and cerebellar volume loss since 2018. There is no substantial white matter disease. Electronically Signed   By: Macy Mis  M.D.   On: 04/08/2020 11:06    Pathology:  Assessment/Plan Meningioma [facility]) [D32.9]  We appreciate the opportunity to participate in the care of [name].  She is clinically stable, with mild static encephalopathy which may be partially related to burden of medications such as Topiramate and Depakote.  Her meningiomas are slightly increased, but are not clinically symptomatic and overall remain small in size.  We reviewed her case in brain/spine tumor board this week, she is not (yet) good candidate for surgery.  Can continue to follow along radiographically.  We ask that [name] return to clinic in 12 months following next brain MRI, or sooner as needed.  Will con't to follow with her neurologist for headache management.  Screening for potential clinical trials was performed and discussed using eligibility criteria for active protocols at [facility], loco-regional tertiary centers, as well as national database available on [email or web address].    The patient is not a candidate for a research protocol at this time due to no suitable study identified.   We spent twenty additional minutes teaching regarding the natural history, biology, and historical experience in the treatment of brain tumors. We then discussed in detail the current recommendations for therapy focusing on the mode of administration, mechanism of action, anticipated toxicities, and quality of life issues associated with this plan. We also provided teaching sheets for the patient to take home as an additional resource.  All  questions were answered. The patient knows to call the clinic with any problems, questions or concerns. No barriers to learning were detected.  The total time spent in the encounter was 45 minutes and more than 50% was on counseling and review of test results   Ventura Sellers, MD Medical Director of Neuro-Oncology [facility] at [place] 05/03/20 3:02 PM

## 2020-05-04 ENCOUNTER — Telehealth: Payer: Self-pay | Admitting: Internal Medicine

## 2020-05-04 NOTE — Telephone Encounter (Signed)
Scheduled appt per 5/11 los.  Spoke with pt and they are aware of their scheduled appt date and time.

## 2020-05-13 DIAGNOSIS — Z7984 Long term (current) use of oral hypoglycemic drugs: Secondary | ICD-10-CM | POA: Diagnosis not present

## 2020-05-13 DIAGNOSIS — M7061 Trochanteric bursitis, right hip: Secondary | ICD-10-CM | POA: Diagnosis not present

## 2020-05-13 DIAGNOSIS — M79672 Pain in left foot: Secondary | ICD-10-CM | POA: Diagnosis not present

## 2020-05-13 DIAGNOSIS — M79671 Pain in right foot: Secondary | ICD-10-CM | POA: Diagnosis not present

## 2020-05-13 DIAGNOSIS — E1169 Type 2 diabetes mellitus with other specified complication: Secondary | ICD-10-CM | POA: Diagnosis not present

## 2020-05-13 DIAGNOSIS — M7062 Trochanteric bursitis, left hip: Secondary | ICD-10-CM | POA: Diagnosis not present

## 2020-05-17 DIAGNOSIS — M79642 Pain in left hand: Secondary | ICD-10-CM | POA: Diagnosis not present

## 2020-05-26 DIAGNOSIS — M25551 Pain in right hip: Secondary | ICD-10-CM | POA: Insufficient documentation

## 2020-05-30 DIAGNOSIS — M7061 Trochanteric bursitis, right hip: Secondary | ICD-10-CM | POA: Diagnosis not present

## 2020-05-30 DIAGNOSIS — M25552 Pain in left hip: Secondary | ICD-10-CM | POA: Diagnosis not present

## 2020-05-30 DIAGNOSIS — M25551 Pain in right hip: Secondary | ICD-10-CM | POA: Diagnosis not present

## 2020-05-30 DIAGNOSIS — M7062 Trochanteric bursitis, left hip: Secondary | ICD-10-CM | POA: Diagnosis not present

## 2020-05-30 DIAGNOSIS — R52 Pain, unspecified: Secondary | ICD-10-CM | POA: Diagnosis not present

## 2020-06-01 DIAGNOSIS — E1169 Type 2 diabetes mellitus with other specified complication: Secondary | ICD-10-CM | POA: Diagnosis not present

## 2020-06-01 DIAGNOSIS — M79674 Pain in right toe(s): Secondary | ICD-10-CM | POA: Diagnosis not present

## 2020-06-01 DIAGNOSIS — M79675 Pain in left toe(s): Secondary | ICD-10-CM | POA: Diagnosis not present

## 2020-06-07 DIAGNOSIS — M24542 Contracture, left hand: Secondary | ICD-10-CM | POA: Diagnosis not present

## 2020-06-09 ENCOUNTER — Ambulatory Visit (INDEPENDENT_AMBULATORY_CARE_PROVIDER_SITE_OTHER): Payer: Medicare Other

## 2020-06-09 ENCOUNTER — Other Ambulatory Visit: Payer: Self-pay

## 2020-06-09 ENCOUNTER — Ambulatory Visit (INDEPENDENT_AMBULATORY_CARE_PROVIDER_SITE_OTHER): Payer: Medicare Other | Admitting: Podiatry

## 2020-06-09 ENCOUNTER — Other Ambulatory Visit: Payer: Self-pay | Admitting: Podiatry

## 2020-06-09 ENCOUNTER — Encounter: Payer: Self-pay | Admitting: Podiatry

## 2020-06-09 VITALS — Temp 97.3°F

## 2020-06-09 DIAGNOSIS — M779 Enthesopathy, unspecified: Secondary | ICD-10-CM

## 2020-06-09 DIAGNOSIS — M79672 Pain in left foot: Secondary | ICD-10-CM

## 2020-06-09 DIAGNOSIS — D169 Benign neoplasm of bone and articular cartilage, unspecified: Secondary | ICD-10-CM

## 2020-06-09 DIAGNOSIS — M79671 Pain in right foot: Secondary | ICD-10-CM

## 2020-06-10 NOTE — Progress Notes (Signed)
Subjective:   Patient ID: [name], female   DOB: 69 y.o.   MRN: [id number]   HPI Patient presents with new problems concerned about pain in the big toes of both feet stating lesions seem deformed and she is having trouble walking at times.  Does not remember injury   ROS      Objective:  Physical Exam  Neurovascular status was found to be intact muscle strength was reduced as was range of motion.  Patient does have moderate dementia which is complicating factor as does her husband and has pain when I pressed on the medial side of the hallux with keratotic tissue formation and deformity of the digit     Assessment:  Deformity of the hallux creating pressure against the medial side with no breakdown but callus formation     Plan:  H&P reviewed condition with her and caregiver and discussed debridement which was accomplished today padding therapy shoe gear modifications and topical medicines as needed.  I do not recommend surgical intervention in this case unless symptoms were to get worse and patient is to do daily inspections  X-ray indicates hallux interphalangeus deformity with moderate spurring of the hallux but no indications of lysis or other pathology

## 2020-06-30 ENCOUNTER — Other Ambulatory Visit: Payer: Self-pay | Admitting: *Deleted

## 2020-06-30 DIAGNOSIS — F3341 Major depressive disorder, recurrent, in partial remission: Secondary | ICD-10-CM | POA: Diagnosis not present

## 2020-06-30 DIAGNOSIS — Z1331 Encounter for screening for depression: Secondary | ICD-10-CM | POA: Diagnosis not present

## 2020-06-30 DIAGNOSIS — E1169 Type 2 diabetes mellitus with other specified complication: Secondary | ICD-10-CM | POA: Diagnosis not present

## 2020-06-30 DIAGNOSIS — K219 Gastro-esophageal reflux disease without esophagitis: Secondary | ICD-10-CM | POA: Diagnosis not present

## 2020-06-30 DIAGNOSIS — G43909 Migraine, unspecified, not intractable, without status migrainosus: Secondary | ICD-10-CM | POA: Diagnosis not present

## 2020-06-30 DIAGNOSIS — I10 Essential (primary) hypertension: Secondary | ICD-10-CM | POA: Diagnosis not present

## 2020-06-30 DIAGNOSIS — Z0000 Encounter for general adult medical examination without abnormal findings: Secondary | ICD-10-CM | POA: Diagnosis not present

## 2020-06-30 DIAGNOSIS — G40909 Epilepsy, unspecified, not intractable, without status epilepticus: Secondary | ICD-10-CM | POA: Diagnosis not present

## 2020-06-30 DIAGNOSIS — E785 Hyperlipidemia, unspecified: Secondary | ICD-10-CM | POA: Diagnosis not present

## 2020-06-30 DIAGNOSIS — Z7984 Long term (current) use of oral hypoglycemic drugs: Secondary | ICD-10-CM | POA: Diagnosis not present

## 2020-06-30 NOTE — Patient Outreach (Signed)
[facility]) Care Management  06/30/2020  ALLINE PIO 08-10-1951 [id number]   [place]  Referral Date:10/27/2019 Referral Source:EMMI Prevent Reason for Referral:Screening Insurance:AetnaMedicare   Outreach Attempt:  Outreach attempt #1 to patient for follow up. No answer and unable to leave voicemail message due to voicemail did not engage.  Plan:  RN Health Coach will make another outreach attempt within the month of August if no return call back from patient.   [place] [phone] [email or web address]

## 2020-07-08 DIAGNOSIS — M24542 Contracture, left hand: Secondary | ICD-10-CM | POA: Diagnosis not present

## 2020-07-08 DIAGNOSIS — G8918 Other acute postprocedural pain: Secondary | ICD-10-CM | POA: Diagnosis not present

## 2020-07-08 DIAGNOSIS — M20032 Swan-neck deformity of left finger(s): Secondary | ICD-10-CM | POA: Diagnosis not present

## 2020-07-08 DIAGNOSIS — M24242 Disorder of ligament, left hand: Secondary | ICD-10-CM | POA: Diagnosis not present

## 2020-07-14 DIAGNOSIS — M24542 Contracture, left hand: Secondary | ICD-10-CM | POA: Diagnosis not present

## 2020-07-14 DIAGNOSIS — M79645 Pain in left finger(s): Secondary | ICD-10-CM | POA: Diagnosis not present

## 2020-07-27 DIAGNOSIS — E119 Type 2 diabetes mellitus without complications: Secondary | ICD-10-CM | POA: Diagnosis not present

## 2020-08-04 DIAGNOSIS — M24542 Contracture, left hand: Secondary | ICD-10-CM | POA: Diagnosis not present

## 2020-08-04 DIAGNOSIS — M25642 Stiffness of left hand, not elsewhere classified: Secondary | ICD-10-CM | POA: Diagnosis not present

## 2020-08-04 DIAGNOSIS — M79645 Pain in left finger(s): Secondary | ICD-10-CM | POA: Diagnosis not present

## 2020-08-08 ENCOUNTER — Other Ambulatory Visit: Payer: Self-pay | Admitting: *Deleted

## 2020-08-08 NOTE — Patient Outreach (Signed)
[facility]) Care Management  08/08/2020  [name] [birth year] [id number]   [place]  Referral Date:10/27/2019 Referral Source:EMMI Prevent Reason for Referral:Screening Insurance:AetnaMedicare   Outreach Attempt:  Outreach attempt #2 to patient for follow up. No answer and unable to leave voicemail message due to voicemail not engaging.  Plan:  RN Health Coach will make another outreach attempt within the month of September if no return call back from patient.   [place] [phone] [email or web address]

## 2020-08-12 DIAGNOSIS — M24542 Contracture, left hand: Secondary | ICD-10-CM | POA: Diagnosis not present

## 2020-08-12 DIAGNOSIS — M79645 Pain in left finger(s): Secondary | ICD-10-CM | POA: Diagnosis not present

## 2020-08-12 DIAGNOSIS — M25642 Stiffness of left hand, not elsewhere classified: Secondary | ICD-10-CM | POA: Diagnosis not present

## 2020-08-15 DIAGNOSIS — R198 Other specified symptoms and signs involving the digestive system and abdomen: Secondary | ICD-10-CM | POA: Diagnosis not present

## 2020-08-16 DIAGNOSIS — S62615S Displaced fracture of proximal phalanx of left ring finger, sequela: Secondary | ICD-10-CM | POA: Diagnosis not present

## 2020-08-16 DIAGNOSIS — S62617S Displaced fracture of proximal phalanx of left little finger, sequela: Secondary | ICD-10-CM | POA: Diagnosis not present

## 2020-08-16 DIAGNOSIS — M79645 Pain in left finger(s): Secondary | ICD-10-CM | POA: Diagnosis not present

## 2020-08-16 DIAGNOSIS — M25642 Stiffness of left hand, not elsewhere classified: Secondary | ICD-10-CM | POA: Diagnosis not present

## 2020-08-16 DIAGNOSIS — M24542 Contracture, left hand: Secondary | ICD-10-CM | POA: Diagnosis not present

## 2020-08-18 DIAGNOSIS — M79645 Pain in left finger(s): Secondary | ICD-10-CM | POA: Diagnosis not present

## 2020-08-18 DIAGNOSIS — M25642 Stiffness of left hand, not elsewhere classified: Secondary | ICD-10-CM | POA: Diagnosis not present

## 2020-08-18 DIAGNOSIS — M24542 Contracture, left hand: Secondary | ICD-10-CM | POA: Diagnosis not present

## 2020-08-24 DIAGNOSIS — H524 Presbyopia: Secondary | ICD-10-CM | POA: Diagnosis not present

## 2020-08-24 DIAGNOSIS — E119 Type 2 diabetes mellitus without complications: Secondary | ICD-10-CM | POA: Diagnosis not present

## 2020-08-24 DIAGNOSIS — H2511 Age-related nuclear cataract, right eye: Secondary | ICD-10-CM | POA: Diagnosis not present

## 2020-08-24 DIAGNOSIS — H25011 Cortical age-related cataract, right eye: Secondary | ICD-10-CM | POA: Diagnosis not present

## 2020-08-25 DIAGNOSIS — M24542 Contracture, left hand: Secondary | ICD-10-CM | POA: Diagnosis not present

## 2020-08-25 DIAGNOSIS — M79645 Pain in left finger(s): Secondary | ICD-10-CM | POA: Diagnosis not present

## 2020-08-25 DIAGNOSIS — S62615S Displaced fracture of proximal phalanx of left ring finger, sequela: Secondary | ICD-10-CM | POA: Diagnosis not present

## 2020-08-25 DIAGNOSIS — S62617S Displaced fracture of proximal phalanx of left little finger, sequela: Secondary | ICD-10-CM | POA: Diagnosis not present

## 2020-08-25 DIAGNOSIS — M25642 Stiffness of left hand, not elsewhere classified: Secondary | ICD-10-CM | POA: Diagnosis not present

## 2020-09-02 DIAGNOSIS — S62615S Displaced fracture of proximal phalanx of left ring finger, sequela: Secondary | ICD-10-CM | POA: Diagnosis not present

## 2020-09-02 DIAGNOSIS — S62617S Displaced fracture of proximal phalanx of left little finger, sequela: Secondary | ICD-10-CM | POA: Diagnosis not present

## 2020-09-02 DIAGNOSIS — M24542 Contracture, left hand: Secondary | ICD-10-CM | POA: Diagnosis not present

## 2020-09-02 DIAGNOSIS — M25642 Stiffness of left hand, not elsewhere classified: Secondary | ICD-10-CM | POA: Diagnosis not present

## 2020-09-02 DIAGNOSIS — M79645 Pain in left finger(s): Secondary | ICD-10-CM | POA: Diagnosis not present

## 2020-09-06 DIAGNOSIS — S62615S Displaced fracture of proximal phalanx of left ring finger, sequela: Secondary | ICD-10-CM | POA: Diagnosis not present

## 2020-09-06 DIAGNOSIS — M25642 Stiffness of left hand, not elsewhere classified: Secondary | ICD-10-CM | POA: Diagnosis not present

## 2020-09-06 DIAGNOSIS — S62617S Displaced fracture of proximal phalanx of left little finger, sequela: Secondary | ICD-10-CM | POA: Diagnosis not present

## 2020-09-06 DIAGNOSIS — M79645 Pain in left finger(s): Secondary | ICD-10-CM | POA: Diagnosis not present

## 2020-09-06 DIAGNOSIS — M24542 Contracture, left hand: Secondary | ICD-10-CM | POA: Diagnosis not present

## 2020-09-08 DIAGNOSIS — M25642 Stiffness of left hand, not elsewhere classified: Secondary | ICD-10-CM | POA: Diagnosis not present

## 2020-09-08 DIAGNOSIS — S62615S Displaced fracture of proximal phalanx of left ring finger, sequela: Secondary | ICD-10-CM | POA: Diagnosis not present

## 2020-09-08 DIAGNOSIS — S62617S Displaced fracture of proximal phalanx of left little finger, sequela: Secondary | ICD-10-CM | POA: Diagnosis not present

## 2020-09-08 DIAGNOSIS — M79645 Pain in left finger(s): Secondary | ICD-10-CM | POA: Diagnosis not present

## 2020-09-08 DIAGNOSIS — M24542 Contracture, left hand: Secondary | ICD-10-CM | POA: Diagnosis not present

## 2020-09-12 ENCOUNTER — Encounter: Payer: Self-pay | Admitting: *Deleted

## 2020-09-12 ENCOUNTER — Other Ambulatory Visit: Payer: Self-pay | Admitting: *Deleted

## 2020-09-12 DIAGNOSIS — S62617S Displaced fracture of proximal phalanx of left little finger, sequela: Secondary | ICD-10-CM | POA: Diagnosis not present

## 2020-09-12 DIAGNOSIS — M79645 Pain in left finger(s): Secondary | ICD-10-CM | POA: Diagnosis not present

## 2020-09-12 DIAGNOSIS — M25642 Stiffness of left hand, not elsewhere classified: Secondary | ICD-10-CM | POA: Diagnosis not present

## 2020-09-12 DIAGNOSIS — S62615S Displaced fracture of proximal phalanx of left ring finger, sequela: Secondary | ICD-10-CM | POA: Diagnosis not present

## 2020-09-12 DIAGNOSIS — Z23 Encounter for immunization: Secondary | ICD-10-CM | POA: Diagnosis not present

## 2020-09-12 DIAGNOSIS — M24542 Contracture, left hand: Secondary | ICD-10-CM | POA: Diagnosis not present

## 2020-09-12 NOTE — Patient Outreach (Signed)
[facility]) Care Management  [place]  09/12/2020   LUDIA GARTLAND 06/29/1951 [id number]   [place] Quarterly Outreach   Referral Date:  10/27/2019 Referral Source:  EMMI Prevent Reason for Referral:  Screening Insurance:  Aetna Medicare   Outreach Attempt:  Successful telephone outreach to patient for follow up.  HIPAA verified with patient.  Patient reporting recent surgery on her fingers.  States she is healing well and attending outpatient therapy.  Patient reporting she is wearing the [facility] monitoring system.  Monitors blood sugars daily.  Fasting blood sugar this morning was 73 with fasting ranges of 70-100's.  Last Hgb A1C was down to 6.  Denies any recent falls, hypo or hyperglycemic events.  States her migraines have been better controlled.  Encounter Medications:  Outpatient Encounter Medications as of 09/12/2020  Medication Sig Note  . amLODipine (NORVASC) 5 MG tablet Take 5 mg by mouth daily.   Marland Kitchen atorvastatin (LIPITOR) 80 MG tablet Take 80 mg by mouth daily.   . calcium carbonate (OSCAL) 1500 (600 Ca) MG TABS tablet Take by mouth daily.   . diphenhydrAMINE (BENADRYL) 25 mg capsule Take 25 mg by mouth every 6 (six) hours as needed.   . divalproex (DEPAKOTE ER) 500 MG 24 hr tablet TAKE 1 TABLET BY MOUTH EVERY MORNING AND TAKE 2 TABLETS BY MOUTH EVERY EVENING (Patient taking differently: Take 500-1,000 mg by mouth See admin instructions. Take 1 tablet (500 mg) by mouth every morning and 2 tablets (1000 mg) at night) 01/23/2020: [facility] states [facility] June 2020  (#[facility]) - pt states she is still taking 3 daily and has some at home  . Erenumab-aooe (AIMOVIG, 140 MG DOSE, [place]) Inject 140 mg into the skin every 30 (thirty) days. For migraine prevention   . ezetimibe (ZETIA) 10 MG tablet Take 1 tablet by mouth daily.   . magnesium oxide (MAG-OX) 400 MG tablet Take 500 mg by mouth daily.   . metFORMIN (GLUCOPHAGE-XR) 500 MG 24 hr tablet Take 500  mg by mouth daily with breakfast.   . methocarbamol (ROBAXIN) 750 MG tablet  09/12/2020: Takes as needed  . Multiple Vitamin (MULTIVITAMIN WITH MINERALS) TABS Take 1 tablet by mouth daily.   Marland Kitchen oxyCODONE-acetaminophen (PERCOCET/ROXICET) 5-325 MG tablet Take by mouth.   . SUMAtriptan-naproxen (TREXIMET) 85-500 MG tablet Take 1 tablet by mouth 2 (two) times daily as needed for migraine. Max 2 tab per 24 hours, 1-2 days per week   . topiramate (TOPAMAX) 100 MG tablet Take 200 mg by mouth daily.    . traMADol (ULTRAM) 50 MG tablet Take by mouth. 09/12/2020: Takes as needed  . Ubrogepant (UBRELVY) 100 MG TABS Take 100 mg by mouth daily as needed (migraines).   . vitamin B-12 (CYANOCOBALAMIN) 100 MCG tablet Take 100 mcg by mouth daily.   Marland Kitchen zolpidem (AMBIEN) 5 MG tablet Take 5 mg by mouth at bedtime as needed for sleep.   . ferrous sulfate 325 (65 FE) MG tablet Take 325 mg by mouth daily with breakfast. (Patient not taking: Reported on 09/12/2020) 09/12/2020: Need to refill   No facility-administered encounter medications on file as of 09/12/2020.    Functional Status:  In your present state of health, do you have any difficulty performing the following activities: 11/26/2019  Hearing? N  Vision? N  Difficulty concentrating or making decisions? N  Walking or climbing stairs? N  Dressing or bathing? N  Doing errands, shopping? N  Conservation officer, nature and  eating ? N  Using the Toilet? N  In the past six months, have you accidently leaked urine? Y  Comment some urine incontinence  Do you have problems with loss of bowel control? N  Managing your Medications? N  Managing your Finances? N  Housekeeping or managing your Housekeeping? N  Some recent data might be hidden    Fall/Depression Screening: Fall Risk  09/12/2020 04/08/2020 11/26/2019  Falls in the past year? 0 1 1  Comment - - fall in October 2020  Number falls in past yr: 0 1 0  Injury with Fall? 0 0 0  Comment - - -  Risk for fall due to :  History of fall(s);Impaired vision;Medication side effect History of fall(s);Medication side effect;Impaired vision History of fall(s);Medication side effect;Impaired vision  Follow up Falls evaluation completed;Education provided;Falls prevention discussed Falls evaluation completed;Education provided;Falls prevention discussed Falls evaluation completed;Education provided;Falls prevention discussed   PHQ 2/9 Scores 11/26/2019 10/28/2019 06/02/2014  PHQ - 2 Score 0 0 0   Goals Addressed              This Visit's Progress   .  [address] [facility]) Patient will report maintaining Hgb A1C of 6.5 or below within the next 90 days. (pt-stated)        CARE PLAN ENTRY (see longitudinal plan of care for additional care plan information)  Objective:  Lab Results  Component Value Date   HGBA1C 5.3 10/29/2018 .   Lab Results  Component Value Date   CREATININE 0.81 11/21/2019   CREATININE 1.08 (H) 10/29/2018   CREATININE 1.16 (H) 08/04/2018 .   Marland Kitchen No results found for: EGFR  Current Barriers:  Marland Kitchen Knowledge Deficits related to basic Diabetes pathophysiology and self care/management  Case Manager Clinical Goal(s):  Over the next 90 days, patient will demonstrate improved adherence to prescribed treatment plan for diabetes self care/management as evidenced by: maintaining Hgb A1C of 6.5 or below . Verbalize daily monitoring and recording of CBG within 90 days . Verbalize adherence to ADA/ carb modified diet within the next 90 days . Verbalize adherence to prescribed medication regimen within the next 90 days   Interventions:  . Provided education to patient about basic DM disease process . Reviewed medications with patient and discussed importance of medication adherence . Discussed plans with patient for ongoing care management follow up and provided patient with direct contact information for care management team . Provided patient with written educational materials related to hypo and hyperglycemia and  importance of correct treatment . Reviewed scheduled/upcoming provider appointments including: continued outpatient therapy and primary care follow up . Advised patient, providing education and rationale, to check cbg at least daily and record, calling primary care provider for findings outside established parameters.   . Congratulated patient on current reduction of Hgb A1C of 6  Patient Self Care Activities:  . Self administers oral medications as prescribed . Attends all scheduled provider appointments . Checks blood sugars as prescribed and utilize hyper and hypoglycemia protocol as needed . Adheres to prescribed ADA/carb modified  Initial goal documentation       Appointments:  Attended appointment with primary care provider, Dr. Tamala Julian on 06/30/2020 and has follow up scheduled in January 2022.  Plan: RN Health Coach will send primary care quarterly update. RN Health Coach will make next telephone outreach to patient within the month of December.  [place] [phone] [email or web address]

## 2020-09-13 ENCOUNTER — Other Ambulatory Visit: Payer: Self-pay | Admitting: Obstetrics and Gynecology

## 2020-09-13 DIAGNOSIS — Z1231 Encounter for screening mammogram for malignant neoplasm of breast: Secondary | ICD-10-CM

## 2020-09-13 DIAGNOSIS — Z01419 Encounter for gynecological examination (general) (routine) without abnormal findings: Secondary | ICD-10-CM | POA: Diagnosis not present

## 2020-09-16 DIAGNOSIS — M25642 Stiffness of left hand, not elsewhere classified: Secondary | ICD-10-CM | POA: Diagnosis not present

## 2020-09-16 DIAGNOSIS — M79645 Pain in left finger(s): Secondary | ICD-10-CM | POA: Diagnosis not present

## 2020-09-16 DIAGNOSIS — M24542 Contracture, left hand: Secondary | ICD-10-CM | POA: Diagnosis not present

## 2020-09-16 DIAGNOSIS — S62617S Displaced fracture of proximal phalanx of left little finger, sequela: Secondary | ICD-10-CM | POA: Diagnosis not present

## 2020-09-16 DIAGNOSIS — S62615S Displaced fracture of proximal phalanx of left ring finger, sequela: Secondary | ICD-10-CM | POA: Diagnosis not present

## 2020-09-19 DIAGNOSIS — M79645 Pain in left finger(s): Secondary | ICD-10-CM | POA: Diagnosis not present

## 2020-09-19 DIAGNOSIS — S62617S Displaced fracture of proximal phalanx of left little finger, sequela: Secondary | ICD-10-CM | POA: Diagnosis not present

## 2020-09-19 DIAGNOSIS — M24542 Contracture, left hand: Secondary | ICD-10-CM | POA: Diagnosis not present

## 2020-09-19 DIAGNOSIS — S62615S Displaced fracture of proximal phalanx of left ring finger, sequela: Secondary | ICD-10-CM | POA: Diagnosis not present

## 2020-09-19 DIAGNOSIS — M25642 Stiffness of left hand, not elsewhere classified: Secondary | ICD-10-CM | POA: Diagnosis not present

## 2020-09-23 DIAGNOSIS — S62615S Displaced fracture of proximal phalanx of left ring finger, sequela: Secondary | ICD-10-CM | POA: Diagnosis not present

## 2020-09-23 DIAGNOSIS — S62617S Displaced fracture of proximal phalanx of left little finger, sequela: Secondary | ICD-10-CM | POA: Diagnosis not present

## 2020-09-23 DIAGNOSIS — M79645 Pain in left finger(s): Secondary | ICD-10-CM | POA: Diagnosis not present

## 2020-09-23 DIAGNOSIS — M25642 Stiffness of left hand, not elsewhere classified: Secondary | ICD-10-CM | POA: Diagnosis not present

## 2020-09-23 DIAGNOSIS — M24542 Contracture, left hand: Secondary | ICD-10-CM | POA: Diagnosis not present

## 2020-09-28 DIAGNOSIS — N3941 Urge incontinence: Secondary | ICD-10-CM | POA: Diagnosis not present

## 2020-09-30 DIAGNOSIS — M24542 Contracture, left hand: Secondary | ICD-10-CM | POA: Diagnosis not present

## 2020-09-30 DIAGNOSIS — M25642 Stiffness of left hand, not elsewhere classified: Secondary | ICD-10-CM | POA: Diagnosis not present

## 2020-09-30 DIAGNOSIS — M79645 Pain in left finger(s): Secondary | ICD-10-CM | POA: Diagnosis not present

## 2020-09-30 DIAGNOSIS — S62615S Displaced fracture of proximal phalanx of left ring finger, sequela: Secondary | ICD-10-CM | POA: Diagnosis not present

## 2020-09-30 DIAGNOSIS — S62617S Displaced fracture of proximal phalanx of left little finger, sequela: Secondary | ICD-10-CM | POA: Diagnosis not present

## 2020-10-03 DIAGNOSIS — M79645 Pain in left finger(s): Secondary | ICD-10-CM | POA: Diagnosis not present

## 2020-10-03 DIAGNOSIS — S62615S Displaced fracture of proximal phalanx of left ring finger, sequela: Secondary | ICD-10-CM | POA: Diagnosis not present

## 2020-10-03 DIAGNOSIS — M25642 Stiffness of left hand, not elsewhere classified: Secondary | ICD-10-CM | POA: Diagnosis not present

## 2020-10-03 DIAGNOSIS — M24542 Contracture, left hand: Secondary | ICD-10-CM | POA: Diagnosis not present

## 2020-10-03 DIAGNOSIS — S62617S Displaced fracture of proximal phalanx of left little finger, sequela: Secondary | ICD-10-CM | POA: Diagnosis not present

## 2020-10-06 DIAGNOSIS — H26492 Other secondary cataract, left eye: Secondary | ICD-10-CM | POA: Diagnosis not present

## 2020-10-07 DIAGNOSIS — M24542 Contracture, left hand: Secondary | ICD-10-CM | POA: Diagnosis not present

## 2020-10-07 DIAGNOSIS — M25642 Stiffness of left hand, not elsewhere classified: Secondary | ICD-10-CM | POA: Diagnosis not present

## 2020-10-07 DIAGNOSIS — S62617S Displaced fracture of proximal phalanx of left little finger, sequela: Secondary | ICD-10-CM | POA: Diagnosis not present

## 2020-10-07 DIAGNOSIS — S62615S Displaced fracture of proximal phalanx of left ring finger, sequela: Secondary | ICD-10-CM | POA: Diagnosis not present

## 2020-10-10 DIAGNOSIS — S62617S Displaced fracture of proximal phalanx of left little finger, sequela: Secondary | ICD-10-CM | POA: Diagnosis not present

## 2020-10-10 DIAGNOSIS — M79645 Pain in left finger(s): Secondary | ICD-10-CM | POA: Diagnosis not present

## 2020-10-10 DIAGNOSIS — S62615S Displaced fracture of proximal phalanx of left ring finger, sequela: Secondary | ICD-10-CM | POA: Diagnosis not present

## 2020-10-10 DIAGNOSIS — M24542 Contracture, left hand: Secondary | ICD-10-CM | POA: Diagnosis not present

## 2020-10-10 DIAGNOSIS — M25642 Stiffness of left hand, not elsewhere classified: Secondary | ICD-10-CM | POA: Diagnosis not present

## 2020-10-12 DIAGNOSIS — M24542 Contracture, left hand: Secondary | ICD-10-CM | POA: Diagnosis not present

## 2020-10-12 DIAGNOSIS — M79645 Pain in left finger(s): Secondary | ICD-10-CM | POA: Diagnosis not present

## 2020-10-12 DIAGNOSIS — S62617S Displaced fracture of proximal phalanx of left little finger, sequela: Secondary | ICD-10-CM | POA: Diagnosis not present

## 2020-10-12 DIAGNOSIS — S62615S Displaced fracture of proximal phalanx of left ring finger, sequela: Secondary | ICD-10-CM | POA: Diagnosis not present

## 2020-10-12 DIAGNOSIS — M25642 Stiffness of left hand, not elsewhere classified: Secondary | ICD-10-CM | POA: Diagnosis not present

## 2020-10-17 DIAGNOSIS — S62617S Displaced fracture of proximal phalanx of left little finger, sequela: Secondary | ICD-10-CM | POA: Diagnosis not present

## 2020-10-17 DIAGNOSIS — S62615S Displaced fracture of proximal phalanx of left ring finger, sequela: Secondary | ICD-10-CM | POA: Diagnosis not present

## 2020-10-17 DIAGNOSIS — M24542 Contracture, left hand: Secondary | ICD-10-CM | POA: Diagnosis not present

## 2020-10-17 DIAGNOSIS — M79645 Pain in left finger(s): Secondary | ICD-10-CM | POA: Diagnosis not present

## 2020-10-19 DIAGNOSIS — M79645 Pain in left finger(s): Secondary | ICD-10-CM | POA: Diagnosis not present

## 2020-10-19 DIAGNOSIS — S62617S Displaced fracture of proximal phalanx of left little finger, sequela: Secondary | ICD-10-CM | POA: Diagnosis not present

## 2020-10-19 DIAGNOSIS — M25642 Stiffness of left hand, not elsewhere classified: Secondary | ICD-10-CM | POA: Diagnosis not present

## 2020-10-19 DIAGNOSIS — S62615S Displaced fracture of proximal phalanx of left ring finger, sequela: Secondary | ICD-10-CM | POA: Diagnosis not present

## 2020-10-24 DIAGNOSIS — S62617S Displaced fracture of proximal phalanx of left little finger, sequela: Secondary | ICD-10-CM | POA: Diagnosis not present

## 2020-10-24 DIAGNOSIS — S62615S Displaced fracture of proximal phalanx of left ring finger, sequela: Secondary | ICD-10-CM | POA: Diagnosis not present

## 2020-10-24 DIAGNOSIS — M24542 Contracture, left hand: Secondary | ICD-10-CM | POA: Diagnosis not present

## 2020-10-25 DIAGNOSIS — S62615S Displaced fracture of proximal phalanx of left ring finger, sequela: Secondary | ICD-10-CM | POA: Diagnosis not present

## 2020-10-25 DIAGNOSIS — S62617S Displaced fracture of proximal phalanx of left little finger, sequela: Secondary | ICD-10-CM | POA: Diagnosis not present

## 2020-10-25 DIAGNOSIS — M79645 Pain in left finger(s): Secondary | ICD-10-CM | POA: Diagnosis not present

## 2020-10-25 DIAGNOSIS — M25642 Stiffness of left hand, not elsewhere classified: Secondary | ICD-10-CM | POA: Diagnosis not present

## 2020-10-27 ENCOUNTER — Ambulatory Visit
Admission: RE | Admit: 2020-10-27 | Discharge: 2020-10-27 | Disposition: A | Discharge status: Home / Self Care | Payer: Medicare Other | Source: Ambulatory Visit | Attending: Obstetrics and Gynecology | Admitting: Obstetrics and Gynecology

## 2020-10-27 ENCOUNTER — Other Ambulatory Visit: Payer: Self-pay

## 2020-10-27 DIAGNOSIS — Z1231 Encounter for screening mammogram for malignant neoplasm of breast: Secondary | ICD-10-CM | POA: Diagnosis not present

## 2020-11-08 DIAGNOSIS — M25642 Stiffness of left hand, not elsewhere classified: Secondary | ICD-10-CM | POA: Diagnosis not present

## 2020-11-08 DIAGNOSIS — M79645 Pain in left finger(s): Secondary | ICD-10-CM | POA: Diagnosis not present

## 2020-11-08 DIAGNOSIS — S62617S Displaced fracture of proximal phalanx of left little finger, sequela: Secondary | ICD-10-CM | POA: Diagnosis not present

## 2020-11-08 DIAGNOSIS — S62615S Displaced fracture of proximal phalanx of left ring finger, sequela: Secondary | ICD-10-CM | POA: Diagnosis not present

## 2020-11-15 DIAGNOSIS — S62615S Displaced fracture of proximal phalanx of left ring finger, sequela: Secondary | ICD-10-CM | POA: Diagnosis not present

## 2020-11-15 DIAGNOSIS — S62617S Displaced fracture of proximal phalanx of left little finger, sequela: Secondary | ICD-10-CM | POA: Diagnosis not present

## 2020-11-15 DIAGNOSIS — M79645 Pain in left finger(s): Secondary | ICD-10-CM | POA: Diagnosis not present

## 2020-11-15 DIAGNOSIS — M25642 Stiffness of left hand, not elsewhere classified: Secondary | ICD-10-CM | POA: Diagnosis not present

## 2020-11-21 DIAGNOSIS — S62617S Displaced fracture of proximal phalanx of left little finger, sequela: Secondary | ICD-10-CM | POA: Diagnosis not present

## 2020-11-21 DIAGNOSIS — M24542 Contracture, left hand: Secondary | ICD-10-CM | POA: Diagnosis not present

## 2020-11-21 DIAGNOSIS — S62615S Displaced fracture of proximal phalanx of left ring finger, sequela: Secondary | ICD-10-CM | POA: Diagnosis not present

## 2020-11-23 DIAGNOSIS — Z23 Encounter for immunization: Secondary | ICD-10-CM | POA: Diagnosis not present

## 2020-11-25 ENCOUNTER — Other Ambulatory Visit: Payer: Self-pay | Admitting: *Deleted

## 2020-11-25 NOTE — Patient Outreach (Signed)
[facility]) Care Management  [place]  11/25/2020   [name] 1951/08/08 [id number]   [name]  Referral Date:10/27/2019 Referral Source:EMMI Prevent Reason for Referral:Screening Insurance:AetnaMedicare   Outreach Attempt:  Outreach attempt #1 to patient for follow up. No answer. RN Health Coach left voicemail message along with contact information.  Plan:  RN Health Coach will make another outreach attempt within the month of January if no return call back from patient.   [place] [phone] [name]

## 2020-11-29 ENCOUNTER — Encounter: Payer: Self-pay | Admitting: *Deleted

## 2020-11-29 ENCOUNTER — Other Ambulatory Visit: Payer: Self-pay | Admitting: *Deleted

## 2020-11-29 NOTE — Patient Instructions (Signed)
Goals Addressed              This Visit's Progress   .  [facility]) Enhance My Mental Skills        Timeframe:  Short-Term Goal Priority:  High Start Date:   11/29/2020                          Expected End Date:  03/23/2021                       - do word search or crossword puzzles daily - stay in touch with my family and friends - take a walk daily and think about what I am seeing -discuss memory issues with PCP    Why is this important?    As we age, or sometimes because we have an illness, it feels like our memory and ability to figure things out is not very good.   There are things you can do to keep your memory and your thinking as strong as possible.    Notes:     .  [facility]) Learn More About My Health        Timeframe:  Long-Range Goal Priority:  Medium Start Date:  11/29/2020                           Expected End Date:  05/23/2021                       - tell my story and reason for my visit - make a list of questions - ask questions - repeat what I heard to make sure I understand - bring a list of my medicines to the visit - speak up when I don't understand    Why is this important?    The best way to learn about your health and care is by talking to the doctor and nurse.   They will answer your questions and give you information in the way that you like best.    Notes:     .  [facility]) Make and Keep All Appointments        Timeframe:  Short-Term Goal Priority:  High Start Date:   11/29/2020                          Expected End Date:  03/23/2021                       - ask family or friend for a ride - call to cancel if needed - keep a calendar with appointment dates -attempt to schedule appointment with PCP as soon as possible to discuss symptoms of anger and memory problems    Why is this important?    Part of staying healthy is seeing the doctor for follow-up care.   If you forget your appointments, there are some things you can do to stay on track.      Notes:     .  [facility]) Manage My Emotions        Timeframe:  Short-Term Goal Priority:  High Start Date:   11/29/2020                          Expected End Date:  03/23/21                       -  begin personal counseling - practice relaxation or meditation daily - talk about feelings with a friend, family or spiritual advisor - practice positive thinking and self-talk -work with [employer] Social Worker for depression resources    Why is this important?    When you are stressed, down or upset, your body reacts too.   For example, your blood pressure may get higher; you may have a headache or stomachache.   When your emotions get the best of you, your body's ability to fight off cold and flu gets weak.   These steps will help you manage your emotions.     Notes:     .  ([facility]) Monitor and Manage My Blood Sugar-Diabetes Type 2        Timeframe:  Long-Range Goal Priority:  Medium Start Date:  11/29/2020                           Expected End Date:  05/23/2021                       - check blood sugar at prescribed times - check blood sugar if I feel it is too high or too low - enter blood sugar readings and medication or insulin into daily log - take the blood sugar log to all doctor visits - take the blood sugar meter to all doctor visits -use fingerstick blood sugars to compare to [facility] when feeling like it is inaccurate    Why is this important?    Checking your blood sugar at home helps to keep it from getting very high or very low.   Writing the results in a diary or log helps the doctor know how to care for you.   Your blood sugar log should have the time, date and the results.   Also, write down the amount of insulin or other medicine that you take.   Other information, like what you ate, exercise done and how you were feeling, will also be helpful.     Notes:     .  COMPLETED: [facility]) Patient will report maintaining Hgb A1C of 6.5 or below within the next 90  days. (pt-stated)        CARE PLAN ENTRY (see longitudinal plan of care for additional care plan information)  Objective:  Lab Results  Component Value Date   HGBA1C 5.3 10/29/2018 .   Lab Results  Component Value Date   CREATININE 0.81 11/21/2019   CREATININE 1.08 (H) 10/29/2018   CREATININE 1.16 (H) 08/04/2018 .   Marland Kitchen No results found for: EGFR  Current Barriers:  Marland Kitchen Knowledge Deficits related to basic Diabetes pathophysiology and self care/management  Case Manager Clinical Goal(s):  Over the next 90 days, patient will demonstrate improved adherence to prescribed treatment plan for diabetes self care/management as evidenced by: maintaining Hgb A1C of 6.5 or below . Verbalize daily monitoring and recording of CBG within 90 days . Verbalize adherence to ADA/ carb modified diet within the next 90 days . Verbalize adherence to prescribed medication regimen within the next 90 days   Interventions:  . Provided education to patient about basic DM disease process . Reviewed medications with patient and discussed importance of medication adherence . Discussed plans with patient for ongoing care management follow up and provided patient with direct contact information for care management team . Provided patient with written educational materials related to hypo and hyperglycemia and  importance of correct treatment . Reviewed scheduled/upcoming provider appointments including: continued outpatient therapy and primary care follow up . Advised patient, providing education and rationale, to check cbg at least daily and record, calling primary care provider for findings outside established parameters.   . Congratulated patient on current reduction of Hgb A1C of 6  Patient Self Care Activities:  . Self administers oral medications as prescribed . Attends all scheduled provider appointments . Checks blood sugars as prescribed and utilize hyper and hypoglycemia protocol as needed . Adheres to  prescribed ADA/carb modified  Initial goal documentation   Resolving due to duplicate goals     .  [facility]) Set My Target A1C-Diabetes Type 2        Timeframe:  Long-Range Goal Priority:  Medium Start Date:   11/29/2020                          Expected End Date:  05/23/2021                       - set target A1C; goal 6; current is 6    Why is this important?    Your target A1C is decided together by you and your doctor.   It is based on several things like your age and other health issues.    Notes:

## 2020-11-29 NOTE — Patient Outreach (Addendum)
[facility]) Care Management  [place]  11/29/2020   [name] 1951/09/29 [id number]   [place]  Referral Date:10/27/2019 Referral Source:EMMI Prevent Reason for Referral:Screening Insurance:[employer]are    Addendum:   Received telephone call back from Dr. Thompson Caul assistant on 11/29/2020 afternoon, stating she was trying to reach patient.  RN Health Coach provided assistant with patient's home, cell and husband's cell number.  Assistant stated she would try and reach patient about appointment options.   Outreach Attempt:  Successful telephone outreach to patient for follow up.  HIPAA verified with patient.  Patient reporting she is having anger issues to the point of throwing things at people.  Reports being depressed almost daily and having trouble remembering things (such as where she is, what she has done, grand kids birthdays, husband clothes, lots of things).  States this causes much anger and she just throws things, has grabbed husbands glasses and threw them at him.  Very unhappy with her behavior.  [facility] Social Work referral discussed and patient verbally agrees to assistance with depression resources.  Also discussed and encouraged patient needs to be seen by primary care provider and patient agrees.  RN Health Coach outreached to PCP office to arrange appointment and spoke with [facility].  Per Gwenette Greet no appointment available in the next few weeks, so message sent to provider.  Patient continues to wear [employer] continuous blood sugar monitoring system.  States she has been monitoring about every other day due to her memory/anger issues.  Reports fasting blood sugars have been ranging 60-80's, but she feels the meter is inaccurate.  Encouraged to check fingersticks when she feels continuous meter is not accurate.  Discussed with patient I will be transitioning to new role and another [place] will be contacting  in the future.  Patient continues to agree to future outreaches.  Encounter Medications:  Outpatient Encounter Medications as of 11/29/2020  Medication Sig Note  . amLODipine (NORVASC) 5 MG tablet Take 5 mg by mouth daily.   . Ascorbic Acid (VITAMIN C) 1000 MG tablet Take 1,000 mg by mouth daily.   Marland Kitchen atorvastatin (LIPITOR) 80 MG tablet Take 80 mg by mouth daily.   . calcium carbonate (OSCAL) 1500 (600 Ca) MG TABS tablet Take by mouth daily.   . Cholecalciferol (VITAMIN D-3) 125 MCG (5000 UT) TABS Take by mouth.   . DEXILANT 60 MG capsule Take 1 capsule by mouth daily.   . diphenhydrAMINE (BENADRYL) 25 mg capsule Take 25 mg by mouth every 6 (six) hours as needed.   . divalproex (DEPAKOTE ER) 500 MG 24 hr tablet TAKE 1 TABLET BY MOUTH EVERY MORNING AND TAKE 2 TABLETS BY MOUTH EVERY EVENING (Patient taking differently: Take 500-1,000 mg by mouth See admin instructions. Take 1 tablet (500 mg) by mouth every morning and 2 tablets (1000 mg) at night) 01/23/2020: [facility] states [facility] June [facility]  (#270) - pt states she is still taking 3 daily and has some at home  . Erenumab-aooe (AIMOVIG, 140 MG DOSE, [place]) Inject 140 mg into the skin every 30 (thirty) days. For migraine prevention   . ezetimibe (ZETIA) 10 MG tablet Take 1 tablet by mouth daily.   Marland Kitchen HYDROcodone-acetaminophen (NORCO/VICODIN) 5-325 MG tablet Take by mouth.   . magnesium oxide (MAG-OX) 400 MG tablet Take 500 mg by mouth daily.   . metFORMIN (GLUCOPHAGE-XR) 500 MG 24 hr tablet Take 500 mg by mouth daily with breakfast.   . methocarbamol (  ROBAXIN) 750 MG tablet  09/12/2020: Takes as needed  . Multiple Vitamin (MULTIVITAMIN WITH MINERALS) TABS Take 1 tablet by mouth daily.   Marland Kitchen pyridOXINE (VITAMIN B-6) 100 MG tablet Take 100 mg by mouth daily.   . SUMAtriptan-naproxen (TREXIMET) 85-500 MG tablet Take 1 tablet by mouth 2 (two) times daily as needed for migraine. Max 2 tab per 24 hours, 1-2 days per week   . topiramate (TOPAMAX) 100 MG tablet Take  200 mg by mouth daily.    Marland Kitchen Ubrogepant (UBRELVY) 100 MG TABS Take 100 mg by mouth daily as needed (migraines).   . vitamin B-12 (CYANOCOBALAMIN) 100 MCG tablet Take 100 mcg by mouth daily.   . ferrous sulfate 325 (65 FE) MG tablet Take 325 mg by mouth daily with breakfast. (Patient not taking: Reported on 09/12/2020) 09/12/2020: Need to refill  . oxyCODONE-acetaminophen (PERCOCET/ROXICET) 5-325 MG tablet Take by mouth. (Patient not taking: Reported on 11/29/2020) 11/29/2020: Reports not taking  . traMADol (ULTRAM) 50 MG tablet Take by mouth. (Patient not taking: Reported on 11/29/2020) 11/29/2020: Reports not taking  . zolpidem (AMBIEN) 5 MG tablet Take 5 mg by mouth at bedtime as needed for sleep. (Patient not taking: Reported on 11/29/2020) 11/29/2020: Reports not taking   No facility-administered encounter medications on file as of 11/29/2020.    Functional Status:  No flowsheet data found.  Fall/Depression Screening: Fall Risk  11/29/2020 09/12/2020 04/08/2020  Falls in the past year? 1 0 1  Comment - - -  Number falls in past yr: 0 0 1  Comment [place] out of bed on Thanksgiving 2021 - -  Injury with Fall? 0 0 0  Comment - - -  Risk for fall due to : History of fall(s);Impaired vision;Medication side effect History of fall(s);Impaired vision;Medication side effect History of fall(s);Medication side effect;Impaired vision  Follow up Falls evaluation completed;Education provided;Falls prevention discussed Falls evaluation completed;Education provided;Falls prevention discussed Falls evaluation completed;Education provided;Falls prevention discussed   PHQ 2/9 Scores 11/29/2020 11/26/2019 10/28/2019 06/02/2014  PHQ - 2 Score 3 0 0 0  PHQ- 9 Score 7 - - -    Goals Addressed              This Visit's Progress   .  [facility]) Enhance My Mental Skills        Timeframe:  Short-Term Goal Priority:  High Start Date:   11/29/2020                          Expected End Date:  03/23/2021                        - do word search or crossword puzzles daily - stay in touch with my family and friends - take a walk daily and think about what I am seeing -discuss memory issues with PCP    Why is this important?    As we age, or sometimes because we have an illness, it feels like our memory and ability to figure things out is not very good.   There are things you can do to keep your memory and your thinking as strong as possible.    Notes:     .  [facility]) Learn More About My Health        Timeframe:  Long-Range Goal Priority:  Medium Start Date:  11/29/2020  Expected End Date:  05/23/2021                       - tell my story and reason for my visit - make a list of questions - ask questions - repeat what I heard to make sure I understand - bring a list of my medicines to the visit - speak up when I don't understand    Why is this important?    The best way to learn about your health and care is by talking to the doctor and nurse.   They will answer your questions and give you information in the way that you like best.    Notes:     .  [facility]) Make and Keep All Appointments        Timeframe:  Short-Term Goal Priority:  High Start Date:   11/29/2020                          Expected End Date:  03/23/2021                       - ask family or friend for a ride - call to cancel if needed - keep a calendar with appointment dates -attempt to schedule appointment with PCP as soon as possible to discuss symptoms of anger and memory problems    Why is this important?    Part of staying healthy is seeing the doctor for follow-up care.   If you forget your appointments, there are some things you can do to stay on track.    Notes:     .  [facility]) Manage My Emotions        Timeframe:  Short-Term Goal Priority:  High Start Date:   11/29/2020                          Expected End Date:  03/23/21                       - begin personal counseling - practice relaxation or  meditation daily - talk about feelings with a friend, family or spiritual advisor - practice positive thinking and self-talk -work with [facility] Social Worker for depression resources    Why is this important?    When you are stressed, down or upset, your body reacts too.   For example, your blood pressure may get higher; you may have a headache or stomachache.   When your emotions get the best of you, your body's ability to fight off cold and flu gets weak.   These steps will help you manage your emotions.     Notes:     .  ([facility]) Monitor and Manage My Blood Sugar-Diabetes Type 2        Timeframe:  Long-Range Goal Priority:  Medium Start Date:  11/29/2020                           Expected End Date:  05/23/2021                       - check blood sugar at prescribed times - check blood sugar if I feel it is too high or too low - enter blood sugar readings and medication or insulin into daily log - take the  blood sugar log to all doctor visits - take the blood sugar meter to all doctor visits -use fingerstick blood sugars to compare to [facility] when feeling like it is inaccurate    Why is this important?    Checking your blood sugar at home helps to keep it from getting very high or very low.   Writing the results in a diary or log helps the doctor know how to care for you.   Your blood sugar log should have the time, date and the results.   Also, write down the amount of insulin or other medicine that you take.   Other information, like what you ate, exercise done and how you were feeling, will also be helpful.     Notes:     .  COMPLETED: [facility]) Patient will report maintaining Hgb A1C of 6.5 or below within the next 90 days. (pt-stated)        CARE PLAN ENTRY (see longitudinal plan of care for additional care plan information)  Objective:  Lab Results  Component Value Date   HGBA1C 5.3 10/29/2018 .   Lab Results  Component Value Date   CREATININE 0.81  11/21/2019   CREATININE 1.08 (H) 10/29/2018   CREATININE 1.16 (H) 08/04/2018 .   Marland Kitchen No results found for: EGFR  Current Barriers:  Marland Kitchen Knowledge Deficits related to basic Diabetes pathophysiology and self care/management  Case Manager Clinical Goal(s):  Over the next 90 days, patient will demonstrate improved adherence to prescribed treatment plan for diabetes self care/management as evidenced by: maintaining Hgb A1C of 6.5 or below . Verbalize daily monitoring and recording of CBG within 90 days . Verbalize adherence to ADA/ carb modified diet within the next 90 days . Verbalize adherence to prescribed medication regimen within the next 90 days   Interventions:  . Provided education to patient about basic DM disease process . Reviewed medications with patient and discussed importance of medication adherence . Discussed plans with patient for ongoing care management follow up and provided patient with direct contact information for care management team . Provided patient with written educational materials related to hypo and hyperglycemia and importance of correct treatment . Reviewed scheduled/upcoming provider appointments including: continued outpatient therapy and primary care follow up . Advised patient, providing education and rationale, to check cbg at least daily and record, calling primary care provider for findings outside established parameters.   . Congratulated patient on current reduction of Hgb A1C of 6  Patient Self Care Activities:  . Self administers oral medications as prescribed . Attends all scheduled provider appointments . Checks blood sugars as prescribed and utilize hyper and hypoglycemia protocol as needed . Adheres to prescribed ADA/carb modified  Initial goal documentation   Resolving due to duplicate goals     .  [facility]) Set My Target A1C-Diabetes Type 2        Timeframe:  Long-Range Goal Priority:  Medium Start Date:   11/29/2020                           Expected End Date:  05/23/2021                       - set target A1C; goal 6; current is 6    Why is this important?    Your target A1C is decided together by you and your doctor.   It is based on several things like your  age and other health issues.    Notes:       Appointments:  Patient has scheduled appointment with primary care provider, Dr. Tamala Julian on 01/04/2021.  Awaiting return call from office for sooner appointment.  Plan: RN Health Coach will send primary care provider quarterly update. Follow-up:  Patient agrees to Care Plan and Follow-up.  RN Health Coach will make next telephone outreach to patient within the month of January and patient agrees to future outreach. RN Health Coach placed [facility] Social Work referral for depression resources.  [place] [phone] [email or web address]

## 2020-11-30 ENCOUNTER — Other Ambulatory Visit: Payer: Self-pay | Admitting: *Deleted

## 2020-11-30 NOTE — Patient Outreach (Signed)
[place] [facility]) Care Management  11/30/2020  [name] [birth year] [id number]   CSW received referral for mental health support and attempted outreach to pt on 11/29/2020.  CSW was unable to reach pt and left a HIPPA compliant voice message.  CSW will await callback or try again in 3-4 business days per policy.  CSW will mail pt an Unsuccessful Outreach letter as well.   Eduard Clos, MSW, [place] Worker  [place] [phone]

## 2020-12-01 ENCOUNTER — Other Ambulatory Visit: Payer: Self-pay | Admitting: *Deleted

## 2020-12-01 DIAGNOSIS — S62615S Displaced fracture of proximal phalanx of left ring finger, sequela: Secondary | ICD-10-CM | POA: Diagnosis not present

## 2020-12-01 DIAGNOSIS — M25642 Stiffness of left hand, not elsewhere classified: Secondary | ICD-10-CM | POA: Diagnosis not present

## 2020-12-01 DIAGNOSIS — S62617S Displaced fracture of proximal phalanx of left little finger, sequela: Secondary | ICD-10-CM | POA: Diagnosis not present

## 2020-12-01 DIAGNOSIS — M79645 Pain in left finger(s): Secondary | ICD-10-CM | POA: Diagnosis not present

## 2020-12-01 NOTE — Patient Outreach (Addendum)
[place] [facility]) Care Management  12/01/2020  [name] 30-Aug-1951 [id number]   CSW received referral for depression and was able to make initial contact with pt today.  Pt confirmed her identity and CSW introduced self, role and reason for call.  Pt admits to having depression; states she has been on RX (Depakote and hydrocodone) and "figured out they were making it worse".  Per pt, she has reduced the amount of Depakote and things are better.  "I've got a good control of it now".  Pt is receptive to referral for counseling; prefers female and a morning appointment.  Per pt, her husband drives her wherever she needs to go due to her not driving.   CSW will seek in network providers and advise for referral/appointment.   Pt denies any SI/HI or past hospitalizations.  Pt is reminded to call 911 if any emergent needs arise.   CSW offered to call next week for follow up however pt prefers a call in 2 weeks.   Eduard Clos, MSW, [place] Worker  [place] [phone]

## 2020-12-01 NOTE — Patient Outreach (Addendum)
[place] [facility]) Care Management  12/01/2020  [name] October 28, 1951 [id number]  Referral made to [facility] for mental health counseling intervention.  CSW advised to expect a call from them to schedule appointment   Eduard Clos, MSW, [place] Worker  [place] [phone]

## 2020-12-08 DIAGNOSIS — G44021 Chronic cluster headache, intractable: Secondary | ICD-10-CM | POA: Diagnosis not present

## 2020-12-08 DIAGNOSIS — R413 Other amnesia: Secondary | ICD-10-CM | POA: Diagnosis not present

## 2020-12-08 DIAGNOSIS — D329 Benign neoplasm of meninges, unspecified: Secondary | ICD-10-CM | POA: Diagnosis not present

## 2020-12-14 ENCOUNTER — Other Ambulatory Visit: Payer: Self-pay | Admitting: *Deleted

## 2020-12-14 NOTE — Patient Outreach (Signed)
[place] [facility]) Care Management  12/14/2020  [name] [birth year] [id number]   CSW attempted to reach pt and was unable. CSW was able to leave a HIPPA compliant voice message and will await callback or try again in 3-4 business days.       Eduard Clos, MSW, [place] Worker  [place] [phone]

## 2020-12-15 DIAGNOSIS — M79645 Pain in left finger(s): Secondary | ICD-10-CM | POA: Diagnosis not present

## 2020-12-15 DIAGNOSIS — M25642 Stiffness of left hand, not elsewhere classified: Secondary | ICD-10-CM | POA: Diagnosis not present

## 2020-12-15 DIAGNOSIS — S62617S Displaced fracture of proximal phalanx of left little finger, sequela: Secondary | ICD-10-CM | POA: Diagnosis not present

## 2020-12-15 DIAGNOSIS — S62615S Displaced fracture of proximal phalanx of left ring finger, sequela: Secondary | ICD-10-CM | POA: Diagnosis not present

## 2020-12-20 ENCOUNTER — Ambulatory Visit: Payer: Self-pay | Admitting: *Deleted

## 2020-12-27 DIAGNOSIS — M79645 Pain in left finger(s): Secondary | ICD-10-CM | POA: Diagnosis not present

## 2020-12-27 DIAGNOSIS — M25642 Stiffness of left hand, not elsewhere classified: Secondary | ICD-10-CM | POA: Diagnosis not present

## 2020-12-27 DIAGNOSIS — S62617S Displaced fracture of proximal phalanx of left little finger, sequela: Secondary | ICD-10-CM | POA: Diagnosis not present

## 2020-12-27 DIAGNOSIS — S62615S Displaced fracture of proximal phalanx of left ring finger, sequela: Secondary | ICD-10-CM | POA: Diagnosis not present

## 2020-12-29 ENCOUNTER — Other Ambulatory Visit: Payer: Self-pay | Admitting: *Deleted

## 2020-12-29 NOTE — Patient Outreach (Signed)
[facility]) Care Management  12/29/2020  [name] [birth year] [id number]   RN Health Coach attempted follow up outreach call to patient.  Patient was unavailable. HIPPA compliance voicemail message left with return callback number.  Plan: RN will call patient again within 30 days.  Gean Maidens BSN RN [facility] Care Management [phone]

## 2021-01-02 ENCOUNTER — Other Ambulatory Visit: Payer: Self-pay | Admitting: *Deleted

## 2021-01-03 NOTE — Patient Outreach (Signed)
[name] [facility]) Care Management  01/03/2021  [name] [birth year] [id number]   CSW spoke with pt by phone who reports her depression has improved, her anger issues resolved and she feels much better. Per pt, 2 of her medications have been adjusted and were causing it; the hydrocodone and depakote.  Pt denies the need for further outreach/support from CSW.  CSW will sign off and advise Care team,   Eduard Clos, MSW, [place] Worker  [place] [phone]

## 2021-01-04 DIAGNOSIS — E1169 Type 2 diabetes mellitus with other specified complication: Secondary | ICD-10-CM | POA: Diagnosis not present

## 2021-01-04 DIAGNOSIS — I10 Essential (primary) hypertension: Secondary | ICD-10-CM | POA: Diagnosis not present

## 2021-01-04 DIAGNOSIS — K219 Gastro-esophageal reflux disease without esophagitis: Secondary | ICD-10-CM | POA: Diagnosis not present

## 2021-01-04 DIAGNOSIS — R413 Other amnesia: Secondary | ICD-10-CM | POA: Diagnosis not present

## 2021-01-04 DIAGNOSIS — E785 Hyperlipidemia, unspecified: Secondary | ICD-10-CM | POA: Diagnosis not present

## 2021-01-05 DIAGNOSIS — M79645 Pain in left finger(s): Secondary | ICD-10-CM | POA: Diagnosis not present

## 2021-01-05 DIAGNOSIS — M25642 Stiffness of left hand, not elsewhere classified: Secondary | ICD-10-CM | POA: Diagnosis not present

## 2021-01-05 DIAGNOSIS — S62617S Displaced fracture of proximal phalanx of left little finger, sequela: Secondary | ICD-10-CM | POA: Diagnosis not present

## 2021-01-05 DIAGNOSIS — S62615S Displaced fracture of proximal phalanx of left ring finger, sequela: Secondary | ICD-10-CM | POA: Diagnosis not present

## 2021-01-12 DIAGNOSIS — M25642 Stiffness of left hand, not elsewhere classified: Secondary | ICD-10-CM | POA: Diagnosis not present

## 2021-01-12 DIAGNOSIS — S62615S Displaced fracture of proximal phalanx of left ring finger, sequela: Secondary | ICD-10-CM | POA: Diagnosis not present

## 2021-01-12 DIAGNOSIS — S62617S Displaced fracture of proximal phalanx of left little finger, sequela: Secondary | ICD-10-CM | POA: Diagnosis not present

## 2021-01-12 DIAGNOSIS — M79645 Pain in left finger(s): Secondary | ICD-10-CM | POA: Diagnosis not present

## 2021-01-19 DIAGNOSIS — S62615S Displaced fracture of proximal phalanx of left ring finger, sequela: Secondary | ICD-10-CM | POA: Diagnosis not present

## 2021-01-19 DIAGNOSIS — S62617S Displaced fracture of proximal phalanx of left little finger, sequela: Secondary | ICD-10-CM | POA: Diagnosis not present

## 2021-01-19 DIAGNOSIS — M25642 Stiffness of left hand, not elsewhere classified: Secondary | ICD-10-CM | POA: Diagnosis not present

## 2021-01-26 DIAGNOSIS — S62617S Displaced fracture of proximal phalanx of left little finger, sequela: Secondary | ICD-10-CM | POA: Diagnosis not present

## 2021-01-26 DIAGNOSIS — M25642 Stiffness of left hand, not elsewhere classified: Secondary | ICD-10-CM | POA: Diagnosis not present

## 2021-01-26 DIAGNOSIS — S62615S Displaced fracture of proximal phalanx of left ring finger, sequela: Secondary | ICD-10-CM | POA: Diagnosis not present

## 2021-01-26 DIAGNOSIS — M79645 Pain in left finger(s): Secondary | ICD-10-CM | POA: Diagnosis not present

## 2021-01-27 ENCOUNTER — Other Ambulatory Visit: Payer: Self-pay | Admitting: *Deleted

## 2021-01-27 NOTE — Patient Outreach (Signed)
[place] [facility]) Care Management  01/27/2021  [name] [birth year] [id number]   RN Health Coach attempted follow up outreach call to patient.  Patient was unavailable. HIPPA compliance voicemail message left with return callback number.  Plan: RN will call patient again within 30 days. RN sent unsuccessful outreach letter  [place] Management [phone]

## 2021-02-06 ENCOUNTER — Other Ambulatory Visit: Payer: Self-pay | Admitting: *Deleted

## 2021-02-09 DIAGNOSIS — M25642 Stiffness of left hand, not elsewhere classified: Secondary | ICD-10-CM | POA: Diagnosis not present

## 2021-02-09 DIAGNOSIS — S62615S Displaced fracture of proximal phalanx of left ring finger, sequela: Secondary | ICD-10-CM | POA: Diagnosis not present

## 2021-02-09 DIAGNOSIS — M79645 Pain in left finger(s): Secondary | ICD-10-CM | POA: Diagnosis not present

## 2021-02-09 DIAGNOSIS — S62617S Displaced fracture of proximal phalanx of left little finger, sequela: Secondary | ICD-10-CM | POA: Diagnosis not present

## 2021-02-16 NOTE — Patient Outreach (Signed)
[facility]) Care Management  [name]  02/06/2021 Late entry  [name] 05/31/1951 [id number]  [place] telephone call to patient.  [employer] compliance verified. Per patient sh is doing good. Patient A1C is 5.9  Awesome job. Per patient she has been trying to keep up with her health maintenance. Eye exam [id number] Dr Satira Sark.  Per patient she is doing word puzzles and things to help her mental health. Patient has agreed to further outreach calls.   Encounter Medications:  Outpatient Encounter Medications as of 02/06/2021  Medication Sig Note  . amLODipine (NORVASC) 5 MG tablet Take 5 mg by mouth daily.   Marland Kitchen atorvastatin (LIPITOR) 80 MG tablet Take 80 mg by mouth daily.   Marland Kitchen DEXILANT 60 MG capsule Take 1 capsule by mouth daily.   Eduard Roux (AIMOVIG, 140 MG DOSE, [place]) Inject 140 mg into the skin every 30 (thirty) days. For migraine prevention   . magnesium oxide (MAG-OX) 400 MG tablet Take 500 mg by mouth daily.   . SUMAtriptan-naproxen (TREXIMET) 85-500 MG tablet Take 1 tablet by mouth 2 (two) times daily as needed for migraine. Max 2 tab per 24 hours, 1-2 days per week   . topiramate (TOPAMAX) 100 MG tablet Take 200 mg by mouth daily.    Marland Kitchen Ubrogepant (UBRELVY) 100 MG TABS Take 100 mg by mouth daily as needed (migraines).   . Ascorbic Acid (VITAMIN C) 1000 MG tablet Take 1,000 mg by mouth daily.   . calcium carbonate (OSCAL) 1500 (600 Ca) MG TABS tablet Take by mouth daily.   . Cholecalciferol (VITAMIN D-3) 125 MCG (5000 UT) TABS Take by mouth.   . diphenhydrAMINE (BENADRYL) 25 mg capsule Take 25 mg by mouth every 6 (six) hours as needed.   . divalproex (DEPAKOTE ER) 500 MG 24 hr tablet TAKE 1 TABLET BY MOUTH EVERY MORNING AND TAKE 2 TABLETS BY MOUTH EVERY EVENING (Patient taking differently: Take 500-1,000 mg by mouth See admin instructions. Take 1 tablet (500 mg) by mouth every morning and 2 tablets (1000 mg) at night) 01/23/2020: [facility] states last filled  June 2020  (#270) - pt states she is still taking 3 daily and has some at home  . ezetimibe (ZETIA) 10 MG tablet Take 1 tablet by mouth daily.   . ferrous sulfate 325 (65 FE) MG tablet Take 325 mg by mouth daily with breakfast. (Patient not taking: Reported on 09/12/2020) 09/12/2020: Need to refill  . HYDROcodone-acetaminophen (NORCO/VICODIN) 5-325 MG tablet Take by mouth.   . metFORMIN (GLUCOPHAGE-XR) 500 MG 24 hr tablet Take 500 mg by mouth daily with breakfast. (Patient not taking: Reported on 02/06/2021)   . methocarbamol (ROBAXIN) 750 MG tablet  09/12/2020: Takes as needed  . Multiple Vitamin (MULTIVITAMIN WITH MINERALS) TABS Take 1 tablet by mouth daily.   Marland Kitchen oxyCODONE-acetaminophen (PERCOCET/ROXICET) 5-325 MG tablet Take by mouth. (Patient not taking: Reported on 11/29/2020) 11/29/2020: Reports not taking  . pyridOXINE (VITAMIN B-6) 100 MG tablet Take 100 mg by mouth daily.   . traMADol (ULTRAM) 50 MG tablet Take by mouth. (Patient not taking: Reported on 11/29/2020) 11/29/2020: Reports not taking  . vitamin B-12 (CYANOCOBALAMIN) 100 MCG tablet Take 100 mcg by mouth daily.   Marland Kitchen zolpidem (AMBIEN) 5 MG tablet Take 5 mg by mouth at bedtime as needed for sleep. (Patient not taking: Reported on 11/29/2020) 11/29/2020: Reports not taking   No facility-administered encounter medications on file as of 02/06/2021.    Functional Status:  No flowsheet data found.  Fall/Depression Screening: Fall Risk  11/29/2020 09/12/2020 04/08/2020  Falls in the past year? 1 0 1  Comment - - -  Number falls in past yr: 0 0 1  Comment [place] out of bed on Thanksgiving 2021 - -  Injury with Fall? 0 0 0  Comment - - -  Risk for fall due to : History of fall(s);Impaired vision;Medication side effect History of fall(s);Impaired vision;Medication side effect History of fall(s);Medication side effect;Impaired vision  Follow up Falls evaluation completed;Education provided;Falls prevention discussed Falls evaluation  completed;Education provided;Falls prevention discussed Falls evaluation completed;Education provided;Falls prevention discussed   PHQ 2/9 Scores 12/01/2020 11/29/2020 11/26/2019 10/28/2019 06/02/2014  PHQ - 2 Score 2 3 0 0 0  PHQ- 9 Score 6 7 - - -    Assessment:  Goals Addressed            This Visit's Progress   . [facility]) Enhance My Mental Skills   On track    Timeframe:  Short-Term Goal Priority:  High Start Date:   11/29/2020                          Expected End Date: [id number]                  - do word search or crossword puzzles daily - stay in touch with my family and friends - take a walk daily and think about what I am seeing -discuss memory issues with PCP    Why is this important?    As we age, or sometimes because we have an illness, it feels like our memory and ability to figure things out is not very good.   There are things you can do to keep your memory and your thinking as strong as possible.    Notes:     . [facility]) Learn More About My Health   On track    Timeframe:  Long-Range Goal Priority:  Medium Start Date:  11/29/2020                           Expected End Date:  [id number]            - tell my story and reason for my visit - make a list of questions - ask questions - repeat what I heard to make sure I understand - bring a list of my medicines to the visit - speak up when I don't understand    Why is this important?    The best way to learn about your health and care is by talking to the doctor and nurse.   They will answer your questions and give you information in the way that you like best.    Notes:     . [facility]) Make and Keep All Appointments   On track    Timeframe:  Short-Term Goal Priority:  High Start Date:   11/29/2020                          Expected End Date:  [id number]                      - ask family or friend for a ride - call to cancel if needed - keep a calendar with appointment dates -attempt to schedule appointment with  PCP as soon as possible  to discuss symptoms of anger and memory problems    Why is this important?    Part of staying healthy is seeing the doctor for follow-up care.   If you forget your appointments, there are some things you can do to stay on track.    Notes:     . [facility]) Manage My Emotions   On track    Timeframe:  Short-Term Goal Priority:  High Start Date:   11/29/2020                          Expected End Date:  [id number]                - begin personal counseling - practice relaxation or meditation daily - talk about feelings with a friend, family or spiritual advisor - practice positive thinking and self-talk -work with [facility] Social Worker for depression resources    Why is this important?    When you are stressed, down or upset, your body reacts too.   For example, your blood pressure may get higher; you may have a headache or stomachache.   When your emotions get the best of you, your body's ability to fight off cold and flu gets weak.   These steps will help you manage your emotions.     Notes:     . ([facility]) Monitor and Manage My Blood Sugar-Diabetes Type 2   On track    Timeframe:  Long-Range Goal Priority:  Medium Start Date:  11/29/2020                           Expected End Date:  [id number]              - check blood sugar at prescribed times - check blood sugar if I feel it is too high or too low - enter blood sugar readings and medication or insulin into daily log - take the blood sugar log to all doctor visits - take the blood sugar meter to all doctor visits -use fingerstick blood sugars to compare to [facility] when feeling like it is inaccurate    Why is this important?    Checking your blood sugar at home helps to keep it from getting very high or very low.   Writing the results in a diary or log helps the doctor know how to care for you.   Your blood sugar log should have the time, date and the results.   Also, write down the amount of  insulin or other medicine that you take.   Other information, like what you ate, exercise done and how you were feeling, will also be helpful.     Notes:  Patient A1C goal is 5.9    . [facility]) Set My Target A1C-Diabetes Type 2   On track    Timeframe:  Long-Range Goal Priority:  Medium Start Date:   11/29/2020                          Expected End Date:  05/23/2021                          Why is this important?    Your target A1C is decided together by you and your doctor.   It is based on several things like your age and other health  issues.    Notes:  A1C goal is 6.6  A1C is 5.9       Plan:  Follow-up:  Patient agrees to Care Plan and Follow-up. Provided calendar book Provided educational material on exercise Provided F.A.S.T. acronym picture sheet for stroke Provided educational material on healthy meal planning RN will follow up within the month of August RN sent update assessment to PCP  [place] [facility] [phone]

## 2021-02-16 NOTE — Patient Instructions (Addendum)
Goals Addressed            This Visit's Progress   . [facility]) Enhance My Mental Skills   On track    Timeframe:  Short-Term Goal Priority:  High Start Date:   11/29/2020                          Expected End Date: [id number]                  - do word search or crossword puzzles daily - stay in touch with my family and friends - take a walk daily and think about what I am seeing -discuss memory issues with PCP    Why is this important?    As we age, or sometimes because we have an illness, it feels like our memory and ability to figure things out is not very good.   There are things you can do to keep your memory and your thinking as strong as possible.    Notes:     . [facility]) Learn More About My Health   On track    Timeframe:  Long-Range Goal Priority:  Medium Start Date:  11/29/2020                           Expected End Date:  [id number]            - tell my story and reason for my visit - make a list of questions - ask questions - repeat what I heard to make sure I understand - bring a list of my medicines to the visit - speak up when I don't understand    Why is this important?    The best way to learn about your health and care is by talking to the doctor and nurse.   They will answer your questions and give you information in the way that you like best.    Notes:     . [facility]) Make and Keep All Appointments   On track    Timeframe:  Short-Term Goal Priority:  High Start Date:   11/29/2020                          Expected End Date:  [id number]                      - ask family or friend for a ride - call to cancel if needed - keep a calendar with appointment dates -attempt to schedule appointment with PCP as soon as possible to discuss symptoms of anger and memory problems    Why is this important?    Part of staying healthy is seeing the doctor for follow-up care.   If you forget your appointments, there are some things you can do to stay on track.     Notes:     . [facility]) Manage My Emotions   On track    Timeframe:  Short-Term Goal Priority:  High Start Date:   11/29/2020                          Expected End Date:  [id number]                - begin personal counseling - practice relaxation or meditation daily - talk about feelings with a  friend, family or spiritual advisor - practice positive thinking and self-talk -work with [facility] Social Worker for depression resources    Why is this important?    When you are stressed, down or upset, your body reacts too.   For example, your blood pressure may get higher; you may have a headache or stomachache.   When your emotions get the best of you, your body's ability to fight off cold and flu gets weak.   These steps will help you manage your emotions.     Notes:     . ([facility]) Monitor and Manage My Blood Sugar-Diabetes Type 2   On track    Timeframe:  Long-Range Goal Priority:  Medium Start Date:  11/29/2020                           Expected End Date:  [id number]              - check blood sugar at prescribed times - check blood sugar if I feel it is too high or too low - enter blood sugar readings and medication or insulin into daily log - take the blood sugar log to all doctor visits - take the blood sugar meter to all doctor visits -use fingerstick blood sugars to compare to [facility] when feeling like it is inaccurate    Why is this important?    Checking your blood sugar at home helps to keep it from getting very high or very low.   Writing the results in a diary or log helps the doctor know how to care for you.   Your blood sugar log should have the time, date and the results.   Also, write down the amount of insulin or other medicine that you take.   Other information, like what you ate, exercise done and how you were feeling, will also be helpful.     Notes:  Patient A1C goal is 5.9    . [facility]) Set My Target A1C-Diabetes Type 2   On track    Timeframe:   Long-Range Goal Priority:  Medium Start Date:   11/29/2020                          Expected End Date:  05/23/2021                          Why is this important?    Your target A1C is decided together by you and your doctor.   It is based on several things like your age and other health issues.    Notes:  A1C goal is 6.6  A1C is 5.9

## 2021-02-21 DIAGNOSIS — S62615S Displaced fracture of proximal phalanx of left ring finger, sequela: Secondary | ICD-10-CM | POA: Diagnosis not present

## 2021-02-21 DIAGNOSIS — S62617S Displaced fracture of proximal phalanx of left little finger, sequela: Secondary | ICD-10-CM | POA: Diagnosis not present

## 2021-02-21 DIAGNOSIS — M24542 Contracture, left hand: Secondary | ICD-10-CM | POA: Diagnosis not present

## 2021-02-24 ENCOUNTER — Ambulatory Visit: Payer: Managed Care, Other (non HMO) | Admitting: *Deleted

## 2021-03-20 DIAGNOSIS — G44021 Chronic cluster headache, intractable: Secondary | ICD-10-CM | POA: Diagnosis not present

## 2021-03-20 DIAGNOSIS — G43109 Migraine with aura, not intractable, without status migrainosus: Secondary | ICD-10-CM | POA: Diagnosis not present

## 2021-03-20 DIAGNOSIS — D329 Benign neoplasm of meninges, unspecified: Secondary | ICD-10-CM | POA: Diagnosis not present

## 2021-03-23 DIAGNOSIS — Z7984 Long term (current) use of oral hypoglycemic drugs: Secondary | ICD-10-CM | POA: Diagnosis not present

## 2021-03-23 DIAGNOSIS — Q799 Congenital malformation of musculoskeletal system, unspecified: Secondary | ICD-10-CM | POA: Diagnosis not present

## 2021-03-23 DIAGNOSIS — E1169 Type 2 diabetes mellitus with other specified complication: Secondary | ICD-10-CM | POA: Diagnosis not present

## 2021-03-23 DIAGNOSIS — R5382 Chronic fatigue, unspecified: Secondary | ICD-10-CM | POA: Diagnosis not present

## 2021-03-24 ENCOUNTER — Other Ambulatory Visit: Payer: Self-pay

## 2021-03-24 ENCOUNTER — Encounter (HOSPITAL_COMMUNITY): Payer: Self-pay

## 2021-03-24 ENCOUNTER — Emergency Department (HOSPITAL_COMMUNITY)
Admission: EM | Admit: 2021-03-24 | Discharge: 2021-03-24 | Disposition: A | Discharge status: Home / Self Care | Payer: Medicare Other | Attending: Emergency Medicine | Admitting: Emergency Medicine

## 2021-03-24 ENCOUNTER — Emergency Department (HOSPITAL_COMMUNITY): Payer: Medicare Other

## 2021-03-24 DIAGNOSIS — Z79899 Other long term (current) drug therapy: Secondary | ICD-10-CM | POA: Insufficient documentation

## 2021-03-24 DIAGNOSIS — I10 Essential (primary) hypertension: Secondary | ICD-10-CM | POA: Insufficient documentation

## 2021-03-24 DIAGNOSIS — W010XXA Fall on same level from slipping, tripping and stumbling without subsequent striking against object, initial encounter: Secondary | ICD-10-CM | POA: Diagnosis not present

## 2021-03-24 DIAGNOSIS — S01511A Laceration without foreign body of lip, initial encounter: Secondary | ICD-10-CM | POA: Diagnosis not present

## 2021-03-24 DIAGNOSIS — F0390 Unspecified dementia without behavioral disturbance: Secondary | ICD-10-CM | POA: Diagnosis not present

## 2021-03-24 DIAGNOSIS — M25552 Pain in left hip: Secondary | ICD-10-CM | POA: Diagnosis not present

## 2021-03-24 DIAGNOSIS — S0993XA Unspecified injury of face, initial encounter: Secondary | ICD-10-CM | POA: Diagnosis present

## 2021-03-24 DIAGNOSIS — Y92009 Unspecified place in unspecified non-institutional (private) residence as the place of occurrence of the external cause: Secondary | ICD-10-CM | POA: Insufficient documentation

## 2021-03-24 DIAGNOSIS — Y9301 Activity, walking, marching and hiking: Secondary | ICD-10-CM | POA: Insufficient documentation

## 2021-03-24 DIAGNOSIS — M25562 Pain in left knee: Secondary | ICD-10-CM | POA: Diagnosis not present

## 2021-03-24 DIAGNOSIS — M79642 Pain in left hand: Secondary | ICD-10-CM | POA: Diagnosis not present

## 2021-03-24 DIAGNOSIS — M25512 Pain in left shoulder: Secondary | ICD-10-CM | POA: Insufficient documentation

## 2021-03-24 DIAGNOSIS — Z23 Encounter for immunization: Secondary | ICD-10-CM | POA: Diagnosis not present

## 2021-03-24 DIAGNOSIS — M25532 Pain in left wrist: Secondary | ICD-10-CM | POA: Diagnosis not present

## 2021-03-24 DIAGNOSIS — Z043 Encounter for examination and observation following other accident: Secondary | ICD-10-CM | POA: Diagnosis not present

## 2021-03-24 DIAGNOSIS — M25561 Pain in right knee: Secondary | ICD-10-CM | POA: Insufficient documentation

## 2021-03-24 DIAGNOSIS — W19XXXA Unspecified fall, initial encounter: Secondary | ICD-10-CM

## 2021-03-24 DIAGNOSIS — R519 Headache, unspecified: Secondary | ICD-10-CM | POA: Diagnosis not present

## 2021-03-24 MED ORDER — ACETAMINOPHEN 325 MG PO TABS: 650.0000 mg | ORAL_TABLET | Freq: Four times a day (QID) | ORAL | 0 refills | Status: AC | PRN

## 2021-03-24 MED ORDER — OXYCODONE-ACETAMINOPHEN 5-325 MG PO TABS
1.0000 | ORAL_TABLET | Freq: Once | ORAL | Status: AC
Start: 2021-03-24 — End: 2021-03-24
  Administered 2021-03-24: 1 via ORAL
  Filled 2021-03-24: qty 1

## 2021-03-24 MED ORDER — OXYCODONE HCL 5 MG PO TABS: 12.0000 mg | ORAL_TABLET | Freq: Four times a day (QID) | ORAL | 0 refills | Status: DC | PRN

## 2021-03-24 MED ORDER — ONDANSETRON 4 MG PO TBDP
4.0000 mg | ORAL_TABLET | Freq: Once | ORAL | Status: AC
  Administered 2021-03-24: 4 mg via ORAL
  Filled 2021-03-24: qty 1

## 2021-03-24 MED ORDER — CHLORHEXIDINE GLUCONATE 0.12 % MT SOLN: 15.0000 mL | Freq: Two times a day (BID) | OROMUCOSAL | 0 refills | Status: DC

## 2021-03-24 MED ORDER — OXYCODONE-ACETAMINOPHEN 5-325 MG PO TABS: 1.0000 | ORAL_TABLET | Freq: Three times a day (TID) | ORAL | 0 refills | Status: DC | PRN

## 2021-03-24 MED ORDER — TETANUS-DIPHTH-ACELL PERTUSSIS 5-2.5-18.5 LF-MCG/0.5 IM SUSY
0.5000 mL | PREFILLED_SYRINGE | Freq: Once | INTRAMUSCULAR | Status: AC
  Administered 2021-03-24: 0.5 mL via INTRAMUSCULAR
  Filled 2021-03-24: qty 0.5

## 2021-03-24 NOTE — ED Notes (Signed)
PT to X-ray

## 2021-03-24 NOTE — ED Provider Notes (Signed)
[facility] EMERGENCY DEPARTMENT Provider Note   CSN: [id number] Arrival date & time: 03/24/21  6004     History Chief Complaint  Patient presents with  . Fall    [name] is a 70 y.o. female with PMH/o Dementia, HTN, seizure, high cholesterol who presents for evaluation after a fall that occurred this morning. Patient reports she was walking around in the neighborhood when she thinks she tripped over her feet, causing her to fall forward.  She does not think she hit her head or had any LOC.  She states she did not have any seizure that caused her to fall.  She has been able to get up and ambulate since this happened.  Patient denies any preceding chest pain or dizziness that caused her to fall.  On ED arrival, she is complaining of pain to her lip, left shoulder, left hand, right knee as well as left hip.  She is not on blood thinners.  She denies any neck pain, back pain, chest pain, difficulty breathing, abdominal pain, numbness/weakness of arms or legs.  The history is provided by the patient.       Past Medical History:  Diagnosis Date  . Cataract   . Dementia ([place])   . Depression    denies  . Displaced fracture    left ring finger and small finger proximal phalanx  . Epilepsy, grand mal ([place])    "last seizure ~ 2001" (06/16/2015)  . High cholesterol   . Hypertension   . Migraine    "just about qd";  treated by Dr. [name] (10/15/2016)  . Migraines   . Pneumonia    h/o  . Pre-diabetes    she reports that she never took meds. for prediabetes, states MD has told her that she has corrected her situation with diet   . Seizures ([place])    "at one time; stopped in the 1990s" (10/15/2016)  . Stress at home    Pt. reports that she has a lot of personal stress & she is aware that it might being playing a part in her Migraine heaaches      Patient Active Problem List   Diagnosis Date Noted  . Pain in joint of right hip 05/26/2020  . Pain of left hand  06/19/2019  . Closed fracture of phalanx of finger 12/05/2018  . Fracture of left radius 12/05/2018  . Acute pain of left wrist 12/05/2018  . Encounter for removal of sutures 01/15/2018  . Drug rash 11/15/2017  . Altered mental status 11/08/2017  . Acute encephalopathy 11/07/2017  . Seizure disorder ([place]) 11/07/2017  . Dementia ([place]) 11/07/2017  . Essential hypertension 11/07/2017  . Headaches, cluster 11/07/2017  . AKI (acute kidney injury) ([place]) 11/07/2017  . Polypharmacy 05/15/2017  . Intractable chronic cluster headache 05/01/2017  . Memory change 07/19/2016  . Meningioma ([place]) 01/09/2016  . Depression 11/08/2015  . Abnormal head movements 11/08/2015  . Somnolence, daytime 07/14/2015  . Incisional hernia 06/16/2015  . Intractable chronic migraine without aura and without status migrainosus 09/10/2014  . 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 Chronic headaches 04/01/2013    Past Surgical History:  Procedure Laterality Date  . EYE SURGERY Left 2018   left eye cataract removed  . HERNIA REPAIR    . INCISIONAL HERNIA REPAIR N/A 06/16/2015   Procedure: LAPAROSCOPIC INCISIONAL HERNIA WITH MESH;  Surgeon: Coralie Keens, MD;  Location: [place];  Service: General;  Laterality: N/A;  . [place]  10/15/2016  .  INCISIONAL HERNIA REPAIR N/A 10/15/2016   Procedure: OPEN INCISIONAL HERNIA REPAIR WITH MESH;  Surgeon: Coralie Keens, MD;  Location: [place];  Service: General;  Laterality: N/A;  . INCISIONAL HERNIA REPAIR Left 11/05/2018   open; w/mesh  . INGUINAL HERNIA REPAIR Left 11/05/2018   Procedure: OPEN REPAIR LEFT HERNIA ERAS PATHWAY;  Surgeon: Coralie Keens, MD;  Location: [place];  Service: General;  Laterality: Left;  TAP BLOCK  . INSERTION OF MESH N/A 06/16/2015   Procedure: INSERTION OF MESH;  Surgeon: Coralie Keens, MD;  Location: [place];  Service: General;  Laterality: N/A;  . VAGINAL HYSTERECTOMY  ~ 1992     OB History   No obstetric history on  file.     Family History  Problem Relation Age of Onset  . Lung cancer Mother   . Kidney failure Father   . Hypertension Other     Social History   Tobacco Use  . Smoking status: Never Smoker  . Smokeless tobacco: Never Used  Vaping Use  . Vaping Use: Never used  Substance Use Topics  . Alcohol use: No    Alcohol/week: 0.0 standard drinks  . Drug use: No    Home Medications Prior to Admission medications   Medication Sig Start Date End Date Taking? Authorizing Provider  acetaminophen (TYLENOL) 325 MG tablet Take 2 tablets (650 mg total) by mouth every 6 (six) hours as needed for up to 30 doses for mild pain or moderate pain. 03/24/21  Yes Trifan, Carola Rhine, MD  chlorhexidine (PERIDEX) 0.12 % solution Use as directed 15 mLs in the mouth or throat 2 (two) times daily. 03/24/21  Yes Providence Lanius A, PA-C  oxyCODONE (ROXICODONE) 5 MG immediate release tablet Take 2.5 tablets (12.5 mg total) by mouth every 6 (six) hours as needed for up to 12 doses for severe pain. 03/24/21  Yes Trifan, Carola Rhine, MD  oxyCODONE-acetaminophen (PERCOCET/ROXICET) 5-325 MG tablet Take 1-2 tablets by mouth every 8 (eight) hours as needed for severe pain. 03/24/21  Yes Providence Lanius A, PA-C  amLODipine (NORVASC) 5 MG tablet Take 5 mg by mouth daily.    [provider]  Ascorbic Acid (VITAMIN C) 1000 MG tablet Take 1,000 mg by mouth daily.    [provider]  atorvastatin (LIPITOR) 80 MG tablet Take 80 mg by mouth daily.    [provider]  calcium carbonate (OSCAL) 1500 (600 Ca) MG TABS tablet Take by mouth daily.    [provider]  Cholecalciferol (VITAMIN D-3) 125 MCG (5000 UT) TABS Take by mouth.    [provider]  DEXILANT 60 MG capsule Take 1 capsule by mouth daily. 08/31/20   [provider]  diphenhydrAMINE (BENADRYL) 25 mg capsule Take 25 mg by mouth every 6 (six) hours as needed.    [provider]  divalproex (DEPAKOTE ER) 500 MG 24 hr  tablet TAKE 1 TABLET BY MOUTH EVERY MORNING AND TAKE 2 TABLETS BY MOUTH EVERY EVENING Patient taking differently: Take 500-1,000 mg by mouth See admin instructions. Take 1 tablet (500 mg) by mouth every morning and 2 tablets (1000 mg) at night 10/11/16   Marcial Pacas, MD  Erenumab-aooe (AIMOVIG, 140 MG DOSE, Oriental) Inject 140 mg into the skin every 30 (thirty) days. For migraine prevention    [provider]  ezetimibe (ZETIA) 10 MG tablet Take 1 tablet by mouth daily. 03/10/20   [provider]  ferrous sulfate 325 (65 FE) MG tablet Take 325 mg by  mouth daily with breakfast. Patient not taking: Reported on 09/12/2020    [provider]  HYDROcodone-acetaminophen (NORCO/VICODIN) 5-325 MG tablet Take by mouth. 09/22/20   [provider]  magnesium oxide (MAG-OX) 400 MG tablet Take 500 mg by mouth daily.    [provider]  metFORMIN (GLUCOPHAGE-XR) 500 MG 24 hr tablet Take 500 mg by mouth daily with breakfast. Patient not taking: Reported on 02/06/2021    [provider]  methocarbamol (ROBAXIN) 750 MG tablet  06/14/20   [provider]  Multiple Vitamin (MULTIVITAMIN WITH MINERALS) TABS Take 1 tablet by mouth daily.    [provider]  pyridOXINE (VITAMIN B-6) 100 MG tablet Take 100 mg by mouth daily.    [provider]  SUMAtriptan-naproxen (TREXIMET) 85-500 MG tablet Take 1 tablet by mouth 2 (two) times daily as needed for migraine. Max 2 tab per 24 hours, 1-2 days per week 10/07/18   [provider]  topiramate (TOPAMAX) 100 MG tablet Take 200 mg by mouth daily.  07/08/14   [provider]  traMADol (ULTRAM) 50 MG tablet Take by mouth. Patient not taking: Reported on 11/29/2020 08/25/20   [provider]  Ubrogepant (UBRELVY) 100 MG TABS Take 100 mg by mouth daily as needed (migraines).    [provider]  vitamin B-12 (CYANOCOBALAMIN) 100 MCG tablet Take 100 mcg by mouth daily.    [provider]  zolpidem (AMBIEN) 5 MG tablet Take 5 mg by mouth at bedtime as needed for sleep. Patient not taking: Reported on 11/29/2020    [provider]    Allergies    Aspirin, Nsaids, and Tolmetin  Review of Systems   Review of Systems  Eyes: Negative for visual disturbance.  Respiratory: Negative for cough and shortness of breath.   Cardiovascular: Negative for chest pain.  Gastrointestinal: Negative for abdominal pain, nausea and vomiting.  Musculoskeletal: Negative for back pain and neck pain.       Left shoulder pain Left hip pain Left hand pain Right knee pain  Neurological: Negative for dizziness, weakness, numbness and headaches.  All other systems reviewed and are negative.   Physical Exam Updated Vital Signs BP (!) 130/55   Pulse 68   Temp 98.3 F (36.8 C) (Oral)   Resp 13   SpO2 100%   Physical Exam Vitals and nursing note reviewed.  Constitutional:      Appearance: Normal appearance. She is well-developed.  HENT:     Head: Normocephalic and atraumatic.     Comments: No tenderness to palpation of skull. No deformities or crepitus noted. No open wounds, abrasions or lacerations.  No tenderness noted to the mandible, maxilla, zygomatic arch.    Mouth/Throat:     Comments: 0.75 cm laceration noted to the interior lip.  It does not cross the vermilion border.  Dentition appears to be intact. No intraoral trauma. No laceration noted to the exterior lip.  Eyes:     General: Lids are normal.     Conjunctiva/sclera: Conjunctivae normal.     Pupils: Pupils are equal, round, and reactive to light.     Comments: PERRL. EOMs intact. No nystagmus. No neglect.  No tenderness palpation of the bilateral periorbital region.  No deformity or crepitus noted.  Neck:     Comments: Full flexion/extension and lateral movement of neck fully intact. No bony midline tenderness. No deformities or crepitus.  Cardiovascular:     Rate and Rhythm: Normal rate and regular  rhythm.  Pulses: Normal pulses.     Heart sounds: Normal heart sounds. No murmur heard. No friction rub. No gallop.   Pulmonary:     Effort: Pulmonary effort is normal.     Breath sounds: Normal breath sounds.  Abdominal:     Palpations: Abdomen is soft. Abdomen is not rigid.     Tenderness: There is no abdominal tenderness. There is no guarding.  Musculoskeletal:        General: Normal range of motion.     Cervical back: Full passive range of motion without pain.     Comments: Tenderness palpation of the anterior aspect of the left shoulder.  Limited range of motion secondary to pain.  No deformity or crepitus noted.  No bony tenderness noted to left humerus, left elbow, left forearm. She can flex/extend the elbow without any difficulty but does report that it makes her shoulder hurt.  Tenderness palpation noted diffusely to the left wrist, left hand.  Limited range of motion secondary to pain.  No bony deformity noted to right upper extremity.  Tenderness palpation left hip.  Flexion/extension and internal and external rotation intact.  No bony tenderness in left femur, left knee, left tib-fib.  Tenderness palpation in anterior aspect of right knee with some mild overlying soft tissue swelling, ecchymosis.  Flexion/extension intact.  Negative anterior and posterior drawer test.  No instability noted on varus or valgus stress.  No bony tenderness noted to right hip, right femur, right tib-fib.  Skin:    General: Skin is warm and dry.     Capillary Refill: Capillary refill takes less than 2 seconds.  Neurological:     Mental Status: She is alert and oriented to person, place, and time.     Comments: Cranial nerves III-XII intact Follows commands, Moves all extremities  5/5 strength to BUE and BLE  Sensation intact throughout all major nerve distributions No slurred speech. No facial droop.   Psychiatric:        Speech: Speech normal.     ED Results / Procedures / Treatments    Labs (all labs ordered are listed, but only abnormal results are displayed) Labs Reviewed - No data to display  EKG None  Radiology DG Wrist Complete Left  Result Date: 03/24/2021 CLINICAL DATA:  Pain following fall EXAM: LEFT WRIST - COMPLETE 3+ VIEW COMPARISON:  Left hand radiographs March 24, 2021 FINDINGS: Frontal, oblique, lateral, and ulnar deviation scaphoid images were obtained. Bones are osteoporotic. No fracture or dislocation. There is narrowing of the scaphotrapezial and first carpal-metacarpal joints. No erosion. IMPRESSION: Osteoporosis. No fracture or dislocation. Osteoarthritic change in the scaphotrapezial and first carpal-metacarpal joints. Electronically Signed   By: Lowella Grip III M.D.   On: 03/24/2021 11:41   CT Head Wo Contrast  Result Date: 03/24/2021 CLINICAL DATA:  Pain following recent fall EXAM: CT HEAD WITHOUT CONTRAST TECHNIQUE: Contiguous axial images were obtained from the base of the skull through the vertex without intravenous contrast. COMPARISON:  November 07, 2017 head CT and brain MRI April 07, 2020 FINDINGS: Brain: There is mild diffuse atrophy. There is a calcified mass in the left medial inferior frontal region measuring 1.9 x 1.7 cm consistent with a meningioma, slightly larger compared to prior studies. No associated edema or mass effect. No other mass is seen. A small left convexity meningioma seen on previous MR examination is not appreciable on noncontrast enhanced CT. There is no hemorrhage, extra-axial fluid collection, or midline shift. There is slight decreased attenuation  in the periventricular white matter consistent with small vessel vascular disease. No acute infarct evident. Vascular: No hyperdense vessel. Foci of calcification noted in each carotid siphon region. Skull: Bony calvarium appears intact. Sinuses/Orbits: There is mucosal thickening in several ethmoid air cells. Other visualized paranasal sinuses are clear. Orbits appear symmetric  bilaterally except for evidence of previous cataract removal on the left. Other: Mastoid air cells are clear. IMPRESSION: Left inferior frontal region meningioma, calcified, measuring 1.9 x 1.7 cm, slightly larger than on previous study. No associated mass effect or edema. A second meningioma noted previously on MR in the left frontal convexity region is not appreciable on noncontrast enhanced CT examination. There is atrophy with mild periventricular small vessel disease. No acute infarct. No hemorrhage. There are foci of arterial vascular calcification. There is mucosal thickening in several ethmoid air cells. Electronically Signed   By: Lowella Grip III M.D.   On: 03/24/2021 11:48   DG Shoulder Left  Result Date: 03/24/2021 CLINICAL DATA:  70 year old female status post fall. EXAM: LEFT SHOULDER - 2+ VIEW COMPARISON:  None. FINDINGS: There is no evidence of fracture or dislocation. There is no evidence of arthropathy or other focal bone abnormality. Soft tissues are unremarkable. IMPRESSION: No acute fracture or malalignment. Electronically Signed   By: Ruthann Cancer MD   On: 03/24/2021 11:32   DG Knee Complete 4 Views Right  Result Date: 03/24/2021 CLINICAL DATA:  70 year old female status post fall. EXAM: RIGHT KNEE - COMPLETE 4+ VIEW COMPARISON:  None. FINDINGS: No evidence of fracture, dislocation, or joint effusion. No evidence of arthropathy or other focal bone abnormality. Soft tissues are unremarkable. IMPRESSION: No acute fracture or malalignment. Electronically Signed   By: Ruthann Cancer MD   On: 03/24/2021 11:32   DG Hand Complete Left  Result Date: 03/24/2021 CLINICAL DATA:  Pain following fall EXAM: LEFT HAND - COMPLETE 3+ VIEW COMPARISON:  None. FINDINGS: Frontal, oblique, and lateral views were obtained. Bones are osteoporotic. No acute fracture. There is marked flexion of the third and fourth PIP joints with moderate flexion of the fifth PIP joint. No frank dislocation. There is  severe narrowing of the third, fourth, and fifth PIP joints. Mild narrowing noted in the second, third, fourth, and fifth DIP joints. There is mild osteoarthritic change in the first carpal-metacarpal and scaphotrapezial joints. No erosive change. IMPRESSION: No acute fracture. No frank dislocation, although there is marked flexion of the third and fourth PIP joints with moderate flexion of the fifth PIP joint, likely chronic. Multilevel osteoarthritic change in distal joints, most severe in the third, fourth, and fifth PIP joints. Bones osteoporotic. Electronically Signed   By: Lowella Grip III M.D.   On: 03/24/2021 11:39   DG Hip Unilat W or Wo Pelvis 2-3 Views Left  Result Date: 03/24/2021 CLINICAL DATA:  70 year old female status post fall. EXAM: DG HIP (WITH OR WITHOUT PELVIS) 2-3V LEFT COMPARISON:  None. FINDINGS: There is no evidence of hip fracture or dislocation. There is no evidence of arthropathy or other focal bone abnormality. IMPRESSION: No acute fracture or malalignment. If there is clinical concern for radiographically occult hip fracture, recommend MRI hip for further characterization. Electronically Signed   By: Ruthann Cancer MD   On: 03/24/2021 11:34    Procedures Procedures   Medications Ordered in ED Medications  Tdap (BOOSTRIX) injection 0.5 mL (0.5 mLs Intramuscular Given 03/24/21 1132)  oxyCODONE-acetaminophen (PERCOCET/ROXICET) 5-325 MG per tablet 1 tablet (1 tablet Oral Given 03/24/21 1132)  ondansetron (  ZOFRAN-ODT) disintegrating tablet 4 mg (4 mg Oral Given 03/24/21 1131)    ED Course  I have reviewed the triage vital signs and the nursing notes.  Pertinent labs & imaging results that were available during my care of the patient were reviewed by me and considered in my medical decision making (see chart for details).  Clinical Course as of 03/24/21 1542  Fri Mar 24, 2021  1310 70 yo female here with mechanical fall, left sided pain most focally in her left shoulder.   Small abrasion to lower lip.  CTH negative for acute traumatic injuries.  Xrays of the shoulder and left extremities negative for acute fx.  Her tenderness appears isolated to the head of the humerus.  Plan to place in a sling, ambulate with a cane, and advised ortho f/u.  Her husband is here with her.  They live in a single story house. She is ambulatory at baseline.  Allergic to NSAIDS - we'll give some roxicodone for her significant discomfort, tylenol as well. [MT]    Clinical Course User Index [MT] Wyvonnia Dusky, MD   MDM Rules/Calculators/A&P                          70 year old female who presents for evaluation of fall that occurred prior to ED arrival.  Patient was walking in her neighborhood when she thinks she tripped over her feet, causing her to fall.  She states that she does not think she hit her head or had LOC.  She is not on blood thinners.  She has been able to ambulate since this happened.  She was reporting pain to her left shoulder, left hip, right knee, left hand.  We will plan for imaging for evaluation of any acute bony abnormality.  There is documented history of dementia in her chart.  On my evaluation, she is answering all questions appropriately.  Given her age, as well as the fact that she did have evidence of facial trauma, given her lip laceration, will obtain imaging of her head.  CT head shows a left inferior frontal region meningioma that is slightly larger from previous study.  No other acute abnormalities.  Wrist x-ray negative for any acute fracture or dislocation.  Hand x-ray shows no acute fracture or frank dislocation.  She has flexion of the third and fourth PIPs that is likely chronic.  Hip x-ray negative for any acute fracture or bony abnormality.  Shoulder x-ray negative for any acute bony abnormality.  Knee x-ray negative for any acute bony abnormality.   Discussed results with both patient and husband.  They are aware of prior existing meningioma.  She  reports some improvement after pain medication here in the ED.  Repeat examination shows no tenderness noted to the distal humerus, elbow, forearm.  Most of her pain seems to be in the shoulder.  We discussed at length regarding that there could still be a ligament/musculoskeletal injury that is contributing to her pain which may be what is going on since most of her pain is with movement.  We will plan to put her in a sling for support and stabilization.  Additionally, I discussed with her that given the small lip laceration that is anterior, no indication for repair.  We will plan to send her home on Peridex.  Will give patient outpatient orthopedics if symptoms do not improve, she can follow-up with them.  We will also give her a short course  of pain medication for acute/breakthrough pain.  Patient was able to ambulate on the hip without any difficulty.  She also reports that she has been ambulatory since the fall.  At this time, do not feel that she needs further imaging to rule out occult fracture. At this time, patient exhibits no emergent life-threatening condition that require further evaluation in ED. Discussed patient with Dr. Langston Masker who is agreeable. Patient had ample opportunity for questions and discussion. All patient's questions were answered with full understanding. Strict return precautions discussed. Patient expresses understanding and agreement to plan.   Portions of this note were generated with [employer]. Dictation errors may occur despite best attempts at proofreading.  Final Clinical Impression(s) / ED Diagnoses Final diagnoses:  Lip laceration, initial encounter  Fall, initial encounter  Acute pain of left shoulder    Rx / DC Orders ED Discharge Orders         Ordered    oxyCODONE (ROXICODONE) 5 MG immediate release tablet  Every 6 hours PRN        03/24/21 1256    acetaminophen (TYLENOL) 325 MG tablet  Every 6 hours PRN        03/24/21 1256     chlorhexidine (PERIDEX) 0.12 % solution  2 times daily        03/24/21 1402    oxyCODONE-acetaminophen (PERCOCET/ROXICET) 5-325 MG tablet  Every 8 hours PRN        03/24/21 1402           Volanda Napoleon, PA-C 03/24/21 1542    Wyvonnia Dusky, MD 03/24/21 1755

## 2021-03-24 NOTE — Discharge Instructions (Addendum)
Your Xray today did not show any broken bones. As we discussed there could still be a ligamentous injury or a musculoskeletal injury.   You can wear the sling for support and stabilization.  Use Peridex after every meal.   Take pain medications as directed for break through pain. Do not drive or operate machinery while taking this medication.   As we discussed, your CT scan did show the meningioma that had been previously seen.  If you do not have any improvement after few weeks with your shoulder, or follow-up with referred orthopedic doctor.

## 2021-03-24 NOTE — ED Notes (Signed)
Pt ambulated with stand-by assistance.  Able to ambulated with minimal difficulty.

## 2021-03-24 NOTE — ED Triage Notes (Signed)
Pt BIB [facility] due to fall. Pt reports she was walking in the neighborhood when she tripped and fell. Pt denies LOC, Pt denies hitting her head. Pt reports chin pain, left leg.

## 2021-03-27 DIAGNOSIS — S46012A Strain of muscle(s) and tendon(s) of the rotator cuff of left shoulder, initial encounter: Secondary | ICD-10-CM | POA: Diagnosis not present

## 2021-03-30 ENCOUNTER — Other Ambulatory Visit: Payer: Self-pay | Admitting: Radiation Therapy

## 2021-03-30 DIAGNOSIS — M7542 Impingement syndrome of left shoulder: Secondary | ICD-10-CM | POA: Diagnosis not present

## 2021-04-03 DIAGNOSIS — M25512 Pain in left shoulder: Secondary | ICD-10-CM | POA: Diagnosis not present

## 2021-04-05 DIAGNOSIS — M25512 Pain in left shoulder: Secondary | ICD-10-CM | POA: Diagnosis not present

## 2021-04-12 DIAGNOSIS — M25512 Pain in left shoulder: Secondary | ICD-10-CM | POA: Diagnosis not present

## 2021-04-25 DIAGNOSIS — H43813 Vitreous degeneration, bilateral: Secondary | ICD-10-CM | POA: Diagnosis not present

## 2021-04-25 DIAGNOSIS — H25041 Posterior subcapsular polar age-related cataract, right eye: Secondary | ICD-10-CM | POA: Diagnosis not present

## 2021-04-25 DIAGNOSIS — H2511 Age-related nuclear cataract, right eye: Secondary | ICD-10-CM | POA: Diagnosis not present

## 2021-04-25 DIAGNOSIS — H25011 Cortical age-related cataract, right eye: Secondary | ICD-10-CM | POA: Diagnosis not present

## 2021-04-27 ENCOUNTER — Ambulatory Visit
Admission: RE | Admit: 2021-04-27 | Discharge: 2021-04-27 | Disposition: A | Discharge status: Home / Self Care | Payer: Medicare Other | Source: Ambulatory Visit | Attending: Internal Medicine | Admitting: Internal Medicine

## 2021-04-27 ENCOUNTER — Other Ambulatory Visit: Payer: Self-pay

## 2021-04-27 DIAGNOSIS — D329 Benign neoplasm of meninges, unspecified: Secondary | ICD-10-CM | POA: Diagnosis not present

## 2021-04-27 MED ORDER — GADOBENATE DIMEGLUMINE 529 MG/ML IV SOLN
9.0000 mL | Freq: Once | INTRAVENOUS | Status: AC | PRN
  Administered 2021-04-27: 9 mL via INTRAVENOUS

## 2021-04-28 DIAGNOSIS — R32 Unspecified urinary incontinence: Secondary | ICD-10-CM | POA: Diagnosis not present

## 2021-05-01 ENCOUNTER — Inpatient Hospital Stay: Payer: Medicare Other | Attending: Internal Medicine

## 2021-05-01 DIAGNOSIS — Z79899 Other long term (current) drug therapy: Secondary | ICD-10-CM | POA: Insufficient documentation

## 2021-05-01 DIAGNOSIS — Z801 Family history of malignant neoplasm of trachea, bronchus and lung: Secondary | ICD-10-CM | POA: Insufficient documentation

## 2021-05-01 DIAGNOSIS — Z8249 Family history of ischemic heart disease and other diseases of the circulatory system: Secondary | ICD-10-CM | POA: Insufficient documentation

## 2021-05-01 DIAGNOSIS — D320 Benign neoplasm of cerebral meninges: Secondary | ICD-10-CM | POA: Insufficient documentation

## 2021-05-01 DIAGNOSIS — Z886 Allergy status to analgesic agent status: Secondary | ICD-10-CM | POA: Insufficient documentation

## 2021-05-01 DIAGNOSIS — Z841 Family history of disorders of kidney and ureter: Secondary | ICD-10-CM | POA: Insufficient documentation

## 2021-05-02 ENCOUNTER — Inpatient Hospital Stay: Payer: Medicare Other | Admitting: Internal Medicine

## 2021-05-04 ENCOUNTER — Inpatient Hospital Stay (HOSPITAL_BASED_OUTPATIENT_CLINIC_OR_DEPARTMENT_OTHER): Payer: Medicare Other | Admitting: Internal Medicine

## 2021-05-04 ENCOUNTER — Other Ambulatory Visit: Payer: Self-pay

## 2021-05-04 VITALS — BP 139/46 | HR 82 | Temp 97.7°F | Resp 16 | Ht 60.0 in | Wt 115.6 lb

## 2021-05-04 DIAGNOSIS — D329 Benign neoplasm of meninges, unspecified: Secondary | ICD-10-CM

## 2021-05-04 DIAGNOSIS — Z79899 Other long term (current) drug therapy: Secondary | ICD-10-CM | POA: Diagnosis not present

## 2021-05-04 DIAGNOSIS — Z8249 Family history of ischemic heart disease and other diseases of the circulatory system: Secondary | ICD-10-CM | POA: Diagnosis not present

## 2021-05-04 DIAGNOSIS — Z801 Family history of malignant neoplasm of trachea, bronchus and lung: Secondary | ICD-10-CM | POA: Diagnosis not present

## 2021-05-04 DIAGNOSIS — Z841 Family history of disorders of kidney and ureter: Secondary | ICD-10-CM | POA: Diagnosis not present

## 2021-05-04 DIAGNOSIS — D320 Benign neoplasm of cerebral meninges: Secondary | ICD-10-CM | POA: Diagnosis not present

## 2021-05-04 DIAGNOSIS — Z886 Allergy status to analgesic agent status: Secondary | ICD-10-CM | POA: Diagnosis not present

## 2021-05-04 NOTE — Progress Notes (Signed)
[facility] at [place], [place] [address] [phone]   Interval Evaluation  Date of Service: 05/04/21 Patient Name: [name] Patient MRN: [id number] Patient DOB: 1951-10-24 Provider: Ventura Sellers, MD  Identifying Statement:  [name] is a 70 y.o. female with left frontal meningioma   Interval History:  [name] presents today following recent MRI brain.  She denies new or progressive neurologic deficits today.  Continues to have occassional migraines, although typically they are of milder nature lately.  Denies seizures. Medications: Current Outpatient Medications on File Prior to Visit  Medication Sig Dispense Refill  . acetaminophen (TYLENOL) 325 MG tablet Take 2 tablets (650 mg total) by mouth every 6 (six) hours as needed for up to 30 doses for mild pain or moderate pain. 30 tablet 0  . amLODipine (NORVASC) 5 MG tablet Take 5 mg by mouth daily.    . Ascorbic Acid (VITAMIN C) 1000 MG tablet Take 1,000 mg by mouth daily.    Marland Kitchen atorvastatin (LIPITOR) 80 MG tablet Take 80 mg by mouth daily.    . calcium carbonate (OSCAL) 1500 (600 Ca) MG TABS tablet Take by mouth daily.    . chlorhexidine (PERIDEX) 0.12 % solution Use as directed 15 mLs in the mouth or throat 2 (two) times daily. 120 mL 0  . Cholecalciferol (VITAMIN D-3) 125 MCG (5000 UT) TABS Take by mouth.    . DEXILANT 60 MG capsule Take 1 capsule by mouth daily.    . diphenhydrAMINE (BENADRYL) 25 mg capsule Take 25 mg by mouth every 6 (six) hours as needed.    . divalproex (DEPAKOTE ER) 500 MG 24 hr tablet TAKE 1 TABLET BY MOUTH EVERY MORNING AND TAKE 2 TABLETS BY MOUTH EVERY EVENING (Patient taking differently: Take 500-1,000 mg by mouth See admin instructions. Take 1 tablet (500 mg) by mouth every morning and 2 tablets (1000 mg) at night) 270 tablet 3  . Erenumab-aooe (AIMOVIG, 140 MG DOSE, [place]) Inject 140 mg into the skin every 30 (thirty) days. For migraine  prevention    . ezetimibe (ZETIA) 10 MG tablet Take 1 tablet by mouth daily.    . ferrous sulfate 325 (65 FE) MG tablet Take 325 mg by mouth daily with breakfast. (Patient not taking: Reported on 09/12/2020)    . HYDROcodone-acetaminophen (NORCO/VICODIN) 5-325 MG tablet Take by mouth.    . magnesium oxide (MAG-OX) 400 MG tablet Take 500 mg by mouth daily.    . metFORMIN (GLUCOPHAGE-XR) 500 MG 24 hr tablet Take 500 mg by mouth daily with breakfast. (Patient not taking: Reported on 02/06/2021)    . methocarbamol (ROBAXIN) 750 MG tablet     . Multiple Vitamin (MULTIVITAMIN WITH MINERALS) TABS Take 1 tablet by mouth daily.    Marland Kitchen oxyCODONE (ROXICODONE) 5 MG immediate release tablet Take 2.5 tablets (12.5 mg total) by mouth every 6 (six) hours as needed for up to 12 doses for severe pain. 30 tablet 0  . oxyCODONE-acetaminophen (PERCOCET/ROXICET) 5-325 MG tablet Take 1-2 tablets by mouth every 8 (eight) hours as needed for severe pain. 6 tablet 0  . pyridOXINE (VITAMIN B-6) 100 MG tablet Take 100 mg by mouth daily.    . SUMAtriptan-naproxen (TREXIMET) 85-500 MG tablet Take 1 tablet by mouth 2 (two) times daily as needed for migraine. Max 2 tab per 24 hours, 1-2 days per week  3  . topiramate (TOPAMAX) 100 MG tablet Take 200 mg by  mouth daily.     . traMADol (ULTRAM) 50 MG tablet Take by mouth. (Patient not taking: Reported on 11/29/2020)    . Ubrogepant (UBRELVY) 100 MG TABS Take 100 mg by mouth daily as needed (migraines).    . vitamin B-12 (CYANOCOBALAMIN) 100 MCG tablet Take 100 mcg by mouth daily.    Marland Kitchen zolpidem (AMBIEN) 5 MG tablet Take 5 mg by mouth at bedtime as needed for sleep. (Patient not taking: Reported on 11/29/2020)     No current facility-administered medications on file prior to visit.    Allergies:  Allergies  Allergen Reactions  . Aspirin Nausea Only and Other (See Comments)    ulcers   . Nsaids Other (See Comments)    Can cause ulcers  . Tolmetin Other (See Comments)    Can cause  ulcers   Past Medical History:  Past Medical History:  Diagnosis Date  . Cataract   . Dementia ([place])   . Depression    denies  . Displaced fracture    left ring finger and small finger proximal phalanx  . Epilepsy, grand mal ([place])    "last seizure ~ 2001" (06/16/2015)  . High cholesterol   . Hypertension   . Migraine    "just about qd";  treated by Dr. [name] (10/15/2016)  . Migraines   . Pneumonia    h/o  . Pre-diabetes    she reports that she never took meds. for prediabetes, states MD has told her that she has corrected her situation with diet   . Seizures ([place])    "at one time; stopped in the 1990s" (10/15/2016)  . Stress at home    Pt. reports that she has a lot of personal stress & she is aware that it might being playing a part in her Migraine heaaches     Past Surgical History:  Past Surgical History:  Procedure Laterality Date  . EYE SURGERY Left 2018   left eye cataract removed  . HERNIA REPAIR    . INCISIONAL HERNIA REPAIR N/A 06/16/2015   Procedure: LAPAROSCOPIC INCISIONAL HERNIA WITH MESH;  Surgeon: Coralie Keens, MD;  Location: [facility];  Service: General;  Laterality: N/A;  . [place]  10/15/2016  . INCISIONAL HERNIA REPAIR N/A 10/15/2016   Procedure: OPEN INCISIONAL HERNIA REPAIR WITH MESH;  Surgeon: Coralie Keens, MD;  Location: [facility];  Service: General;  Laterality: N/A;  . INCISIONAL HERNIA REPAIR Left 11/05/2018   open; w/mesh  . INGUINAL HERNIA REPAIR Left 11/05/2018   Procedure: OPEN REPAIR LEFT HERNIA ERAS PATHWAY;  Surgeon: Coralie Keens, MD;  Location: [facility];  Service: General;  Laterality: Left;  TAP BLOCK  . INSERTION OF MESH N/A 06/16/2015   Procedure: INSERTION OF MESH;  Surgeon: Coralie Keens, MD;  Location: [facility];  Service: General;  Laterality: N/A;  . VAGINAL HYSTERECTOMY  ~ 1992   Social History:  Social History   Socioeconomic History  . Marital status: Married    Spouse name: Elenore Rota  . Number of children: 3   . Years of education: college  . Highest education level: Not on file  Occupational History    Comment: does not work  Tobacco Use  . Smoking status: Never Smoker  . Smokeless tobacco: Never Used  Vaping Use  . Vaping Use: Never used  Substance and Sexual Activity  . Alcohol use: No    Alcohol/week: 0.0 standard drinks  . Drug use: No  . Sexual activity: Yes  Other Topics Concern  .  Not on file  Social History Narrative   Patient lives at home with her husband Elenore Rota). Patient was a homemaker.   Right handed.   College education.   Caffeine- One cup of coffee daily.   Patient has three children.   Social Determinants of Health   Financial Resource Strain: Low Risk   . Difficulty of Paying Living Expenses: Not hard at all  Food Insecurity: No Food Insecurity  . Worried About Charity fundraiser in the Last Year: Never true  . Ran Out of Food in the Last Year: Never true  Transportation Needs: No Transportation Needs  . Lack of Transportation (Medical): No  . Lack of Transportation (Non-Medical): No  Physical Activity: Not on file  Stress: Not on file  Social Connections: Not on file  Intimate Partner Violence: Not At Risk  . Fear of Current or Ex-Partner: No  . Emotionally Abused: No  . Physically Abused: No  . Sexually Abused: No   Family History:  Family History  Problem Relation Age of Onset  . Lung cancer Mother   . Kidney failure Father   . Hypertension Other     Review of Systems: Constitutional: Doesn't report fevers, chills or abnormal weight loss Eyes: Doesn't report blurriness of vision Ears, nose, mouth, throat, and face: Doesn't report sore throat Respiratory: Doesn't report cough, dyspnea or wheezes Cardiovascular: Doesn't report palpitation, chest discomfort  Gastrointestinal:  Doesn't report nausea, constipation, diarrhea GU: Doesn't report incontinence Skin: Doesn't report skin rashes Neurological: Per HPI Musculoskeletal: Doesn't report  joint pain Behavioral/Psych: Doesn't report anxiety  Physical Exam: Vitals:   05/04/21 1110  BP: (!) 139/46  Pulse: 82  Resp: 16  Temp: 97.7 F (36.5 C)  SpO2: 100%   KPS: 90. General: Alert, cooperative, pleasant, in no acute distress Head: Normal EENT: No conjunctival injection or scleral icterus.  Lungs: Resp effort normal Cardiac: Regular rate Abdomen: Non-distended abdomen Skin: No rashes cyanosis or petechiae. Extremities: No clubbing or edema  Neurologic Exam: Mental Status: Awake, alert, attentive to examiner. Oriented to self and environment. Language is fluent with intact comprehension.  Cranial Nerves: Visual acuity is grossly normal. Visual fields are full. Extra-ocular movements intact. No ptosis. Face is symmetric Motor: Tone and bulk are normal. Power is full in both arms and legs. Reflexes are symmetric, no pathologic reflexes present.  Sensory: Intact to light touch Gait: Normal.   Labs: I have reviewed the data as listed    Component Value Date/Time   NA 142 11/21/2019 1549   K 3.8 11/21/2019 1549   CL 111 11/21/2019 1549   CO2 23 11/21/2019 1549   GLUCOSE 85 11/21/2019 1549   BUN 12 11/21/2019 1549   CREATININE 0.81 11/21/2019 1549   CALCIUM 8.9 11/21/2019 1549   PROT 7.4 08/04/2018 1645   ALBUMIN 4.1 08/04/2018 1645   AST 31 08/04/2018 1645   ALT 25 08/04/2018 1645   ALKPHOS 53 08/04/2018 1645   BILITOT 0.5 08/04/2018 1645   GFRNONAA >60 11/21/2019 1549   GFRAA >60 11/21/2019 1549   Lab Results  Component Value Date   WBC 9.3 11/21/2019   NEUTROABS 4.9 11/21/2019   HGB 11.5 (L) 11/21/2019   HCT 37.6 11/21/2019   MCV 97.2 11/21/2019   PLT 213 11/21/2019    Imaging: [place] Clinician Interpretation: I have personally reviewed the CNS images as listed.  My interpretation, in the context of the patient's clinical presentation, is progressive disease  MR BRAIN W WO CONTRAST  Result  Date: 04/28/2021 CLINICAL DATA:  70 year old female with  chronic meningiomas, mildly enlarged since 2018 on MRI last year. Restaging. EXAM: MRI HEAD WITHOUT AND WITH CONTRAST TECHNIQUE: Multiplanar, multiecho pulse sequences of the brain and surrounding structures were obtained without and with intravenous contrast. CONTRAST:  55mL MULTIHANCE GADOBENATE DIMEGLUMINE 529 MG/ML IV SOLN COMPARISON:  Brain MRI 04/07/2020 (noncontrast) and earlier. Most recent contrast enhanced exam was 12/21/2015. FINDINGS: Brain: Chronic left planum sphenoidale meningioma enhances homogeneously and is now 12 x 14 by 8 mm (AP by transverse by CC) in 2016 this was 11 x 12 by 7 mm. Left posterior frontal convexity meningioma now measures 11 by 5 x 11 mm. In 2016 this was 12 x 5 x 11 mm when measured in the same way. Minimal mass effect associated with both lesions. And no associated cerebral edema. No superimposed No restricted diffusion to suggest acute infarction. No midline shift, mass effect, evidence of mass lesion, ventriculomegaly, extra-axial collection or acute intracranial hemorrhage. Cervicomedullary junction and pituitary are within normal limits. Dural no other abnormal intracranial enhancement thickening identified. Or Pearline Cables and white matter signal throughout the brain is not significantly changed since 2016, within normal limits for age. No cortical encephalomalacia or chronic cerebral blood products identified. However, there does appear to be some chronic disproportionate atrophy of the inferior cerebellum such as on series 5, image 19. Vascular: Major intracranial vascular flow voids are stable. Major dural venous sinuses are enhancing and appear to be patent. Skull and upper cervical spine: Negative. Visualized bone marrow signal is within normal limits. Sinuses/Orbits: Postoperative changes to the left globe. Otherwise negative. Other: Mastoids remain clear. Visible internal auditory structures appear normal. Negative visible scalp and face. IMPRESSION: 1. Contrast enhanced  imaging demonstrates that two small chronic meningiomas are minimally larger since 2016: - left planum sphenoidale lesion was 12 mm,  now 14 mm and - left posterior frontal convexity lesion was 11 mm, now 12 mm. No significant mass effect or associated cerebral edema. 2. No acute intracranial abnormality. Suggestion of chronic disproportionate atrophy of the inferior cerebellar folia. Electronically Signed   By: Genevie Ann M.D.   On: 04/28/2021 10:04     Assessment/Plan Meningioma [facility]) [D32.9]   CYLEE DATTILO is clinically stable today.  MRI demonstrates very slow growth of left frontal meningioma, and questionable change within left convexity meningioma.    Can continue to monitor radiographically, defer surgical intervention due to small size and slow growth rate.  We ask that Thea Alken return to clinic in 12 months following next brain MRI, or sooner as needed.  All questions were answered. The patient knows to call the clinic with any problems, questions or concerns. No barriers to learning were detected.  The total time spent in the encounter was 30 minutes and more than 50% was on counseling and review of test results   Ventura Sellers, MD Medical Director of Neuro-Oncology [facility] at [place] 05/04/21 10:55 AM

## 2021-05-23 DIAGNOSIS — M25512 Pain in left shoulder: Secondary | ICD-10-CM | POA: Diagnosis not present

## 2021-05-24 DIAGNOSIS — M79672 Pain in left foot: Secondary | ICD-10-CM | POA: Diagnosis not present

## 2021-05-24 DIAGNOSIS — M65871 Other synovitis and tenosynovitis, right ankle and foot: Secondary | ICD-10-CM | POA: Diagnosis not present

## 2021-05-24 DIAGNOSIS — M21611 Bunion of right foot: Secondary | ICD-10-CM | POA: Diagnosis not present

## 2021-05-24 DIAGNOSIS — M7751 Other enthesopathy of right foot: Secondary | ICD-10-CM | POA: Diagnosis not present

## 2021-05-24 DIAGNOSIS — M79671 Pain in right foot: Secondary | ICD-10-CM | POA: Diagnosis not present

## 2021-06-06 DIAGNOSIS — M25612 Stiffness of left shoulder, not elsewhere classified: Secondary | ICD-10-CM | POA: Diagnosis not present

## 2021-06-06 DIAGNOSIS — M25512 Pain in left shoulder: Secondary | ICD-10-CM | POA: Diagnosis not present

## 2021-06-07 DIAGNOSIS — M21612 Bunion of left foot: Secondary | ICD-10-CM | POA: Diagnosis not present

## 2021-06-07 DIAGNOSIS — M12271 Villonodular synovitis (pigmented), right ankle and foot: Secondary | ICD-10-CM | POA: Diagnosis not present

## 2021-06-07 DIAGNOSIS — M25571 Pain in right ankle and joints of right foot: Secondary | ICD-10-CM | POA: Diagnosis not present

## 2021-06-07 DIAGNOSIS — M12272 Villonodular synovitis (pigmented), left ankle and foot: Secondary | ICD-10-CM | POA: Diagnosis not present

## 2021-06-07 DIAGNOSIS — M21611 Bunion of right foot: Secondary | ICD-10-CM | POA: Diagnosis not present

## 2021-06-21 DIAGNOSIS — M25571 Pain in right ankle and joints of right foot: Secondary | ICD-10-CM | POA: Diagnosis not present

## 2021-06-21 DIAGNOSIS — M65871 Other synovitis and tenosynovitis, right ankle and foot: Secondary | ICD-10-CM | POA: Diagnosis not present

## 2021-06-22 DIAGNOSIS — H2511 Age-related nuclear cataract, right eye: Secondary | ICD-10-CM | POA: Diagnosis not present

## 2021-06-22 DIAGNOSIS — H25811 Combined forms of age-related cataract, right eye: Secondary | ICD-10-CM | POA: Diagnosis not present

## 2021-06-22 DIAGNOSIS — H25041 Posterior subcapsular polar age-related cataract, right eye: Secondary | ICD-10-CM | POA: Diagnosis not present

## 2021-06-22 DIAGNOSIS — H25011 Cortical age-related cataract, right eye: Secondary | ICD-10-CM | POA: Diagnosis not present

## 2021-07-05 DIAGNOSIS — G43109 Migraine with aura, not intractable, without status migrainosus: Secondary | ICD-10-CM | POA: Diagnosis not present

## 2021-07-11 DIAGNOSIS — Z0000 Encounter for general adult medical examination without abnormal findings: Secondary | ICD-10-CM | POA: Diagnosis not present

## 2021-07-11 DIAGNOSIS — E785 Hyperlipidemia, unspecified: Secondary | ICD-10-CM | POA: Diagnosis not present

## 2021-07-11 DIAGNOSIS — E1169 Type 2 diabetes mellitus with other specified complication: Secondary | ICD-10-CM | POA: Diagnosis not present

## 2021-07-11 DIAGNOSIS — G43909 Migraine, unspecified, not intractable, without status migrainosus: Secondary | ICD-10-CM | POA: Diagnosis not present

## 2021-07-11 DIAGNOSIS — I10 Essential (primary) hypertension: Secondary | ICD-10-CM | POA: Diagnosis not present

## 2021-07-11 DIAGNOSIS — Z1389 Encounter for screening for other disorder: Secondary | ICD-10-CM | POA: Diagnosis not present

## 2021-07-11 DIAGNOSIS — Z1159 Encounter for screening for other viral diseases: Secondary | ICD-10-CM | POA: Diagnosis not present

## 2021-07-11 DIAGNOSIS — K219 Gastro-esophageal reflux disease without esophagitis: Secondary | ICD-10-CM | POA: Diagnosis not present

## 2021-08-07 DIAGNOSIS — R32 Unspecified urinary incontinence: Secondary | ICD-10-CM | POA: Diagnosis not present

## 2021-08-07 DIAGNOSIS — N3001 Acute cystitis with hematuria: Secondary | ICD-10-CM | POA: Diagnosis not present

## 2021-08-09 ENCOUNTER — Other Ambulatory Visit: Payer: Self-pay | Admitting: *Deleted

## 2021-08-09 NOTE — Patient Instructions (Signed)
Goals Addressed             This Visit's Progress    [facility]) Enhance My Mental Skills   On track    Timeframe:  Short-Term Goal Priority:  High Start Date:   11/29/2020                          Expected End Date: [id number]        Follow p outreach [id number]   - do word search or crossword puzzles daily - stay in touch with my family and friends - take a walk daily and think about what I am seeing -discuss memory issues with PCP    Why is this important?   As we age, or sometimes because we have an illness, it feels like our memory and ability to figure things out is not very good.  There are things you can do to keep your memory and your thinking as strong as possible.    Notes:  [id number] Patient is doing puzzles and going to the gym 3 x week.      [facility]) Learn More About My Health   On track    Timeframe:  Long-Range Goal Priority:  Medium Start Date:  11/29/2020                           Expected End Date:  [id number]            - tell my story and reason for my visit - make a list of questions - ask questions - repeat what I heard to make sure I understand - bring a list of my medicines to the visit - speak up when I don't understand    Why is this important?   The best way to learn about your health and care is by talking to the doctor and nurse.  They will answer your questions and give you information in the way that you like best.    Notes:      [facility]) Make and Keep All Appointments   On track    Timeframe:  Short-Term Goal Priority:  High Start Date:   11/29/2020                          Expected End Date:  [id number]                 Follow up outreach date [id number]   - ask family or friend for a ride - call to cancel if needed - keep a calendar with appointment dates -attempt to schedule appointment with PCP as soon as possible to discuss symptoms of anger and memory problems    Why is this important?   Part of staying healthy is seeing the doctor for follow-up  care.  If you forget your appointments, there are some things you can do to stay on track.    Notes:  [id number] Patient is trying to keep health maintenance appointments     [facility]) Manage My Emotions   On track    Timeframe:  Short-Term Goal Priority:  High Start Date:   11/29/2020                          Expected End Date:  [id number] Follow up [id number]                -  begin personal counseling - practice relaxation or meditation daily - talk about feelings with a friend, family or spiritual advisor - practice positive thinking and self-talk -work with [facility] Social Worker for depression resources    Why is this important?   When you are stressed, down or upset, your body reacts too.  For example, your blood pressure may get higher; you may have a headache or stomachache.  When your emotions get the best of you, your body's ability to fight off cold and flu gets weak.  These steps will help you manage your emotions.     Notes:  [id number] Patient is trying to exercise 3 x a week to help with stress.      [facility]) Monitor and Manage My Blood Sugar-Diabetes Type 2   On track    Timeframe:  Long-Range Goal Priority:  Medium Start Date:  11/29/2020                           Expected End Date:  [id number]            - check blood sugar at prescribed times - check blood sugar if I feel it is too high or too low - enter blood sugar readings and medication or insulin into daily log - take the blood sugar log to all doctor visits - take the blood sugar meter to all doctor visits -use fingerstick blood sugars to compare to [facility] when feeling like it is inaccurate    Why is this important?   Checking your blood sugar at home helps to keep it from getting very high or very low.  Writing the results in a diary or log helps the doctor know how to care for you.  Your blood sugar log should have the time, date and the results.  Also, write down the amount of insulin or other medicine that  you take.  Other information, like what you ate, exercise done and how you were feeling, will also be helpful.     Notes:  Patient A1C goal is 5.9 [id number] Patient A1C has increases to 6.6. She is monitoring closely     [facility]) Set My Target A1C-Diabetes Type 2   On track    Timeframe:  Long-Range Goal Priority:  Medium Start Date:   11/29/2020                          Expected End Date:  05/23/2021                          Why is this important?   Your target A1C is decided together by you and your doctor.  It is based on several things like your age and other health issues.    Notes:  A1C goal is 6.6  A1C is 5.9 [id number] A1C is 6.6. Patient is monitoring it closely

## 2021-08-09 NOTE — Patient Outreach (Signed)
[facility]) Care Management  [place]  08/09/2021   [name] 09-08-1951 [id number]  RN Health Coach telephone call to patient.  [employer] compliance verified. Per patient she is doing good. A1C elevated to  6.6. Per patient she has been going to the gym 3 x week. Patient stated that her UTI is better. She has taken medication as per ordered. Appetite is good. Patient has agreed to follow up outreach calls.   Encounter Medications:  Outpatient Encounter Medications as of 08/09/2021  Medication Sig Note   acetaminophen (TYLENOL) 325 MG tablet Take 2 tablets (650 mg total) by mouth every 6 (six) hours as needed for up to 30 doses for mild pain or moderate pain.    amLODipine (NORVASC) 5 MG tablet Take 5 mg by mouth daily.    Ascorbic Acid (VITAMIN C) 1000 MG tablet Take 1,000 mg by mouth daily.    atorvastatin (LIPITOR) 80 MG tablet Take 80 mg by mouth daily.    calcium carbonate (OSCAL) 1500 (600 Ca) MG TABS tablet Take by mouth daily.    chlorhexidine (PERIDEX) 0.12 % solution Use as directed 15 mLs in the mouth or throat 2 (two) times daily.    Cholecalciferol (VITAMIN D-3) 125 MCG (5000 UT) TABS Take by mouth.    DEXILANT 60 MG capsule Take 1 capsule by mouth daily.    diphenhydrAMINE (BENADRYL) 25 mg capsule Take 25 mg by mouth every 6 (six) hours as needed.    divalproex (DEPAKOTE ER) 500 MG 24 hr tablet TAKE 1 TABLET BY MOUTH EVERY MORNING AND TAKE 2 TABLETS BY MOUTH EVERY EVENING (Patient taking differently: Take 500-1,000 mg by mouth See admin instructions. Take 1 tablet (500 mg) by mouth every morning and 2 tablets (1000 mg) at night) 01/23/2020: [facility] states last filled June 2020  (#270) - pt states she is still taking 3 daily and has some at home   Erenumab-aooe (AIMOVIG, 140 MG DOSE, [place]) Inject 140 mg into the skin every 30 (thirty) days. For migraine prevention    ezetimibe (ZETIA) 10 MG tablet Take 1 tablet by mouth daily.    ferrous sulfate 325 (65  FE) MG tablet Take 325 mg by mouth daily with breakfast. (Patient not taking: Reported on 09/12/2020) 09/12/2020: Need to refill   HYDROcodone-acetaminophen (NORCO/VICODIN) 5-325 MG tablet Take by mouth.    magnesium oxide (MAG-OX) 400 MG tablet Take 500 mg by mouth daily.    metFORMIN (GLUCOPHAGE-XR) 500 MG 24 hr tablet Take 500 mg by mouth daily with breakfast. (Patient not taking: Reported on 02/06/2021)    methocarbamol (ROBAXIN) 750 MG tablet  09/12/2020: Takes as needed   Multiple Vitamin (MULTIVITAMIN WITH MINERALS) TABS Take 1 tablet by mouth daily.    oxyCODONE (ROXICODONE) 5 MG immediate release tablet Take 2.5 tablets (12.5 mg total) by mouth every 6 (six) hours as needed for up to 12 doses for severe pain.    oxyCODONE-acetaminophen (PERCOCET/ROXICET) 5-325 MG tablet Take 1-2 tablets by mouth every 8 (eight) hours as needed for severe pain.    pyridOXINE (VITAMIN B-6) 100 MG tablet Take 100 mg by mouth daily.    SUMAtriptan-naproxen (TREXIMET) 85-500 MG tablet Take 1 tablet by mouth 2 (two) times daily as needed for migraine. Max 2 tab per 24 hours, 1-2 days per week    topiramate (TOPAMAX) 100 MG tablet Take 200 mg by mouth daily.     traMADol (ULTRAM) 50 MG tablet Take by mouth. (Patient not taking: Reported  on 11/29/2020) 11/29/2020: Reports not taking   Ubrogepant (UBRELVY) 100 MG TABS Take 100 mg by mouth daily as needed (migraines).    vitamin B-12 (CYANOCOBALAMIN) 100 MCG tablet Take 100 mcg by mouth daily.    zolpidem (AMBIEN) 5 MG tablet Take 5 mg by mouth at bedtime as needed for sleep. (Patient not taking: Reported on 11/29/2020) 11/29/2020: Reports not taking   No facility-administered encounter medications on file as of 08/09/2021.    Functional Status:  No flowsheet data found.  Fall/Depression Screening: Fall Risk  08/09/2021 11/29/2020 09/12/2020  Falls in the past year? 1 1 0  Comment - - -  Number falls in past yr: 1 0 0  Comment - [place] out of bed on Thanksgiving 2021 -   Injury with Fall? 1 0 0  Comment hurt shoulder and broke tooth - -  Risk for fall due to : History of fall(s);Impaired balance/gait;Impaired vision History of fall(s);Impaired vision;Medication side effect History of fall(s);Impaired vision;Medication side effect  Follow up Falls evaluation completed;Education provided Falls evaluation completed;Education provided;Falls prevention discussed Falls evaluation completed;Education provided;Falls prevention discussed   PHQ 2/9 Scores 12/01/2020 11/29/2020 11/26/2019 10/28/2019 06/02/2014  PHQ - 2 Score 2 3 0 0 0  PHQ- 9 Score 6 7 - - -    Assessment:   Care Plan Care Plan : Wellness (Adult)  Updates made by Hiroshi Krummel, Eppie Gibson, RN since 08/09/2021 12:00 AM     Problem: Health Literacy (Wellness)   Priority: Medium     Long-Range Goal: Health Literacy Improved   Start Date: 11/29/2020  Expected End Date: 08/22/2021  This Visit's Progress: On track  Priority: Medium  Note:   Evidence-based guidance:  Assess health literacy using Institute of Medicine recommended questions or standardized tools.  Use a universal method with health literacy to esure a nonjudgmental approach to varying levels of literacy.  Identify barriers to seeking or understanding information.  Note: Patient barriers may include literacy, language or culture, mental health, stigma, embarrassment; clinician barriers may include avoidance, time constraint, stereotype or bias.  Collaborate with interprofessional team and child and parent/caregiver to develop a structured yet individualized education plan that supports shared decision-making.  Consider a combination of strategies such as print, audiotape, video and computer-based learning in a group or individual setting.  Encourage patient to seek health information and education in the community such as Art therapist, continuing education, English as a second language class, support group or peer Engineer, maintenance.  Provide educational  materials that are written in plain language (3rd to 5th grade reading level) and include icons, pictures and tables; provide essential information first.   Notes:     Task: Identify Deficit and Optimize Health Literacy   Due Date: 03/22/2022  Note:   Care Management Activities:    - education plan reviewed and/or amended - empathy and reassurance conveyed - family/caregiver participation in learning encouraged - health literacy screen reviewed - patient's preferred learning methods utilized - privacy ensured - questions encouraged - readiness to learn monitored    Notes:  [id number] RN sent educational material on UTI    Problem: Cognitive Function (Wellness)   Priority: High     Goal: Cognitive Function Enhanced   Start Date: 11/29/2020  Expected End Date: 03/22/2022  Recent Progress: On track  Priority: Medium  Note:   Evidence-based guidance:  Assess changes in cognitive function using standardized assessment when available.  Encourage and refer for services that stimulate thinking, problem-solving and memory, such as cognitive training  and cognitive rehabilitation.  Encourage physical activity or exercise based on ability and tolerance.  Encourage enjoyable activities that provide general stimulation for thinking, concentration and memory in a social setting or small group.   Notes:     Task: Identify and Optimize Mental Processes   Due Date: 03/22/2022  Note:   Care Management Activities:    - caregiver involvement in care and education encouraged - cognitive-stimulating activities promoted - consistent daily routine encouraged - glasses use encouraged - medication list reviewed - regular activity or exercise promoted - social relationships promoted    Notes: Encouraged to continue with outpatient therapy on hand and for activity    Care Plan : Diabetes Type 2 (Adult)  Updates made by Verlin Grills, RN since 08/09/2021 12:00 AM     Problem: Glycemic  Management (Diabetes, Type 2)   Priority: Medium     Long-Range Goal: Glycemic Management Optimized   Start Date: 08/09/2021  Expected End Date: 03/22/2022  This Visit's Progress: On track  Priority: Medium  Note:   Evidence-based guidance:  Anticipate A1C testing (point-of-care) every 3 to 6 months based on goal attainment.  Review mutually-set A1C goal or target range.  Anticipate use of antihyperglycemic with or without insulin and periodic adjustments; consider active involvement of pharmacist.  Provide medical nutrition therapy and development of individualized eating.  Compare self-reported symptoms of hypo or hyperglycemia to blood glucose levels, diet and fluid intake, current medications, psychosocial and physiologic stressors, change in activity and barriers to care adherence.  Promote self-monitoring of blood glucose levels.  Assess and address barriers to management plan, such as food insecurity, age, developmental ability, depression, anxiety, fear of hypoglycemia or weight gain, as well as medication cost, side effects and complicated regimen.  Consider referral to community-based diabetes education program, visiting nurse, community health worker or health coach.  Encourage regular dental care for treatment of periodontal disease; refer to dental provider when needed.   Notes:      Task: Alleviate Barriers to Glycemic Management   Due Date: 03/22/2022  Note:   Care Management Activities:    - barriers to adherence to treatment plan identified - blood glucose monitoring encouraged - blood glucose readings reviewed - resources required to improve adherence to care identified - self-awareness of signs/symptoms of hypo or hyperglycemia encouraged - use of blood glucose monitoring log promoted    Notes: encouraged to check fingerstick blood sugar occasionally to ensure accuracy or freestyle libre system [id number] Patient is       Goals Addressed             This  Visit's Progress    [facility]) Enhance My Mental Skills   On track    Timeframe:  Short-Term Goal Priority:  High Start Date:   11/29/2020                          Expected End Date: [id number]        Follow p outreach [id number]   - do word search or crossword puzzles daily - stay in touch with my family and friends - take a walk daily and think about what I am seeing -discuss memory issues with PCP    Why is this important?   As we age, or sometimes because we have an illness, it feels like our memory and ability to figure things out is not very good.  There are things you can do to keep your memory  and your thinking as strong as possible.    Notes:  [id number] Patient is doing puzzles and going to the gym 3 x week.      [facility]) Learn More About My Health   On track    Timeframe:  Long-Range Goal Priority:  Medium Start Date:  11/29/2020                           Expected End Date:  [id number]            - tell my story and reason for my visit - make a list of questions - ask questions - repeat what I heard to make sure I understand - bring a list of my medicines to the visit - speak up when I don't understand    Why is this important?   The best way to learn about your health and care is by talking to the doctor and nurse.  They will answer your questions and give you information in the way that you like best.    Notes:      [facility]) Make and Keep All Appointments   On track    Timeframe:  Short-Term Goal Priority:  High Start Date:   11/29/2020                          Expected End Date:  [id number]                 Follow up outreach date [id number]   - ask family or friend for a ride - call to cancel if needed - keep a calendar with appointment dates -attempt to schedule appointment with PCP as soon as possible to discuss symptoms of anger and memory problems    Why is this important?   Part of staying healthy is seeing the doctor for follow-up care.  If you forget your  appointments, there are some things you can do to stay on track.    Notes:  [id number] Patient is trying to keep health maintenance appointments     [facility]) Manage My Emotions   On track    Timeframe:  Short-Term Goal Priority:  High Start Date:   11/29/2020                          Expected End Date:  [id number] Follow up [id number]                - begin personal counseling - practice relaxation or meditation daily - talk about feelings with a friend, family or spiritual advisor - practice positive thinking and self-talk -work with [facility] Social Worker for depression resources    Why is this important?   When you are stressed, down or upset, your body reacts too.  For example, your blood pressure may get higher; you may have a headache or stomachache.  When your emotions get the best of you, your body's ability to fight off cold and flu gets weak.  These steps will help you manage your emotions.     Notes:  [id number] Patient is trying to exercise 3 x a week to help with stress.      [facility]) Monitor and Manage My Blood Sugar-Diabetes Type 2   On track    Timeframe:  Long-Range Goal Priority:  Medium Start Date:  11/29/2020  Expected End Date:  [id number]            - check blood sugar at prescribed times - check blood sugar if I feel it is too high or too low - enter blood sugar readings and medication or insulin into daily log - take the blood sugar log to all doctor visits - take the blood sugar meter to all doctor visits -use fingerstick blood sugars to compare to [facility] when feeling like it is inaccurate    Why is this important?   Checking your blood sugar at home helps to keep it from getting very high or very low.  Writing the results in a diary or log helps the doctor know how to care for you.  Your blood sugar log should have the time, date and the results.  Also, write down the amount of insulin or other medicine that you take.  Other  information, like what you ate, exercise done and how you were feeling, will also be helpful.     Notes:  Patient A1C goal is 5.9 [id number] Patient A1C has increases to 6.6. She is monitoring closely     [facility]) Set My Target A1C-Diabetes Type 2   On track    Timeframe:  Long-Range Goal Priority:  Medium Start Date:   11/29/2020                          Expected End Date:  05/23/2021                          Why is this important?   Your target A1C is decided together by you and your doctor.  It is based on several things like your age and other health issues.    Notes:  A1C goal is 6.6  A1C is 5.9 [id number] A1C is 6.6. Patient is monitoring it closely        Plan:  Follow-up: Follow-up in 6 month(s) RN provided educational material on UTI RN sent Guide for journey with diabetes booklet RN sent update assessment to PCP Provided hypertension educational information  [place] Management [phone]

## 2021-08-23 DIAGNOSIS — Z20822 Contact with and (suspected) exposure to covid-19: Secondary | ICD-10-CM | POA: Diagnosis not present

## 2021-09-04 DIAGNOSIS — N3001 Acute cystitis with hematuria: Secondary | ICD-10-CM | POA: Diagnosis not present

## 2021-09-04 DIAGNOSIS — R32 Unspecified urinary incontinence: Secondary | ICD-10-CM | POA: Diagnosis not present

## 2021-09-08 DIAGNOSIS — Z23 Encounter for immunization: Secondary | ICD-10-CM | POA: Diagnosis not present

## 2021-09-12 DIAGNOSIS — Z23 Encounter for immunization: Secondary | ICD-10-CM | POA: Diagnosis not present

## 2021-09-25 ENCOUNTER — Other Ambulatory Visit: Payer: Self-pay | Admitting: Family Medicine

## 2021-09-25 DIAGNOSIS — Z1231 Encounter for screening mammogram for malignant neoplasm of breast: Secondary | ICD-10-CM

## 2021-10-30 DIAGNOSIS — G43109 Migraine with aura, not intractable, without status migrainosus: Secondary | ICD-10-CM | POA: Diagnosis not present

## 2021-10-30 DIAGNOSIS — Z20822 Contact with and (suspected) exposure to covid-19: Secondary | ICD-10-CM | POA: Diagnosis not present

## 2021-10-30 DIAGNOSIS — Z9841 Cataract extraction status, right eye: Secondary | ICD-10-CM | POA: Diagnosis not present

## 2021-11-01 ENCOUNTER — Other Ambulatory Visit: Payer: Self-pay

## 2021-11-01 ENCOUNTER — Ambulatory Visit
Admission: RE | Admit: 2021-11-01 | Discharge: 2021-11-01 | Disposition: A | Discharge status: Home / Self Care | Payer: Medicare Other | Source: Ambulatory Visit | Attending: Family Medicine | Admitting: Family Medicine

## 2021-11-01 DIAGNOSIS — Z1231 Encounter for screening mammogram for malignant neoplasm of breast: Secondary | ICD-10-CM | POA: Diagnosis not present

## 2021-11-02 DIAGNOSIS — M25561 Pain in right knee: Secondary | ICD-10-CM | POA: Diagnosis not present

## 2021-12-05 DIAGNOSIS — Z20822 Contact with and (suspected) exposure to covid-19: Secondary | ICD-10-CM | POA: Diagnosis not present

## 2021-12-25 DIAGNOSIS — Z20822 Contact with and (suspected) exposure to covid-19: Secondary | ICD-10-CM | POA: Diagnosis not present

## 2021-12-29 DIAGNOSIS — Z20822 Contact with and (suspected) exposure to covid-19: Secondary | ICD-10-CM | POA: Diagnosis not present

## 2022-01-09 DIAGNOSIS — G43109 Migraine with aura, not intractable, without status migrainosus: Secondary | ICD-10-CM | POA: Diagnosis not present

## 2022-01-17 DIAGNOSIS — I10 Essential (primary) hypertension: Secondary | ICD-10-CM | POA: Diagnosis not present

## 2022-01-17 DIAGNOSIS — E1169 Type 2 diabetes mellitus with other specified complication: Secondary | ICD-10-CM | POA: Diagnosis not present

## 2022-01-17 DIAGNOSIS — F3341 Major depressive disorder, recurrent, in partial remission: Secondary | ICD-10-CM | POA: Diagnosis not present

## 2022-01-17 DIAGNOSIS — E7800 Pure hypercholesterolemia, unspecified: Secondary | ICD-10-CM | POA: Diagnosis not present

## 2022-01-17 DIAGNOSIS — K219 Gastro-esophageal reflux disease without esophagitis: Secondary | ICD-10-CM | POA: Diagnosis not present

## 2022-01-17 DIAGNOSIS — Z7984 Long term (current) use of oral hypoglycemic drugs: Secondary | ICD-10-CM | POA: Diagnosis not present

## 2022-01-22 DIAGNOSIS — N3001 Acute cystitis with hematuria: Secondary | ICD-10-CM | POA: Diagnosis not present

## 2022-01-22 DIAGNOSIS — R32 Unspecified urinary incontinence: Secondary | ICD-10-CM | POA: Diagnosis not present

## 2022-01-30 ENCOUNTER — Other Ambulatory Visit: Payer: Self-pay | Admitting: *Deleted

## 2022-01-30 NOTE — Patient Outreach (Signed)
[facility]) Care Management  01/30/2022  [name] 1951/04/18 [id number]   RN Health Coach attempted follow up outreach call to patient.  Patient was unavailable. No voicemail pick up.  Plan: RN will call patient again within 30 days.  [place] Care Management [phone]

## 2022-02-06 ENCOUNTER — Emergency Department (HOSPITAL_BASED_OUTPATIENT_CLINIC_OR_DEPARTMENT_OTHER)
Admission: EM | Admit: 2022-02-06 | Discharge: 2022-02-06 | Disposition: A | Discharge status: Home / Self Care | Payer: Medicare Other | Attending: Emergency Medicine | Admitting: Emergency Medicine

## 2022-02-06 ENCOUNTER — Encounter (HOSPITAL_BASED_OUTPATIENT_CLINIC_OR_DEPARTMENT_OTHER): Payer: Self-pay | Admitting: Radiology

## 2022-02-06 ENCOUNTER — Emergency Department (HOSPITAL_BASED_OUTPATIENT_CLINIC_OR_DEPARTMENT_OTHER): Payer: Medicare Other

## 2022-02-06 ENCOUNTER — Other Ambulatory Visit: Payer: Self-pay

## 2022-02-06 DIAGNOSIS — Z79899 Other long term (current) drug therapy: Secondary | ICD-10-CM | POA: Insufficient documentation

## 2022-02-06 DIAGNOSIS — K5900 Constipation, unspecified: Secondary | ICD-10-CM | POA: Diagnosis not present

## 2022-02-06 DIAGNOSIS — R9431 Abnormal electrocardiogram [ECG] [EKG]: Secondary | ICD-10-CM | POA: Diagnosis not present

## 2022-02-06 DIAGNOSIS — R519 Headache, unspecified: Secondary | ICD-10-CM | POA: Insufficient documentation

## 2022-02-06 DIAGNOSIS — R1110 Vomiting, unspecified: Secondary | ICD-10-CM | POA: Diagnosis not present

## 2022-02-06 DIAGNOSIS — R112 Nausea with vomiting, unspecified: Secondary | ICD-10-CM | POA: Diagnosis present

## 2022-02-06 DIAGNOSIS — F0390 Unspecified dementia without behavioral disturbance: Secondary | ICD-10-CM | POA: Insufficient documentation

## 2022-02-06 DIAGNOSIS — I10 Essential (primary) hypertension: Secondary | ICD-10-CM | POA: Diagnosis not present

## 2022-02-06 DIAGNOSIS — R109 Unspecified abdominal pain: Secondary | ICD-10-CM | POA: Diagnosis not present

## 2022-02-06 DIAGNOSIS — E119 Type 2 diabetes mellitus without complications: Secondary | ICD-10-CM | POA: Diagnosis not present

## 2022-02-06 DIAGNOSIS — R7989 Other specified abnormal findings of blood chemistry: Secondary | ICD-10-CM | POA: Insufficient documentation

## 2022-02-06 LAB — URINALYSIS, ROUTINE W REFLEX MICROSCOPIC
Bilirubin Urine: NEGATIVE
Glucose, UA: NEGATIVE mg/dL
Hgb urine dipstick: NEGATIVE
Ketones, ur: 15 mg/dL — AB
Leukocytes,Ua: NEGATIVE
Nitrite: NEGATIVE
Specific Gravity, Urine: 1.024 (ref 1.005–1.030)
pH: 5.5 (ref 5.0–8.0)

## 2022-02-06 LAB — CBC
HCT: 44.2 % (ref 36.0–46.0)
Hemoglobin: 13.8 g/dL (ref 12.0–15.0)
MCH: 29.1 pg (ref 26.0–34.0)
MCHC: 31.2 g/dL (ref 30.0–36.0)
MCV: 93.1 fL (ref 80.0–100.0)
Platelets: 364 10*3/uL (ref 150–400)
RBC: 4.75 MIL/uL (ref 3.87–5.11)
RDW: 13.4 % (ref 11.5–15.5)
WBC: 8.4 10*3/uL (ref 4.0–10.5)
nRBC: 0 % (ref 0.0–0.2)

## 2022-02-06 LAB — COMPREHENSIVE METABOLIC PANEL
ALT: 24 U/L (ref 0–44)
AST: 26 U/L (ref 15–41)
Albumin: 4.8 g/dL (ref 3.5–5.0)
Alkaline Phosphatase: 65 U/L (ref 38–126)
Anion gap: 14 (ref 5–15)
BUN: 25 mg/dL — ABNORMAL HIGH (ref 8–23)
CO2: 23 mmol/L (ref 22–32)
Calcium: 10.8 mg/dL — ABNORMAL HIGH (ref 8.9–10.3)
Chloride: 100 mmol/L (ref 98–111)
Creatinine, Ser: 1.18 mg/dL — ABNORMAL HIGH (ref 0.44–1.00)
GFR, Estimated: 49 mL/min — ABNORMAL LOW (ref >60)
Glucose, Bld: 61 mg/dL — ABNORMAL LOW (ref 70–99)
Potassium: 4.7 mmol/L (ref 3.5–5.1)
Sodium: 137 mmol/L (ref 135–145)
Total Bilirubin: 0.7 mg/dL (ref 0.3–1.2)
Total Protein: 7.9 g/dL (ref 6.5–8.1)

## 2022-02-06 LAB — LIPASE, BLOOD: Lipase: 32 U/L (ref 11–51)

## 2022-02-06 MED ORDER — CEPHALEXIN 500 MG PO CAPS: 500.0000 mg | ORAL_CAPSULE | Freq: Four times a day (QID) | ORAL | 0 refills | Status: DC

## 2022-02-06 MED ORDER — ONDANSETRON HCL 4 MG/2ML IJ SOLN
4.0000 mg | Freq: Once | INTRAMUSCULAR | Status: AC
  Administered 2022-02-06: 4 mg via INTRAVENOUS
  Filled 2022-02-06: qty 2

## 2022-02-06 MED ORDER — IOHEXOL 300 MG/ML  SOLN
85.0000 mL | Freq: Once | INTRAMUSCULAR | Status: AC | PRN
  Administered 2022-02-06: 85 mL via INTRAVENOUS

## 2022-02-06 MED ORDER — CEPHALEXIN 250 MG PO CAPS
500.0000 mg | ORAL_CAPSULE | Freq: Once | ORAL | Status: AC
  Administered 2022-02-06: 500 mg via ORAL
  Filled 2022-02-06: qty 2

## 2022-02-06 MED ORDER — MORPHINE SULFATE (PF) 4 MG/ML IV SOLN
4.0000 mg | Freq: Once | INTRAVENOUS | Status: AC
  Administered 2022-02-06: 4 mg via INTRAVENOUS
  Filled 2022-02-06: qty 1

## 2022-02-06 MED ORDER — LACTATED RINGERS IV BOLUS
1000.0000 mL | Freq: Once | INTRAVENOUS | Status: AC
  Administered 2022-02-06: 1000 mL via INTRAVENOUS

## 2022-02-06 MED ORDER — ONDANSETRON 4 MG PO TBDP: 4.0000 mg | ORAL_TABLET | Freq: Three times a day (TID) | ORAL | 0 refills | Status: DC | PRN

## 2022-02-06 NOTE — ED Notes (Signed)
Report received and care resumed. Rounded on pt. Pt currently on bedside commode having a BM. No signs of distress noted. Will continue to monitor

## 2022-02-06 NOTE — ED Provider Notes (Signed)
[facility] EMERGENCY DEPT Provider Note   CSN: [id number] Arrival date & time: 02/06/22  1144     History  Chief Complaint  Patient presents with   Vomiting    [name] is a 71 y.o. female who presents with her husband the bedside with concern for nausea and vomiting that started 3 days ago.  She states that she "cannot keep any food, water, or my meds down".  She also states she is only vomiting 1-2 times per day.  She states she was able to tolerate all of her medications this morning.  Emesis is nonbloody and nonbilious.  She does state she has been constipated for the last week.  She typically has problems with constipation and treats it with MiraLAX but has not been able to tolerate MiraLAX for the last 3 days due to her nausea.  She denies being around anyone ill.  She denies any diarrhea fevers, chills.  She does have a headache at time of my evaluation.  She denies melena or hematochezia but also states she has not passed any gas for the last 48 hours  I have personally reviewed this patient's medical records.  She is history of diabetes, epilepsy recently started on Lamictal, hypertension, hypercholesterol, dementia.  She is not anticoagulated.  HPI     Home Medications Prior to Admission medications   Medication Sig Start Date End Date Taking? Authorizing Provider  cephALEXin (KEFLEX) 500 MG capsule Take 1 capsule (500 mg total) by mouth 4 (four) times daily. 02/06/22  Yes Ulrick Methot R, PA-C  ondansetron (ZOFRAN-ODT) 4 MG disintegrating tablet Take 1 tablet (4 mg total) by mouth every 8 (eight) hours as needed for nausea or vomiting. 02/06/22  Yes Amillion Macchia R, PA-C  acetaminophen (TYLENOL) 325 MG tablet Take 2 tablets (650 mg total) by mouth every 6 (six) hours as needed for up to 30 doses for mild pain or moderate pain. 03/24/21   Wyvonnia Dusky, MD  amLODipine (NORVASC) 5 MG tablet Take 5 mg by mouth daily.    [provider]   Ascorbic Acid (VITAMIN C) 1000 MG tablet Take 1,000 mg by mouth daily.    [provider]  atorvastatin (LIPITOR) 80 MG tablet Take 80 mg by mouth daily.    [provider]  calcium carbonate (OSCAL) 1500 (600 Ca) MG TABS tablet Take by mouth daily.    [provider]  chlorhexidine (PERIDEX) 0.12 % solution Use as directed 15 mLs in the mouth or throat 2 (two) times daily. 03/24/21   Volanda Napoleon, PA-C  Cholecalciferol (VITAMIN D-3) 125 MCG (5000 UT) TABS Take by mouth.    [provider]  DEXILANT 60 MG capsule Take 1 capsule by mouth daily. 08/31/20   [provider]  diphenhydrAMINE (BENADRYL) 25 mg capsule Take 25 mg by mouth every 6 (six) hours as needed.    [provider]  divalproex (DEPAKOTE ER) 500 MG 24 hr tablet TAKE 1 TABLET BY MOUTH EVERY MORNING AND TAKE 2 TABLETS BY MOUTH EVERY EVENING Patient taking differently: Take 500-1,000 mg by mouth See admin instructions. Take 1 tablet (500 mg) by mouth every morning and 2 tablets (1000 mg) at night 10/11/16   Marcial Pacas, MD  Erenumab-aooe (AIMOVIG, 140 MG DOSE, Percival) Inject 140 mg into the skin every 30 (thirty) days. For migraine prevention    [provider]  ezetimibe (ZETIA) 10 MG tablet Take 1 tablet by mouth daily. 03/10/20   [provider]  ferrous sulfate 325 (65 FE) MG tablet Take 325 mg by mouth daily with breakfast.    [provider]  HYDROcodone-acetaminophen (NORCO/VICODIN) 5-325 MG tablet Take by mouth. 09/22/20   [provider]  magnesium oxide (MAG-OX) 400 MG tablet Take 500 mg by mouth daily.    [provider]  methocarbamol (ROBAXIN) 750 MG tablet  06/14/20   [provider]  Multiple Vitamin (MULTIVITAMIN WITH MINERALS) TABS Take 1 tablet by mouth daily.    [provider]  oxyCODONE (ROXICODONE) 5 MG immediate release tablet Take 2.5 tablets (12.5 mg total) by mouth every 6 (six) hours as needed for up to  12 doses for severe pain. 03/24/21   Wyvonnia Dusky, MD  oxyCODONE-acetaminophen (PERCOCET/ROXICET) 5-325 MG tablet Take 1-2 tablets by mouth every 8 (eight) hours as needed for severe pain. 03/24/21   Providence Lanius A, PA-C  pyridOXINE (VITAMIN B-6) 100 MG tablet Take 100 mg by mouth daily.    [provider]  SUMAtriptan-naproxen (TREXIMET) 85-500 MG tablet Take 1 tablet by mouth 2 (two) times daily as needed for migraine. Max 2 tab per 24 hours, 1-2 days per week 10/07/18   [provider]  topiramate (TOPAMAX) 100 MG tablet Take 200 mg by mouth daily.  07/08/14   [provider]  Ubrogepant (UBRELVY) 100 MG TABS Take 100 mg by mouth daily as needed (migraines).    [provider]  vitamin B-12 (CYANOCOBALAMIN) 100 MCG tablet Take 100 mcg by mouth daily.    [provider]      Allergies    Aspirin, Nsaids, and Tolmetin    Review of Systems   Review of Systems  Constitutional:  Positive for appetite change and fatigue. Negative for activity change, chills and fever.  HENT: Negative.    Eyes: Negative.   Respiratory: Negative.    Cardiovascular: Negative.   Gastrointestinal:  Positive for constipation, nausea and vomiting. Negative for abdominal pain, anal bleeding and blood in stool.  Genitourinary:  Positive for flank pain. Negative for decreased urine volume, frequency and urgency.  Skin: Negative.   Neurological: Negative.   Hematological: Negative.    Physical Exam Updated Vital Signs BP (!) 148/81    Pulse 78    Temp 97.9 F (36.6 C) (Tympanic)    Resp 18    Ht 5' (1.524 m)    Wt 58.8 kg    SpO2 100%    BMI 25.32 kg/m  Physical Exam Vitals and nursing note reviewed. Exam conducted with a chaperone present.  Constitutional:      Appearance: She is not ill-appearing or toxic-appearing.  HENT:     Head: Normocephalic and atraumatic.     Nose: Nose normal.     Mouth/Throat:     Mouth: Mucous membranes are moist.     Pharynx: No  oropharyngeal exudate or posterior oropharyngeal erythema.  Eyes:     General:        Right eye: No discharge.        Left eye: No discharge.     Extraocular Movements: Extraocular movements intact.     Conjunctiva/sclera: Conjunctivae normal.     Pupils: Pupils are equal, round, and reactive to light.  Cardiovascular:     Rate and Rhythm: Normal rate and regular rhythm.     Pulses: Normal pulses.     Heart sounds: Normal heart sounds. No murmur heard. Pulmonary:     Effort: Pulmonary effort is normal. No respiratory  distress.     Breath sounds: Normal breath sounds. No wheezing or rales.  Abdominal:     General: Bowel sounds are decreased. There is no distension.     Palpations: Abdomen is soft.     Tenderness: There is no abdominal tenderness. There is no right CVA tenderness, left CVA tenderness, guarding or rebound.  Genitourinary:    Rectum: No tenderness, anal fissure, external hemorrhoid or internal hemorrhoid. Normal anal tone.     Comments: Rectal exam performed by this provider with nonmelanotic appearing stool that is very soft and caliber just at the distal reach of the finger on DRE.  No evidence of impaction on physical exam.  No tenderness palpation or fullness to suggest internal infection Musculoskeletal:        General: No deformity.     Cervical back: Neck supple. No tenderness.     Right lower leg: No edema.     Left lower leg: No edema.  Lymphadenopathy:     Cervical: No cervical adenopathy.  Skin:    General: Skin is warm and dry.     Capillary Refill: Capillary refill takes less than 2 seconds.  Neurological:     General: No focal deficit present.     Mental Status: She is alert and oriented to person, place, and time. Mental status is at baseline.     Gait: Gait is intact.  Psychiatric:        Mood and Affect: Mood normal.    ED Results / Procedures / Treatments   Labs (all labs ordered are listed, but only abnormal results are displayed) Labs  Reviewed  COMPREHENSIVE METABOLIC PANEL - Abnormal; Notable for the following components:      Result Value   Glucose, Bld 61 (*)    BUN 25 (*)    Creatinine, Ser 1.18 (*)    Calcium 10.8 (*)    GFR, Estimated 49 (*)    All other components within normal limits  URINALYSIS, ROUTINE W REFLEX MICROSCOPIC - Abnormal; Notable for the following components:   Ketones, ur 15 (*)    Protein, ur TRACE (*)    All other components within normal limits  URINE CULTURE  LIPASE, BLOOD  CBC    EKG EKG Interpretation  Date/Time:  Tuesday February 06 2022 15:38:26 EST Ventricular Rate:  72 PR Interval:  107 QRS Duration: 129 QT Interval:  447 QTC Calculation: 490 R Axis:   63 Text Interpretation: Sinus rhythm Short PR interval IVCD, consider atypical LBBB No significant change since last tracing Confirmed by Calvert Cantor [phone]) on 02/06/2022 4:18:13 PM  Radiology CT Abdomen Pelvis W Contrast  Result Date: 02/06/2022 CLINICAL DATA:  Abdominal pain, nausea, vomiting EXAM: CT ABDOMEN AND PELVIS WITH CONTRAST TECHNIQUE: Multidetector CT imaging of the abdomen and pelvis was performed using the standard protocol following bolus administration of intravenous contrast. RADIATION DOSE REDUCTION: This exam was performed according to the departmental dose-optimization program which includes automated exposure control, adjustment of the mA and/or kV according to patient size and/or use of iterative reconstruction technique. CONTRAST:  70mL OMNIPAQUE IOHEXOL 300 MG/ML  SOLN COMPARISON:  08/04/2018 FINDINGS: Lower chest: Unremarkable. Hepatobiliary: There is 1.7 cm fluid density lesion in the right lobe of liver. Gallbladder is distended. There is no wall thickening. There are multiple calcified gallbladder stones. There is no significant dilation of bile ducts. Pancreas: There is prominence of pancreatic duct. No focal abnormality is seen. Spleen: Unremarkable. Adrenals/Urinary Tract: Adrenals are  unremarkable. There is no  hydronephrosis. There are no renal or ureteral stones. There are pockets of air in the lumen of the urinary bladder. Stomach/Bowel: Small hiatal hernia is seen. Stomach is not distended. Small bowel loops are not dilated. Appendix is not dilated. Scattered diverticula are seen in colon without signs of focal acute diverticulitis. Vascular/Lymphatic: Scattered atherosclerotic plaques and calcifications are seen Reproductive: Uterus is not seen suggesting hysterectomy. There are no adnexal masses. Other: There is no ascites or pneumoperitoneum. There is interval repair of left inguinal hernia. Musculoskeletal: Unremarkable IMPRESSION: There is no evidence of intestinal obstruction or pneumoperitoneum. There is no hydronephrosis. Appendix is not dilated. Gallbladder is distended without wall thickening. Gallbladder stones are seen. There is no dilation of bile ducts. There is 1.7 cm cyst in the right lobe of liver. Few diverticula are seen in colon without signs of focal diverticulitis. Small hiatal hernia. There are pockets of air in the urinary bladder which may be due to recent catheterization or suggest cystitis. There is no bladder wall thickening. Other findings as described in the body of the report. Electronically Signed   By: Elmer Picker M.D.   On: 02/06/2022 16:16    Procedures Procedures    Medications Ordered in ED Medications  ondansetron (ZOFRAN) injection 4 mg (4 mg Intravenous Given 02/06/22 1538)  morphine (PF) 4 MG/ML injection 4 mg (4 mg Intravenous Given 02/06/22 1538)  lactated ringers bolus 1,000 mL (0 mLs Intravenous Stopped 02/06/22 1703)  iohexol (OMNIPAQUE) 300 MG/ML solution 85 mL (85 mLs Intravenous Contrast Given 02/06/22 1558)  cephALEXin (KEFLEX) capsule 500 mg (500 mg Oral Given 02/06/22 1816)    ED Course/ Medical Decision Making/ A&P                           Medical Decision Making 71 year old female who presents with concern for nausea  and vomiting for the last few days.   Hypertensive on intake, VS otherwise normal. Cardiopulmonary exam is normal. Abdominal exam with decreased bowel sounds.Tolerating PO at this time.   Differential diagnosis includes but is not limited to bowel obstruction, gastroenteritis, cholecystitis, pancreatitis, constipation, GERD.  Amount and/or Complexity of Data Reviewed Labs: ordered.    Details: CBC, UA, lipase is unremarkable. CMP with basleine creatinine of 1.18 Radiology: ordered.    Details: CT AP without obstruction. There is air in the bladder, quesiton cystitis.  Risk Prescription drug management.   Overall workup is reassuring. Rectal exam without definitive impaction. Enema offered with significant stool burden on CT. Some relief experienced following enema. Patient's nausea remains resolved. While the exact etiology of this patient's symptoms remains unclear, clinical concern for emergent issue that would warrant further ED workup or inpatient management is low. Patient is tolerating PO, requesting discharge. Suspect possible viral etiology for nausea and vomiting, contributed to by chronic constipation, not tolerating daily miralax for the last 4 days. Will d/c with zofran.  Germaine voiced understanding of her medical evaluation and treatment plan. Each of her questions was answered to her expressed satisfaction. Return precautions were given. Patient was well-appearing, stable, and discharged in good condition.   This chart was dictated using voice recognition software, Dragon. Despite the best efforts of this provider to proofread and correct errors, errors may still occur which can change documentation meaning.    Final Clinical Impression(s) / ED Diagnoses Final diagnoses:  Constipation, unspecified constipation type    Rx / DC Orders ED Discharge Orders  Ordered    ondansetron (ZOFRAN-ODT) 4 MG disintegrating tablet  Every 8 hours PRN        02/06/22 1939     cephALEXin (KEFLEX) 500 MG capsule  4 times daily        02/06/22 1940              Khayla Koppenhaver, Gypsy Balsam, PA-C 02/07/22 1004    Regan Lemming, MD 02/08/22 [phone]

## 2022-02-06 NOTE — Discharge Instructions (Addendum)
You are seen in the ER today for your nausea and your constipation.  Your physical exam provide signs, blood work, and CT scan were very reassuring.  You have been prescribed nausea medication to take as needed for the next 2 days.  Additionally please titrate up your MiraLAX by one half, serving at a time until you are having 2 soft bowel movements per day and then you may begin to titrate back down.  You may also take a dose of stimulating laxative such as Senokot.  Please follow-up with your primary care doctor and return to the ER with any new severe symptoms.  Additionally there was concern on your CT scan and your urine test that you may have a urinary tract infection.  Given your urinary frequency you have been prescribed an antibiotic to take 4 times a day for the next 5 days.  Please take it as prescribed for the entire course and follow-up with your primary care doctor for urine recheck.

## 2022-02-06 NOTE — ED Triage Notes (Signed)
Pt started having n/v since Sat.states she can not keep meds, food or water down.   Denies Diarrhea and abdominal pain.

## 2022-02-07 ENCOUNTER — Emergency Department (HOSPITAL_COMMUNITY)
Admission: EM | Admit: 2022-02-07 | Discharge: 2022-02-07 | Disposition: A | Discharge status: Home / Self Care | Payer: Medicare Other | Attending: Emergency Medicine | Admitting: Emergency Medicine

## 2022-02-07 ENCOUNTER — Other Ambulatory Visit: Payer: Self-pay

## 2022-02-07 ENCOUNTER — Emergency Department (HOSPITAL_COMMUNITY): Payer: Medicare Other

## 2022-02-07 DIAGNOSIS — Z79899 Other long term (current) drug therapy: Secondary | ICD-10-CM | POA: Diagnosis not present

## 2022-02-07 DIAGNOSIS — K8020 Calculus of gallbladder without cholecystitis without obstruction: Secondary | ICD-10-CM | POA: Diagnosis not present

## 2022-02-07 DIAGNOSIS — R519 Headache, unspecified: Secondary | ICD-10-CM | POA: Insufficient documentation

## 2022-02-07 DIAGNOSIS — R112 Nausea with vomiting, unspecified: Secondary | ICD-10-CM

## 2022-02-07 DIAGNOSIS — R1011 Right upper quadrant pain: Secondary | ICD-10-CM | POA: Diagnosis not present

## 2022-02-07 DIAGNOSIS — I10 Essential (primary) hypertension: Secondary | ICD-10-CM | POA: Insufficient documentation

## 2022-02-07 DIAGNOSIS — F0390 Unspecified dementia without behavioral disturbance: Secondary | ICD-10-CM | POA: Insufficient documentation

## 2022-02-07 LAB — CBC WITH DIFFERENTIAL/PLATELET
Abs Immature Granulocytes: 0.04 10*3/uL (ref 0.00–0.07)
Basophils Absolute: 0 10*3/uL (ref 0.0–0.1)
Basophils Relative: 0 %
Eosinophils Absolute: 0 10*3/uL (ref 0.0–0.5)
Eosinophils Relative: 0 %
HCT: 43 % (ref 36.0–46.0)
Hemoglobin: 13.8 g/dL (ref 12.0–15.0)
Immature Granulocytes: 0 %
Lymphocytes Relative: 26 %
Lymphs Abs: 2.7 10*3/uL (ref 0.7–4.0)
MCH: 29.9 pg (ref 26.0–34.0)
MCHC: 32.1 g/dL (ref 30.0–36.0)
MCV: 93.1 fL (ref 80.0–100.0)
Monocytes Absolute: 0.7 10*3/uL (ref 0.1–1.0)
Monocytes Relative: 7 %
Neutro Abs: 6.8 10*3/uL (ref 1.7–7.7)
Neutrophils Relative %: 67 %
Platelets: 357 10*3/uL (ref 150–400)
RBC: 4.62 MIL/uL (ref 3.87–5.11)
RDW: 13.3 % (ref 11.5–15.5)
WBC: 10.3 10*3/uL (ref 4.0–10.5)
nRBC: 0 % (ref 0.0–0.2)

## 2022-02-07 LAB — URINALYSIS, ROUTINE W REFLEX MICROSCOPIC
Bilirubin Urine: NEGATIVE
Glucose, UA: NEGATIVE mg/dL
Hgb urine dipstick: NEGATIVE
Ketones, ur: 20 mg/dL — AB
Leukocytes,Ua: NEGATIVE
Nitrite: NEGATIVE
Protein, ur: NEGATIVE mg/dL
Specific Gravity, Urine: 1.026 (ref 1.005–1.030)
pH: 5 (ref 5.0–8.0)

## 2022-02-07 LAB — COMPREHENSIVE METABOLIC PANEL
ALT: 27 U/L (ref 0–44)
AST: 28 U/L (ref 15–41)
Albumin: 4.3 g/dL (ref 3.5–5.0)
Alkaline Phosphatase: 60 U/L (ref 38–126)
Anion gap: 14 (ref 5–15)
BUN: 20 mg/dL (ref 8–23)
CO2: 22 mmol/L (ref 22–32)
Calcium: 10 mg/dL (ref 8.9–10.3)
Chloride: 100 mmol/L (ref 98–111)
Creatinine, Ser: 1.07 mg/dL — ABNORMAL HIGH (ref 0.44–1.00)
GFR, Estimated: 55 mL/min — ABNORMAL LOW (ref >60)
Glucose, Bld: 77 mg/dL (ref 70–99)
Potassium: 4.2 mmol/L (ref 3.5–5.1)
Sodium: 136 mmol/L (ref 135–145)
Total Bilirubin: 0.4 mg/dL (ref 0.3–1.2)
Total Protein: 7.3 g/dL (ref 6.5–8.1)

## 2022-02-07 LAB — LIPASE, BLOOD: Lipase: 32 U/L (ref 11–51)

## 2022-02-07 MED ORDER — BUTALBITAL-APAP-CAFFEINE 50-325-40 MG PO TABS
1.0000 | ORAL_TABLET | Freq: Once | ORAL | Status: AC
  Administered 2022-02-07: 1 via ORAL
  Filled 2022-02-07: qty 1

## 2022-02-07 MED ORDER — ONDANSETRON HCL 4 MG/2ML IJ SOLN
4.0000 mg | Freq: Once | INTRAMUSCULAR | Status: AC
  Administered 2022-02-07: 4 mg via INTRAVENOUS
  Filled 2022-02-07: qty 2

## 2022-02-07 MED ORDER — SODIUM CHLORIDE 0.9 % IV BOLUS
1000.0000 mL | Freq: Once | INTRAVENOUS | Status: AC
  Administered 2022-02-07: 1000 mL via INTRAVENOUS

## 2022-02-07 NOTE — ED Provider Triage Note (Signed)
Emergency Medicine Provider Triage Evaluation Note  [name] , a 71 y.o. female  was evaluated in triage.  Pt complains of nausea/vomiting.  Has had nausea vomiting and constipation x 4 days.  Was seen yesterday for same and report her sxs was not adequately treated.  Return again today  Review of Systems  Positive: Nausea, vomit, constipation Negative: Fever, abd pain, dysuria  Physical Exam  BP (!) 159/67 (BP Location: Left Arm)    Pulse 83    Temp 98.3 F (36.8 C)    Resp 16    SpO2 100%  Gen:   Awake, no distress   Resp:  Normal effort  MSK:   Moves extremities without difficulty  Other:    Medical Decision Making  Medically screening exam initiated at 10:04 AM.  Appropriate orders placed.  Thea Alken was informed that the remainder of the evaluation will be completed by another provider, this initial triage assessment does not replace that evaluation, and the importance of remaining in the ED until their evaluation is complete.     Domenic Moras, PA-C 02/07/22 1008

## 2022-02-07 NOTE — ED Provider Notes (Signed)
[facility] EMERGENCY DEPARTMENT Provider Note   CSN: [id number] Arrival date & time: 02/07/22  0762     History  Chief Complaint  Patient presents with   Nausea   Emesis   Migraine    [name] is a 71 y.o. female.  The history is provided by the patient and medical records. No language interpreter was used.  Emesis Migraine   71 year old female with significant history of dementia, hypercholesterolemia, hypertension, polypharmacy presenting complaining of nausea and vomiting.  For the past 3 to 4 days patient has had persistent nausea and vomiting and seems to be unable to keep anything down.  She vomits at least once or twice a day.  She also endorsed feeling constipated not relieved with MiraLAX.  Patient was initially seen and evaluated in the ED yesterday for her complaint.  CT scan of abdomen pelvis was obtained at that time without any acute finding.  Evidence of cholelithiasis.  No evidence of SBO.  The patient did tolerate p.o. and subsequently discharged home with Zofran.  She returns today stating that her symptoms still persist.  She still endorse a throbbing left temporal headache.  No vision changes, no focal numbness or focal weakness.  She still endorse nausea with occasional bouts of nonbloody and nonbilious emesis.  She does not complain of any abdominal pain or back pain or dysuria.    Home Medications Prior to Admission medications   Medication Sig Start Date End Date Taking? Authorizing Provider  acetaminophen (TYLENOL) 325 MG tablet Take 2 tablets (650 mg total) by mouth every 6 (six) hours as needed for up to 30 doses for mild pain or moderate pain. 03/24/21   Wyvonnia Dusky, MD  amLODipine (NORVASC) 5 MG tablet Take 5 mg by mouth daily.    [provider]  Ascorbic Acid (VITAMIN C) 1000 MG tablet Take 1,000 mg by mouth daily.    [provider]  atorvastatin (LIPITOR) 80 MG tablet Take 80 mg by mouth daily.     [provider]  calcium carbonate (OSCAL) 1500 (600 Ca) MG TABS tablet Take by mouth daily.    [provider]  cephALEXin (KEFLEX) 500 MG capsule Take 1 capsule (500 mg total) by mouth 4 (four) times daily. 02/06/22   Sponseller, Gypsy Balsam, PA-C  chlorhexidine (PERIDEX) 0.12 % solution Use as directed 15 mLs in the mouth or throat 2 (two) times daily. 03/24/21   Volanda Napoleon, PA-C  Cholecalciferol (VITAMIN D-3) 125 MCG (5000 UT) TABS Take by mouth.    [provider]  DEXILANT 60 MG capsule Take 1 capsule by mouth daily. 08/31/20   [provider]  diphenhydrAMINE (BENADRYL) 25 mg capsule Take 25 mg by mouth every 6 (six) hours as needed.    [provider]  divalproex (DEPAKOTE ER) 500 MG 24 hr tablet TAKE 1 TABLET BY MOUTH EVERY MORNING AND TAKE 2 TABLETS BY MOUTH EVERY EVENING Patient taking differently: Take 500-1,000 mg by mouth See admin instructions. Take 1 tablet (500 mg) by mouth every morning and 2 tablets (1000 mg) at night 10/11/16   Marcial Pacas, MD  Erenumab-aooe (AIMOVIG, 140 MG DOSE, [place]) Inject 140 mg into the skin every 30 (thirty) days. For migraine prevention    [provider]  ezetimibe (ZETIA) 10 MG tablet Take 1 tablet by mouth daily. 03/10/20   [provider]  ferrous sulfate 325 (65 FE) MG tablet Take 325 mg by mouth daily with breakfast.  [provider]  HYDROcodone-acetaminophen (NORCO/VICODIN) 5-325 MG tablet Take by mouth. 09/22/20   [provider]  magnesium oxide (MAG-OX) 400 MG tablet Take 500 mg by mouth daily.    [provider]  methocarbamol (ROBAXIN) 750 MG tablet  06/14/20   [provider]  Multiple Vitamin (MULTIVITAMIN WITH MINERALS) TABS Take 1 tablet by mouth daily.    [provider]  ondansetron (ZOFRAN-ODT) 4 MG disintegrating tablet Take 1 tablet (4 mg total) by mouth every 8 (eight) hours as needed for nausea or vomiting. 02/06/22   Sponseller,  Eugene Garnet R, PA-C  oxyCODONE (ROXICODONE) 5 MG immediate release tablet Take 2.5 tablets (12.5 mg total) by mouth every 6 (six) hours as needed for up to 12 doses for severe pain. 03/24/21   Wyvonnia Dusky, MD  oxyCODONE-acetaminophen (PERCOCET/ROXICET) 5-325 MG tablet Take 1-2 tablets by mouth every 8 (eight) hours as needed for severe pain. 03/24/21   Providence Lanius A, PA-C  pyridOXINE (VITAMIN B-6) 100 MG tablet Take 100 mg by mouth daily.    [provider]  SUMAtriptan-naproxen (TREXIMET) 85-500 MG tablet Take 1 tablet by mouth 2 (two) times daily as needed for migraine. Max 2 tab per 24 hours, 1-2 days per week 10/07/18   [provider]  topiramate (TOPAMAX) 100 MG tablet Take 200 mg by mouth daily.  07/08/14   [provider]  Ubrogepant (UBRELVY) 100 MG TABS Take 100 mg by mouth daily as needed (migraines).    [provider]  vitamin B-12 (CYANOCOBALAMIN) 100 MCG tablet Take 100 mcg by mouth daily.    [provider]      Allergies    Aspirin, Nsaids, and Tolmetin    Review of Systems   Review of Systems  Gastrointestinal:  Positive for vomiting.  All other systems reviewed and are negative.  Physical Exam Updated Vital Signs BP (!) 146/70 (BP Location: Right Arm)    Pulse 81    Temp 97.9 F (36.6 C) (Oral)    Resp 18    SpO2 100%  Physical Exam Vitals and nursing note reviewed.  Constitutional:      General: She is not in acute distress.    Appearance: She is well-developed.  HENT:     Head: Atraumatic.  Eyes:     Conjunctiva/sclera: Conjunctivae normal.  Cardiovascular:     Rate and Rhythm: Normal rate and regular rhythm.  Pulmonary:     Effort: Pulmonary effort is normal.  Abdominal:     General: Abdomen is flat.     Palpations: Abdomen is soft.     Tenderness: There is no abdominal tenderness.  Musculoskeletal:     Cervical back: Neck supple.  Skin:    Findings: No rash.  Neurological:     Mental Status: She is alert.  Mental status is at baseline.  Psychiatric:        Mood and Affect: Mood normal.    ED Results / Procedures / Treatments   Labs (all labs ordered are listed, but only abnormal results are displayed) Labs Reviewed  COMPREHENSIVE METABOLIC PANEL - Abnormal; Notable for the following components:      Result Value   Creatinine, Ser 1.07 (*)    GFR, Estimated 55 (*)    All other components within normal limits  CBC WITH DIFFERENTIAL/PLATELET  LIPASE, BLOOD  URINALYSIS, ROUTINE W REFLEX MICROSCOPIC    EKG None  Radiology CT Abdomen Pelvis W Contrast  Result Date: 02/06/2022 CLINICAL DATA:  Abdominal  pain, nausea, vomiting EXAM: CT ABDOMEN AND PELVIS WITH CONTRAST TECHNIQUE: Multidetector CT imaging of the abdomen and pelvis was performed using the standard protocol following bolus administration of intravenous contrast. RADIATION DOSE REDUCTION: This exam was performed according to the departmental dose-optimization program which includes automated exposure control, adjustment of the mA and/or kV according to patient size and/or use of iterative reconstruction technique. CONTRAST:  33mL OMNIPAQUE IOHEXOL 300 MG/ML  SOLN COMPARISON:  08/04/2018 FINDINGS: Lower chest: Unremarkable. Hepatobiliary: There is 1.7 cm fluid density lesion in the right lobe of liver. Gallbladder is distended. There is no wall thickening. There are multiple calcified gallbladder stones. There is no significant dilation of bile ducts. Pancreas: There is prominence of pancreatic duct. No focal abnormality is seen. Spleen: Unremarkable. Adrenals/Urinary Tract: Adrenals are unremarkable. There is no hydronephrosis. There are no renal or ureteral stones. There are pockets of air in the lumen of the urinary bladder. Stomach/Bowel: Small hiatal hernia is seen. Stomach is not distended. Small bowel loops are not dilated. Appendix is not dilated. Scattered diverticula are seen in colon without signs of focal acute diverticulitis.  Vascular/Lymphatic: Scattered atherosclerotic plaques and calcifications are seen Reproductive: Uterus is not seen suggesting hysterectomy. There are no adnexal masses. Other: There is no ascites or pneumoperitoneum. There is interval repair of left inguinal hernia. Musculoskeletal: Unremarkable IMPRESSION: There is no evidence of intestinal obstruction or pneumoperitoneum. There is no hydronephrosis. Appendix is not dilated. Gallbladder is distended without wall thickening. Gallbladder stones are seen. There is no dilation of bile ducts. There is 1.7 cm cyst in the right lobe of liver. Few diverticula are seen in colon without signs of focal diverticulitis. Small hiatal hernia. There are pockets of air in the urinary bladder which may be due to recent catheterization or suggest cystitis. There is no bladder wall thickening. Other findings as described in the body of the report. Electronically Signed   By: Elmer Picker M.D.   On: 02/06/2022 16:16   US Abdomen Limited  Result Date: 02/07/2022 CLINICAL DATA:  RIGHT upper quadrant pain, nausea, and vomiting, history hypertension, dementia EXAM: ULTRASOUND ABDOMEN LIMITED RIGHT UPPER QUADRANT COMPARISON:  CT abdomen and pelvis 02/06/2022 FINDINGS: Gallbladder: Dependent sludge in gallbladder. Additional shadowing calculi up to 16 mm diameter. No gallbladder wall thickening, pericholecystic fluid, or sonographic Murphy sign. Common bile duct: Diameter: 3 mm, normal Liver: Normal echogenicity without mass or nodularity. Portal vein is patent on color Doppler imaging with normal direction of blood flow towards the liver. Other: No RIGHT upper quadrant free fluid. IMPRESSION: Cholelithiasis and sludge within gallbladder without evidence of acute cholecystitis or biliary dilatation. Electronically Signed   By: Lavonia Dana M.D.   On: 02/07/2022 17:45      Procedures Procedures    Medications Ordered in ED Medications  ondansetron (ZOFRAN) injection 4 mg  (has no administration in time range)  sodium chloride 0.9 % bolus 1,000 mL (has no administration in time range)    ED Course/ Medical Decision Making/ A&P                           Medical Decision Making Amount and/or Complexity of Data Reviewed Labs: ordered. Radiology: ordered.  Risk Prescription drug management.   BP (!) 146/70 (BP Location: Right Arm)    Pulse 81    Temp 97.9 F (36.6 C) (Oral)    Resp 18    SpO2 100%   34:62 PM 71 year old female with significant  history of dementia, hypercholesterolemia, hypertension, polypharmacy presenting complaining of nausea and vomiting.  For the past 3 to 4 days patient has had persistent nausea and vomiting and seems to be unable to keep anything down.  She vomits at least once or twice a day.  She also endorsed feeling constipated not relieved with MiraLAX.  Patient was initially seen and evaluated in the ED yesterday for her complaint.  CT scan of abdomen pelvis was obtained at that time without any acute finding.  Evidence of cholelithiasis.  No evidence of SBO.  The patient did tolerate p.o. and subsequently discharged home with Zofran.  She returns today stating that her symptoms still persist.  She still endorse a throbbing central headache.  No vision changes, no focal numbness or focal weakness.  She still endorse nausea with occasional bouts of nonbloody and nonbilious emesis.  She does not complain of any abdominal pain or back pain or dysuria.  Labs was obtained and independently reviewed interpreted by me.  Patient has normal WBC, normal H&H, electrolyte panels are reassuring, normal lipase.  On exam the patient has a fairly benign abdominal exam however given that patient has nausea and vomiting and having gallstones on CT scan have agreed to obtain a limited abdominal ultrasound to rule out biliary disease.  However I suspect symptoms likely viral in etiology.  I have also consider different differential diagnosis for patient's  headache.  However patient does not have any facial droop no focal neurodeficit no confusion no loss of vision no obvious finding to suggest either meningitis or stroke and do not think advanced imaging is indicated at this time.  No phonophobia or photophobia.  Patient received Fioricet with improvement of her headache.  Please note patient has history of polypharmacy and therefore I would like to limit the amount of medication prescribed.  Care discussed with Dr. Tyrone Nine  This patient presents to the ED for concern of nausea/vomiting, this involves an extensive number of treatment options, and is a complaint that carries with it a high risk of complications and morbidity.  The differential diagnosis includes SBO, cholelithiasis, pancreatitis, gastritis, viral GI, diverticular disease, underlying infection  Co morbidities that complicate the patient evaluation age  Hypercholesterolemia  dementia Additional history obtained:  Additional history obtained from husband at bedside External records from outside source obtained and reviewed including recent ER note from yesterday for similar visit  Lab Tests:  I Ordered, and personally interpreted labs.  The pertinent results include:  reassuring labs  Imaging Studies ordered:  I ordered imaging studies including limited abd [place].  I independently visualized and interpreted imaging which showed cholelithiasis without evidence of cholecystitis I agree with the radiologist interpretation  Cardiac Monitoring:  The patient was maintained on a cardiac monitor.  I personally viewed and interpreted the cardiac monitored which showed an underlying rhythm of: sinus rhythm  Medicines ordered and prescription drug management:  I ordered medication including zofran  for nausea Reevaluation of the patient after these medicines showed that the patient improved I have reviewed the patients home medicines and have made adjustments as needed  Test  Considered: as above  Critical Interventions: as above   Problem List / ED Course: viral illness  Nausea vomiting  Reevaluation:  After the interventions noted above, I reevaluated the patient and found that they have :improved  Social Determinants of Health: age  dementia  Dispostion:  After consideration of the diagnostic results and the patients response to treatment, I feel that the  patent would benefit from outpt f/u with PCP.         Final Clinical Impression(s) / ED Diagnoses Final diagnoses:  Bad headache  Nausea and vomiting, unspecified vomiting type  Calculus of gallbladder without cholecystitis without obstruction    Rx / DC Orders ED Discharge Orders     None         Domenic Moras, PA-C 02/07/22 1851    Carmin Muskrat, MD 02/08/22 1018

## 2022-02-07 NOTE — ED Triage Notes (Signed)
Pt. Stated, ive been throwing up since Saturday and I called my Dr. She said to come to ER

## 2022-02-07 NOTE — ED Notes (Signed)
Patient transported to Ultrasound 

## 2022-02-07 NOTE — Discharge Instructions (Addendum)
Please follow-up closely with your primary care doctor for further assessment of your condition.  Return to the ER promptly if your symptoms worsen or if you have any other concern.

## 2022-02-08 LAB — URINE CULTURE: Culture: NO GROWTH

## 2022-02-16 DIAGNOSIS — Z20822 Contact with and (suspected) exposure to covid-19: Secondary | ICD-10-CM | POA: Diagnosis not present

## 2022-02-27 ENCOUNTER — Other Ambulatory Visit: Payer: Self-pay | Admitting: *Deleted

## 2022-02-27 NOTE — Patient Outreach (Signed)
[facility] [facility]) Care Management [place] Note   02/27/2022 Name:  [name] MRN:  [id number] DOB:  [birth year]  Summary: Fasting blood sugar 104. A1C is 6.5.RN discussed what the A1C means. Per patient her appetite is poor. She has been supplementing her diet with Glucerna and Ensure. Patient has not had any recent falls. Per patient she has stopped exercising due to headaches. Patient has had some episodes of blood sugars in the 60's. RN discussed hypoglycemia and sent education information.   Recommendations/Changes made from today's visit: Patient will try to restart going to the gym     Subjective: [name] is an 71 y.o. year old female who is a primary patient of Carol Ada, MD. The care management team was consulted for assistance with care management and/or care coordination needs.    RN Health Coach completed Telephone Visit today.   Objective:  Medications Reviewed Today     Reviewed by Verlin Grills, RN (Case Manager) on 02/27/22 at 1038  Med List Status: <None>   Medication Order Taking? Sig Documenting Provider Last Dose Status Informant  acetaminophen (TYLENOL) 325 MG tablet [id number] Yes Take 2 tablets (650 mg total) by mouth every 6 (six) hours as needed for up to 30 doses for mild pain or moderate pain. Wyvonnia Dusky, MD Taking Active   amLODipine (NORVASC) 5 MG tablet [id number] Yes Take 5 mg by mouth daily. [provider] Taking Active Multiple Informants  Ascorbic Acid (VITAMIN C) 1000 MG tablet [id number] Yes Take 1,000 mg by mouth daily. [provider] Taking Active Self  atorvastatin (LIPITOR) 80 MG tablet [id number] Yes Take 80 mg by mouth daily. [provider] Taking Active Multiple Informants  calcium carbonate (OSCAL) 1500 (600 Ca) MG TABS tablet [id number] Yes Take by mouth daily. [provider] Taking Active Self  cephALEXin (KEFLEX) 500 MG capsule [id number] No Take 1 capsule  (500 mg total) by mouth 4 (four) times daily.  Patient not taking: Reported on 02/27/2022   Emeline Darling, PA-C Not Taking Active   chlorhexidine (PERIDEX) 0.12 % solution [id number]  Use as directed 15 mLs in the mouth or throat 2 (two) times daily. Volanda Napoleon, PA-C  Active   Cholecalciferol (VITAMIN D-3) 125 MCG (5000 UT) TABS [id number] Yes Take by mouth. [provider] Taking Active Self  DEXILANT 60 MG capsule [id number]  Take 1 capsule by mouth daily. [provider]  Active   diphenhydrAMINE (BENADRYL) 25 mg capsule [id number]  Take 25 mg by mouth every 6 (six) hours as needed. [provider]  Active Self  divalproex (DEPAKOTE ER) 500 MG 24 hr tablet [id number] No TAKE 1 TABLET BY MOUTH EVERY MORNING AND TAKE 2 TABLETS BY MOUTH EVERY EVENING  Patient not taking: Reported on 02/27/2022   Marcial Pacas, MD Not Taking Active Multiple Informants           Med Note Payton Doughty   Sat Jan 23, 2020  6:07 PM) [facility] states last filled June 2020  (#270) - pt states she is still taking 3 daily and has some at home  Erenumab-aooe (AIMOVIG, 140 MG DOSE, [place]) [id number]  Inject 140 mg into the skin every 30 (thirty) days. For migraine prevention [provider]  Active Multiple Informants  ezetimibe (ZETIA) 10 MG tablet [id number]  Take 1 tablet by mouth daily. [provider]  Active   ferrous sulfate 325 (65 FE) MG tablet [id number]  Take 325  mg by mouth daily with breakfast. [provider]  Active Multiple Informants           Med Note Sadie Haber Sep 12, 2020 11:38 AM) Need to refill  magnesium oxide (MAG-OX) 400 MG tablet [id number] Yes Take 500 mg by mouth daily. [provider] Taking Active Self  methocarbamol (ROBAXIN) 750 MG tablet [id number]   [provider]  Active            Med Note Sadie Haber Sep 12, 2020 11:33 AM) Dewaine Conger as needed  Multiple Vitamin (MULTIVITAMIN WITH MINERALS) TABS  [id number] Yes Take 1 tablet by mouth daily. [provider] Taking Active Multiple Informants  ondansetron (ZOFRAN-ODT) 4 MG disintegrating tablet [id number] Yes Take 1 tablet (4 mg total) by mouth every 8 (eight) hours as needed for nausea or vomiting. Sponseller, Gypsy Balsam, PA-C Taking Active   pyridOXINE (VITAMIN B-6) 100 MG tablet [id number] No Take 100 mg by mouth daily.  Patient not taking: Reported on 02/27/2022   [provider] Not Taking Active Self  SUMAtriptan-naproxen (TREXIMET) 85-500 MG tablet [id number] No Take 1 tablet by mouth 2 (two) times daily as needed for migraine. Max 2 tab per 24 hours, 1-2 days per week  Patient not taking: Reported on 02/27/2022   [provider] Not Taking Active Multiple Informants           Med Note Hubert Azure D   Tue Nov 29, 2020 12:07 PM)    topiramate (TOPAMAX) 100 MG tablet [id number] No Take 200 mg by mouth daily.   Patient not taking: Reported on 02/27/2022   [provider] Not Taking Active Multiple Informants  Ubrogepant (UBRELVY) 100 MG TABS [id number] Yes Take 100 mg by mouth daily as needed (migraines). [provider] Taking Active Multiple Informants  vitamin B-12 (CYANOCOBALAMIN) 100 MCG tablet [id number] Yes Take 100 mcg by mouth daily. [provider] Taking Active Self             SDOH:  (Social Determinants of Health) assessments and interventions performed:  SDOH Interventions    Flowsheet Row Most Recent Value  SDOH Interventions   Food Insecurity Interventions Intervention Not Indicated  Housing Interventions Intervention Not Indicated  Transportation Interventions Intervention Not Indicated       Care Plan  Review of patient past medical history, allergies, medications, health status, including review of consultants reports, laboratory and other test data, was performed as part of comprehensive evaluation for care management services.   Care Plan : Wellness  (Adult)  Updates made by Charita Lindenberger, Eppie Gibson, RN since 02/27/2022 12:00 AM     Problem: Health Literacy (Wellness) Resolved 02/27/2022  Priority: Medium  Note:   [id number] Resolving due to duplicate goal     Long-Range Goal: Health Literacy Improved Completed 02/27/2022  Start Date: 11/29/2020  Expected End Date: 08/22/2021  Recent Progress: On track  Priority: Medium  Note:   Evidence-based guidance:  Assess health literacy using Institute of Medicine recommended questions or standardized tools.  Use a universal method with health literacy to esure a nonjudgmental approach to varying levels of literacy.  Identify barriers to seeking or understanding information.  Note: Patient barriers may include literacy, language or culture, mental health, stigma, embarrassment; clinician barriers may include avoidance, time constraint, stereotype or bias.  Collaborate with interprofessional team and child and parent/caregiver to develop a structured yet individualized education plan that supports shared decision-making.  Consider a combination  of strategies such as print, audiotape, video and computer-based learning in a group or individual setting.  Encourage patient to seek health information and education in the community such as Art therapist, continuing education, English as a second language class, support group or peer Engineer, maintenance.  Provide educational materials that are written in plain language (3rd to 5th grade reading level) and include icons, pictures and tables; provide essential information first.   Notes:     Task: Identify Deficit and [place] Completed 02/27/2022  Due Date: 03/22/2022  Note:   Care Management Activities:    - education plan reviewed and/or amended - empathy and reassurance conveyed - family/caregiver participation in learning encouraged - health literacy screen reviewed - patient's preferred learning methods utilized - privacy ensured - questions  encouraged - readiness to learn monitored    Notes:  [id number] RN sent educational material on UTI    Problem: Cognitive Function (Wellness) Resolved 02/27/2022  Priority: High  Note:   [id number] Resolving due to duplicate goal     Goal: Cognitive Function Enhanced Completed 02/27/2022  Start Date: 11/29/2020  Expected End Date: 03/22/2022  Recent Progress: On track  Priority: Medium  Note:   Evidence-based guidance:  Assess changes in cognitive function using standardized assessment when available.  Encourage and refer for services that stimulate thinking, problem-solving and memory, such as cognitive training and cognitive rehabilitation.  Encourage physical activity or exercise based on ability and tolerance.  Encourage enjoyable activities that provide general stimulation for thinking, concentration and memory in a social setting or small group.   Notes:     Task: Identify and Optimize Mental Processes Completed 02/27/2022  Due Date: 03/22/2022  Note:   Care Management Activities:    - caregiver involvement in care and education encouraged - cognitive-stimulating activities promoted - consistent daily routine encouraged - glasses use encouraged - medication list reviewed - regular activity or exercise promoted - social relationships promoted    Notes: Encouraged to continue with outpatient therapy on hand and for activity    Care Plan : General Plan of Care (Adult)  Updates made by Raygan Skarda, Eppie Gibson, RN since 02/27/2022 12:00 AM     Problem: Coping Skills (General Plan of Care) Resolved 02/27/2022  Priority: High  Note:   [id number] Resolving due to duplicate goal     Goal: Coping Skills Enhanced Completed 02/27/2022  Note:   Evidence-based guidance:  Acknowledge, normalize and validate difficulty of making life-long lifestyle changes.  Identify current effective and ineffective coping strategies.  Encourage patient and caregiver participation in care to increase  self-esteem, confidence and feelings of control.  Consider alternative and complementary therapy approaches such as meditation, mindfulness or yoga.  Encourage participation in cognitive behavioral therapy to foster a positive identity, increase self-awareness, as well as bolster self-esteem, confidence and self-efficacy.  Discuss spirituality; be present as concerns are identified; encourage journaling, prayer, worship services, meditation or pastoral counseling.  Encourage participation in pleasurable group activities such as hobbies, singing, sports or volunteering).  Encourage the use of mindfulness; refer for training or intensive intervention.  Consider the use of meditative movement therapy such as tai chi, yoga or qigong.  Promote a regular daily exercise program based on tolerance, ability and patient choice to support positive thinking about disease or aging.   Notes:     Task: Support Psychosocial Response to Risk or Actual Health Condition Completed 02/27/2022  Due Date: 03/23/2021  Note:   Care Management Activities:    - active  listening utilized - caregiver stress acknowledged - current coping strategies identified - healthy lifestyle promoted - mindfulness encouraged - participation in counseling encouraged - self-reflection promoted - verbalization of feelings encouraged    Notes: [place] Social Work referral placed for depression resources    Care Plan : Diabetes Type 2 (Adult)  Updates made by Verlin Grills, RN since 02/27/2022 12:00 AM     Problem: Glycemic Management (Diabetes, Type 2) Resolved 02/27/2022  Priority: Medium  Note:   [id number] Resolving due to duplicate goal     Long-Range Goal: Glycemic Management Optimized Completed 02/27/2022  Start Date: 08/09/2021  Expected End Date: 03/22/2022  Recent Progress: On track  Priority: Medium  Note:   Evidence-based guidance:  Anticipate A1C testing (point-of-care) every 3 to 6 months based on goal attainment.   Review mutually-set A1C goal or target range.  Anticipate use of antihyperglycemic with or without insulin and periodic adjustments; consider active involvement of pharmacist.  Provide medical nutrition therapy and development of individualized eating.  Compare self-reported symptoms of hypo or hyperglycemia to blood glucose levels, diet and fluid intake, current medications, psychosocial and physiologic stressors, change in activity and barriers to care adherence.  Promote self-monitoring of blood glucose levels.  Assess and address barriers to management plan, such as food insecurity, age, developmental ability, depression, anxiety, fear of hypoglycemia or weight gain, as well as medication cost, side effects and complicated regimen.  Consider referral to community-based diabetes education program, visiting nurse, community health worker or health coach.  Encourage regular dental care for treatment of periodontal disease; refer to dental provider when needed.   Notes:      Task: Alleviate Barriers to Glycemic Management Completed 02/27/2022  Due Date: 03/22/2022  Note:   Care Management Activities:    - barriers to adherence to treatment plan identified - blood glucose monitoring encouraged - blood glucose readings reviewed - resources required to improve adherence to care identified - self-awareness of signs/symptoms of hypo or hyperglycemia encouraged - use of blood glucose monitoring log promoted    Notes: encouraged to check fingerstick blood sugar occasionally to ensure accuracy or freestyle libre system [id number] Patient is     Care Plan : [place] of Care  Updates made by Latanza Pfefferkorn, Eppie Gibson, RN since 02/27/2022 12:00 AM     Problem: Knowledge Deficit Related to Diabetes and Care Coordination Needs   Priority: High     Long-Range Goal: Development Plan of Care for Management of Diabetes   Start Date: 02/27/2022  Expected End Date: 02/27/2022  Priority: High  Note:    Current Barriers:  Knowledge Deficits related to plan of care for management of DMII   RNCM Clinical Goal(s):  Patient will verbalize understanding of plan for management of DMII as evidenced by continuation of monitoring blood sugars and adhering to diabetic diet  through collaboration with RN Care manager, provider, and care team.   Interventions: Inter-disciplinary care team collaboration (see longitudinal plan of care) Evaluation of current treatment plan related to  self management and patient's adherence to plan as established by provider   Diabetes Interventions:  (Status:  Goal on track:  Yes.) Long Term Goal Assessed patient's understanding of A1c goal: <7% Provided education to patient about basic DM disease process Reviewed medications with patient and discussed importance of medication adherence Counseled on importance of regular laboratory monitoring as prescribed Provided patient with written educational materials related to hypo and hyperglycemia and importance of correct treatment Advised patient, providing  education and rationale, to check cbg four times a day and record, calling Dr for findings outside established parameters Lab Results  Component Value Date   HGBA1C 5.3 10/29/2018   Patient Goals/Self-Care Activities: Take all medications as prescribed Attend all scheduled provider appointments Call pharmacy for medication refills 3-7 days in advance of running out of medications Perform all self care activities independently  Perform IADL's (shopping, preparing meals, housekeeping, managing finances) independently Call provider office for new concerns or questions  call the Suicide and Crisis Lifeline: 988 if experiencing a Mental Health or [place]  schedule appointment with eye doctor check blood sugar at prescribed times: 4 times daily check feet daily for cuts, sores or redness trim toenails straight across wash and dry feet carefully every  day wear comfortable, cotton socks wear comfortable, well-fitting shoes  Follow Up Plan:  Telephone follow up appointment with care management team member scheduled for:  June 2023 The patient has been provided with contact information for the care management team and has been advised to call with any health related questions or concerns.    [id number] Per patient her fasting blood sugar was 104.  Her A1C is 6.5. Patient stated her appetite is poor. Patient stated she is supplementing her diet with Boost and Glucerna.  Patient has not had any recent falls. Patient stated she has not been exercising lately due to the headaches.       Plan: Telephone follow up appointment with care management team member scheduled for:  May 28, 2022 The patient has been provided with contact information for the care management team and has been advised to call with any health related questions or concerns.  RN provided educational information on Hypoglycemia  [place] Management [phone]

## 2022-02-27 NOTE — Patient Instructions (Signed)
Visit Information ? ?Thank you for taking time to visit with me today. Please don't hesitate to contact me if I can be of assistance to you before our next scheduled telephone appointment. ? ?Following are the goals we discussed today:  ?Current Barriers:  ?Knowledge Deficits related to plan of care for management of DMII  ? ?RNCM Clinical Goal(s):  ?Patient will verbalize understanding of plan for management of DMII as evidenced by continuation of monitoring blood sugars and adhering to diabetic diet through collaboration with RN Care manager, provider, and care team.  ? ?Interventions: ?Inter-disciplinary care team collaboration (see longitudinal plan of care) ?Evaluation of current treatment plan related to  self management and patient's adherence to plan as established by provider ? ? ?Diabetes Interventions:  (Status:  Goal on track:  Yes.) Long Term Goal ?Assessed patient's understanding of A1c goal: <7% ?Provided education to patient about basic DM disease process ?Reviewed medications with patient and discussed importance of medication adherence ?Counseled on importance of regular laboratory monitoring as prescribed ?Provided patient with written educational materials related to hypo and hyperglycemia and importance of correct treatment ?Advised patient, providing education and rationale, to check cbg four times a day and record, calling Dr for findings outside established parameters ?Lab Results  ?Component Value Date  ? HGBA1C 5.3 10/29/2018  ? ? ?Patient Goals/Self-Care Activities: ?Take all medications as prescribed ?Attend all scheduled provider appointments ?Call pharmacy for medication refills 3-7 days in advance of running out of medications ?Perform all self care activities independently  ?Perform IADL's (shopping, preparing meals, housekeeping, managing finances) independently ?Call provider office for new concerns or questions  ?call the Suicide and Crisis Lifeline: 988 if experiencing a Mental  Health or [place]  ?schedule appointment with eye doctor ?check blood sugar at prescribed times: 4 times daily ?check feet daily for cuts, sores or redness ?trim toenails straight across ?wash and dry feet carefully every day ?wear comfortable, cotton socks ?wear comfortable, well-fitting shoes ? ?Follow Up Plan:  Telephone follow up appointment with care management team member scheduled for:  June 2023 ?The patient has been provided with contact information for the care management team and has been advised to call with any health related questions or concerns.   ? ?[id number] Per patient her fasting blood sugar was 104.  Her A1C is 6.5. Patient stated her appetite is poor. Patient stated she is supplementing her diet with Boost and Glucerna.  Patient has not had any recent falls. Patient stated she has not been exercising lately due to the headaches.  ? ?Our next appointment is by telephone on June 5,2023 ? ?Please call Johny Shock RN at [phone] if you need to cancel or reschedule your appointment.  ? ?Please call the Suicide and Crisis Lifeline: 988 if you are experiencing a Mental Health or [place] or need someone to talk to. ? ?The patient verbalized understanding of instructions, educational materials, and care plan provided today and agreed to receive a mailed copy of patient instructions, educational materials, and care plan.  ? ?Telephone follow up appointment with care management team member scheduled for: ?The patient has been provided with contact information for the care management team and has been advised to call with any health related questions or concerns.  ? ?SIGNATURE ? ?Johny Shock BSN RN ?[place] Management ?[phone] ? ? ?  ?

## 2022-03-21 DIAGNOSIS — Z20822 Contact with and (suspected) exposure to covid-19: Secondary | ICD-10-CM | POA: Diagnosis not present

## 2022-03-23 ENCOUNTER — Other Ambulatory Visit: Payer: Self-pay | Admitting: *Deleted

## 2022-03-23 DIAGNOSIS — N200 Calculus of kidney: Secondary | ICD-10-CM | POA: Diagnosis not present

## 2022-03-23 DIAGNOSIS — K7689 Other specified diseases of liver: Secondary | ICD-10-CM | POA: Diagnosis not present

## 2022-03-23 NOTE — Patient Outreach (Signed)
[facility] [facility]) Care Management ? ?03/23/2022 ? ?[name] ?06-14-1951 ?[id number] ? ? ?RN case closure reason: MD office has RN CM that will follow patient. ? ?Plan: ?RN sent patient case closure letter. ? ? ?Johny Shock BSN RN ?[place] Management ?[phone] ? ?

## 2022-03-28 DIAGNOSIS — Z20822 Contact with and (suspected) exposure to covid-19: Secondary | ICD-10-CM | POA: Diagnosis not present

## 2022-03-28 DIAGNOSIS — G43109 Migraine with aura, not intractable, without status migrainosus: Secondary | ICD-10-CM | POA: Diagnosis not present

## 2022-03-29 DIAGNOSIS — Z20828 Contact with and (suspected) exposure to other viral communicable diseases: Secondary | ICD-10-CM | POA: Diagnosis not present

## 2022-03-30 DIAGNOSIS — Z20822 Contact with and (suspected) exposure to covid-19: Secondary | ICD-10-CM | POA: Diagnosis not present

## 2022-04-03 DIAGNOSIS — Z20822 Contact with and (suspected) exposure to covid-19: Secondary | ICD-10-CM | POA: Diagnosis not present

## 2022-04-09 DIAGNOSIS — R32 Unspecified urinary incontinence: Secondary | ICD-10-CM | POA: Diagnosis not present

## 2022-04-11 DIAGNOSIS — Z20822 Contact with and (suspected) exposure to covid-19: Secondary | ICD-10-CM | POA: Diagnosis not present

## 2022-04-13 DIAGNOSIS — Z20822 Contact with and (suspected) exposure to covid-19: Secondary | ICD-10-CM | POA: Diagnosis not present

## 2022-04-27 DIAGNOSIS — Z20822 Contact with and (suspected) exposure to covid-19: Secondary | ICD-10-CM | POA: Diagnosis not present

## 2022-04-30 DIAGNOSIS — Z20822 Contact with and (suspected) exposure to covid-19: Secondary | ICD-10-CM | POA: Diagnosis not present

## 2022-05-01 ENCOUNTER — Ambulatory Visit
Admission: RE | Admit: 2022-05-01 | Discharge: 2022-05-01 | Disposition: A | Discharge status: Home / Self Care | Payer: Medicare Other | Source: Ambulatory Visit | Attending: Internal Medicine | Admitting: Internal Medicine

## 2022-05-01 DIAGNOSIS — D329 Benign neoplasm of meninges, unspecified: Secondary | ICD-10-CM | POA: Diagnosis not present

## 2022-05-01 DIAGNOSIS — G319 Degenerative disease of nervous system, unspecified: Secondary | ICD-10-CM | POA: Diagnosis not present

## 2022-05-01 MED ORDER — GADOBENATE DIMEGLUMINE 529 MG/ML IV SOLN
10.0000 mL | Freq: Once | INTRAVENOUS | Status: AC | PRN
  Administered 2022-05-01: 10 mL via INTRAVENOUS

## 2022-05-03 ENCOUNTER — Other Ambulatory Visit: Payer: Self-pay

## 2022-05-03 ENCOUNTER — Inpatient Hospital Stay: Payer: Medicare Other | Attending: Internal Medicine | Admitting: Internal Medicine

## 2022-05-03 VITALS — BP 143/59 | HR 73 | Temp 97.7°F | Resp 16 | Ht 60.0 in | Wt 120.7 lb

## 2022-05-03 DIAGNOSIS — D329 Benign neoplasm of meninges, unspecified: Secondary | ICD-10-CM | POA: Diagnosis not present

## 2022-05-03 DIAGNOSIS — Z8249 Family history of ischemic heart disease and other diseases of the circulatory system: Secondary | ICD-10-CM | POA: Diagnosis not present

## 2022-05-03 DIAGNOSIS — R051 Acute cough: Secondary | ICD-10-CM | POA: Diagnosis not present

## 2022-05-03 DIAGNOSIS — Z20822 Contact with and (suspected) exposure to covid-19: Secondary | ICD-10-CM | POA: Diagnosis not present

## 2022-05-03 DIAGNOSIS — Z801 Family history of malignant neoplasm of trachea, bronchus and lung: Secondary | ICD-10-CM | POA: Diagnosis not present

## 2022-05-03 DIAGNOSIS — D320 Benign neoplasm of cerebral meninges: Secondary | ICD-10-CM | POA: Insufficient documentation

## 2022-05-03 DIAGNOSIS — Z841 Family history of disorders of kidney and ureter: Secondary | ICD-10-CM | POA: Diagnosis not present

## 2022-05-03 DIAGNOSIS — G43909 Migraine, unspecified, not intractable, without status migrainosus: Secondary | ICD-10-CM | POA: Insufficient documentation

## 2022-05-03 DIAGNOSIS — G40909 Epilepsy, unspecified, not intractable, without status epilepticus: Secondary | ICD-10-CM

## 2022-05-03 DIAGNOSIS — Z886 Allergy status to analgesic agent status: Secondary | ICD-10-CM | POA: Diagnosis not present

## 2022-05-03 DIAGNOSIS — R059 Cough, unspecified: Secondary | ICD-10-CM | POA: Diagnosis not present

## 2022-05-03 DIAGNOSIS — Z79899 Other long term (current) drug therapy: Secondary | ICD-10-CM | POA: Insufficient documentation

## 2022-05-03 NOTE — Progress Notes (Signed)
? ?[facility] at [address]  ?[place], [place] [address] ?[phone] ? ? ?Interval Evaluation ? ?Date of Service: 05/03/22 ?Patient Name: [name] ?Patient MRN: [id number] ?Patient DOB: July 12, 1951 ?Provider: Ventura Sellers, MD ? ?Identifying Statement:  ?[name] is a 71 y.o. female with left frontal meningioma  ? ?Interval History: ? [name] presents today following recent MRI brain.  She denies new or progressive neurologic deficits today.  Migraine or daily headaches continue, she is on new prevention medicine through [facility] team.  Denies seizures. ? ?Medications: ?Current Outpatient Medications on File Prior to Visit  ?Medication Sig Dispense Refill  ? acetaminophen (TYLENOL) 325 MG tablet Take 2 tablets (650 mg total) by mouth every 6 (six) hours as needed for up to 30 doses for mild pain or moderate pain. 30 tablet 0  ? amLODipine (NORVASC) 5 MG tablet Take 5 mg by mouth daily.    ? Ascorbic Acid (VITAMIN C) 1000 MG tablet Take 1,000 mg by mouth daily.    ? atorvastatin (LIPITOR) 80 MG tablet Take 80 mg by mouth daily.    ? calcium carbonate (OSCAL) 1500 (600 Ca) MG TABS tablet Take by mouth daily.    ? Cholecalciferol (VITAMIN D-3) 125 MCG (5000 UT) TABS Take by mouth.    ? DEXILANT 60 MG capsule Take 1 capsule by mouth daily.    ? diphenhydrAMINE (BENADRYL) 25 mg capsule Take 25 mg by mouth every 6 (six) hours as needed.    ? ezetimibe (ZETIA) 10 MG tablet Take 1 tablet by mouth daily.    ? ferrous sulfate 325 (65 FE) MG tablet Take 325 mg by mouth daily with breakfast.    ? magnesium oxide (MAG-OX) 400 MG tablet Take 500 mg by mouth daily.    ? Multiple Vitamin (MULTIVITAMIN WITH MINERALS) TABS Take 1 tablet by mouth daily.    ? pyridOXINE (VITAMIN B-6) 100 MG tablet Take 100 mg by mouth daily. (Patient not taking: Reported on 02/27/2022)    ? Ubrogepant (UBRELVY) 100 MG TABS Take 100 mg by mouth daily as needed (migraines).    ? vitamin B-12  (CYANOCOBALAMIN) 100 MCG tablet Take 100 mcg by mouth daily.    ? ?No current facility-administered medications on file prior to visit.  ? ? ?Allergies:  ?Allergies  ?Allergen Reactions  ? Aspirin Nausea Only and Other (See Comments)  ?  ulcers ?  ? Nsaids Other (See Comments)  ?  Can cause ulcers  ? Tolmetin Other (See Comments)  ?  Can cause ulcers  ? ?Past Medical History:  ?Past Medical History:  ?Diagnosis Date  ? Cataract   ? Dementia ([place])   ? Depression   ? denies  ? Displaced fracture   ? left ring finger and small finger proximal phalanx  ? Epilepsy, grand mal ([place])   ? "last seizure ~ 2001" (06/16/2015)  ? High cholesterol   ? Hypertension   ? Migraine   ? "just about qd";  treated by Dr. [name] (10/15/2016)  ? Migraines   ? Pneumonia   ? h/o  ? Pre-diabetes   ? she reports that she never took meds. for prediabetes, states MD has told her that she has corrected her situation with diet   ? Seizures ([place])   ? "at one time; stopped in the 1990s" (10/15/2016)  ? Stress at home   ? Pt. reports that she has a lot of personal stress & she  is aware that it might being playing a part in her Migraine heaaches    ? ?Past Surgical History:  ?Past Surgical History:  ?Procedure Laterality Date  ? EYE SURGERY Left 2018  ? left eye cataract removed  ? HERNIA REPAIR    ? INCISIONAL HERNIA REPAIR N/A 06/16/2015  ? Procedure: LAPAROSCOPIC INCISIONAL HERNIA WITH MESH;  Surgeon: Coralie Keens, MD;  Location: [facility];  Service: General;  Laterality: N/A;  ? [place]  10/15/2016  ? INCISIONAL HERNIA REPAIR N/A 10/15/2016  ? Procedure: OPEN INCISIONAL HERNIA REPAIR WITH MESH;  Surgeon: Coralie Keens, MD;  Location: [facility];  Service: General;  Laterality: N/A;  ? INCISIONAL HERNIA REPAIR Left 11/05/2018  ? open; w/mesh  ? INGUINAL HERNIA REPAIR Left 11/05/2018  ? Procedure: OPEN REPAIR LEFT HERNIA ERAS PATHWAY;  Surgeon: Coralie Keens, MD;  Location: [facility];  Service: General;  Laterality: Left;  TAP BLOCK  ?  INSERTION OF MESH N/A 06/16/2015  ? Procedure: INSERTION OF MESH;  Surgeon: Coralie Keens, MD;  Location: [facility];  Service: General;  Laterality: N/A;  ? VAGINAL HYSTERECTOMY  ~ 1992  ? ?Social History:  ?Social History  ? ?Socioeconomic History  ? Marital status: Married  ?  Spouse name: Elenore Rota  ? Number of children: 3  ? Years of education: college  ? Highest education level: Not on file  ?Occupational History  ?  Comment: does not work  ?Tobacco Use  ? Smoking status: Never  ? Smokeless tobacco: Never  ?Vaping Use  ? Vaping Use: Never used  ?Substance and Sexual Activity  ? Alcohol use: No  ?  Alcohol/week: 0.0 standard drinks  ? Drug use: No  ? Sexual activity: Yes  ?Other Topics Concern  ? Not on file  ?Social History Narrative  ? Patient lives at home with her husband Elenore Rota). Patient was a homemaker.  ? Right handed.  ? College education.  ? Caffeine- One cup of coffee daily.  ? Patient has three children.  ? ?Social Determinants of Health  ? ?Financial Resource Strain: Not on file  ?Food Insecurity: No Food Insecurity  ? Worried About Charity fundraiser in the Last Year: Never true  ? Ran Out of Food in the Last Year: Never true  ?Transportation Needs: No Transportation Needs  ? Lack of Transportation (Medical): No  ? Lack of Transportation (Non-Medical): No  ?Physical Activity: Not on file  ?Stress: Not on file  ?Social Connections: Not on file  ?Intimate Partner Violence: Not on file  ? ?Family History:  ?Family History  ?Problem Relation Age of Onset  ? Lung cancer Mother   ? Kidney failure Father   ? Hypertension Other   ? ? ?Review of Systems: ?Constitutional: Doesn't report fevers, chills or abnormal weight loss ?Eyes: Doesn't report blurriness of vision ?Ears, nose, mouth, throat, and face: Doesn't report sore throat ?Respiratory: Doesn't report cough, dyspnea or wheezes ?Cardiovascular: Doesn't report palpitation, chest discomfort  ?Gastrointestinal:  Doesn't report nausea, constipation,  diarrhea ?GU: Doesn't report incontinence ?Skin: Doesn't report skin rashes ?Neurological: Per HPI ?Musculoskeletal: Doesn't report joint pain ?Behavioral/Psych: Doesn't report anxiety ? ?Physical Exam: ?Vitals:  ? 05/03/22 1138  ?BP: (!) 143/59  ?Pulse: 73  ?Resp: 16  ?Temp: 97.7 ?F (36.5 ?C)  ?SpO2: 98%  ? ?KPS: 90. ?General: Alert, cooperative, pleasant, in no acute distress ?Head: Normal ?EENT: No conjunctival injection or scleral icterus.  ?Lungs: Resp effort normal ?Cardiac: Regular rate ?Abdomen: Non-distended abdomen ?Skin: No  rashes cyanosis or petechiae. ?Extremities: No clubbing or edema ? ?Neurologic Exam: ?Mental Status: Awake, alert, attentive to examiner. Oriented to self and environment. Language is fluent with intact comprehension.  ?Cranial Nerves: Visual acuity is grossly normal. Visual fields are full. Extra-ocular movements intact. No ptosis. Face is symmetric ?Motor: Tone and bulk are normal. Power is full in both arms and legs. Reflexes are symmetric, no pathologic reflexes present.  ?Sensory: Intact to light touch ?Gait: Normal. ? ? ?Labs: ?I have reviewed the data as listed ?   ?Component Value Date/Time  ? NA 136 02/07/2022 1019  ? K 4.2 02/07/2022 1019  ? CL 100 02/07/2022 1019  ? CO2 22 02/07/2022 1019  ? GLUCOSE 77 02/07/2022 1019  ? BUN 20 02/07/2022 1019  ? CREATININE 1.07 (H) 02/07/2022 1019  ? CALCIUM 10.0 02/07/2022 1019  ? PROT 7.3 02/07/2022 1019  ? ALBUMIN 4.3 02/07/2022 1019  ? AST 28 02/07/2022 1019  ? ALT 27 02/07/2022 1019  ? ALKPHOS 60 02/07/2022 1019  ? BILITOT 0.4 02/07/2022 1019  ? GFRNONAA 55 (L) 02/07/2022 1019  ? GFRAA >60 11/21/2019 1549  ? ?Lab Results  ?Component Value Date  ? WBC 10.3 02/07/2022  ? NEUTROABS 6.8 02/07/2022  ? HGB 13.8 02/07/2022  ? HCT 43.0 02/07/2022  ? MCV 93.1 02/07/2022  ? PLT 357 02/07/2022  ? ? ?Imaging: ?[facility] Clinician Interpretation: I have personally reviewed the CNS images as listed.  My interpretation, in the context of the patient's  clinical presentation, is stable disease ? ?MR BRAIN W WO CONTRAST ? ?Result Date: 05/02/2022 ?CLINICAL DATA:  Follow-up meningioma EXAM: MRI HEAD WITHOUT AND WITH CONTRAST TECHNIQUE: Multiplanar, multiecho pulse sequences of t

## 2022-05-07 ENCOUNTER — Other Ambulatory Visit: Payer: Self-pay | Admitting: *Deleted

## 2022-05-07 DIAGNOSIS — D329 Benign neoplasm of meninges, unspecified: Secondary | ICD-10-CM

## 2022-05-28 ENCOUNTER — Ambulatory Visit: Payer: Medicare Other | Admitting: *Deleted

## 2022-05-28 DIAGNOSIS — N3001 Acute cystitis with hematuria: Secondary | ICD-10-CM | POA: Diagnosis not present

## 2022-05-28 DIAGNOSIS — R32 Unspecified urinary incontinence: Secondary | ICD-10-CM | POA: Diagnosis not present

## 2022-07-18 DIAGNOSIS — G43109 Migraine with aura, not intractable, without status migrainosus: Secondary | ICD-10-CM | POA: Diagnosis not present

## 2022-07-19 DIAGNOSIS — E1169 Type 2 diabetes mellitus with other specified complication: Secondary | ICD-10-CM | POA: Diagnosis not present

## 2022-07-19 DIAGNOSIS — E785 Hyperlipidemia, unspecified: Secondary | ICD-10-CM | POA: Diagnosis not present

## 2022-07-19 DIAGNOSIS — K219 Gastro-esophageal reflux disease without esophagitis: Secondary | ICD-10-CM | POA: Diagnosis not present

## 2022-07-19 DIAGNOSIS — Z0000 Encounter for general adult medical examination without abnormal findings: Secondary | ICD-10-CM | POA: Diagnosis not present

## 2022-07-19 DIAGNOSIS — Z1331 Encounter for screening for depression: Secondary | ICD-10-CM | POA: Diagnosis not present

## 2022-07-19 DIAGNOSIS — I10 Essential (primary) hypertension: Secondary | ICD-10-CM | POA: Diagnosis not present

## 2022-07-19 DIAGNOSIS — G43909 Migraine, unspecified, not intractable, without status migrainosus: Secondary | ICD-10-CM | POA: Diagnosis not present

## 2022-07-19 DIAGNOSIS — G479 Sleep disorder, unspecified: Secondary | ICD-10-CM | POA: Diagnosis not present

## 2022-08-29 ENCOUNTER — Other Ambulatory Visit: Payer: Self-pay | Admitting: Family Medicine

## 2022-08-29 DIAGNOSIS — K219 Gastro-esophageal reflux disease without esophagitis: Secondary | ICD-10-CM | POA: Diagnosis not present

## 2022-08-29 DIAGNOSIS — N182 Chronic kidney disease, stage 2 (mild): Secondary | ICD-10-CM | POA: Diagnosis not present

## 2022-08-29 DIAGNOSIS — Z1231 Encounter for screening mammogram for malignant neoplasm of breast: Secondary | ICD-10-CM

## 2022-08-29 DIAGNOSIS — E7800 Pure hypercholesterolemia, unspecified: Secondary | ICD-10-CM | POA: Diagnosis not present

## 2022-08-29 DIAGNOSIS — E1169 Type 2 diabetes mellitus with other specified complication: Secondary | ICD-10-CM | POA: Diagnosis not present

## 2022-08-29 DIAGNOSIS — I10 Essential (primary) hypertension: Secondary | ICD-10-CM | POA: Diagnosis not present

## 2022-08-30 ENCOUNTER — Other Ambulatory Visit: Payer: Self-pay | Admitting: Family Medicine

## 2022-08-30 DIAGNOSIS — E2839 Other primary ovarian failure: Secondary | ICD-10-CM

## 2022-09-03 ENCOUNTER — Other Ambulatory Visit: Payer: Self-pay | Admitting: Family Medicine

## 2022-09-03 DIAGNOSIS — N3001 Acute cystitis with hematuria: Secondary | ICD-10-CM | POA: Diagnosis not present

## 2022-09-03 DIAGNOSIS — E2839 Other primary ovarian failure: Secondary | ICD-10-CM

## 2022-09-03 DIAGNOSIS — R32 Unspecified urinary incontinence: Secondary | ICD-10-CM | POA: Diagnosis not present

## 2022-09-05 DIAGNOSIS — N8110 Cystocele, unspecified: Secondary | ICD-10-CM | POA: Diagnosis not present

## 2022-09-19 DIAGNOSIS — G43709 Chronic migraine without aura, not intractable, without status migrainosus: Secondary | ICD-10-CM | POA: Diagnosis not present

## 2022-09-20 ENCOUNTER — Ambulatory Visit
Admission: RE | Admit: 2022-09-20 | Discharge: 2022-09-20 | Disposition: A | Discharge status: Home / Self Care | Payer: Medicare Other | Source: Ambulatory Visit | Attending: Physician Assistant | Admitting: Physician Assistant

## 2022-09-20 ENCOUNTER — Other Ambulatory Visit: Payer: Self-pay | Admitting: Physician Assistant

## 2022-09-20 DIAGNOSIS — R634 Abnormal weight loss: Secondary | ICD-10-CM

## 2022-09-20 DIAGNOSIS — F322 Major depressive disorder, single episode, severe without psychotic features: Secondary | ICD-10-CM | POA: Diagnosis not present

## 2022-09-20 DIAGNOSIS — F411 Generalized anxiety disorder: Secondary | ICD-10-CM | POA: Diagnosis not present

## 2022-09-23 DIAGNOSIS — R634 Abnormal weight loss: Secondary | ICD-10-CM | POA: Diagnosis not present

## 2022-10-04 DIAGNOSIS — E46 Unspecified protein-calorie malnutrition: Secondary | ICD-10-CM | POA: Diagnosis not present

## 2022-10-04 DIAGNOSIS — R634 Abnormal weight loss: Secondary | ICD-10-CM | POA: Diagnosis not present

## 2022-10-18 DIAGNOSIS — R634 Abnormal weight loss: Secondary | ICD-10-CM | POA: Diagnosis not present

## 2022-10-18 DIAGNOSIS — F324 Major depressive disorder, single episode, in partial remission: Secondary | ICD-10-CM | POA: Diagnosis not present

## 2022-10-23 DIAGNOSIS — H26491 Other secondary cataract, right eye: Secondary | ICD-10-CM | POA: Diagnosis not present

## 2022-10-23 DIAGNOSIS — H524 Presbyopia: Secondary | ICD-10-CM | POA: Diagnosis not present

## 2022-10-23 DIAGNOSIS — H52203 Unspecified astigmatism, bilateral: Secondary | ICD-10-CM | POA: Diagnosis not present

## 2022-10-23 DIAGNOSIS — H43813 Vitreous degeneration, bilateral: Secondary | ICD-10-CM | POA: Diagnosis not present

## 2022-10-23 DIAGNOSIS — E119 Type 2 diabetes mellitus without complications: Secondary | ICD-10-CM | POA: Diagnosis not present

## 2022-11-06 ENCOUNTER — Ambulatory Visit
Admission: RE | Admit: 2022-11-06 | Discharge: 2022-11-06 | Disposition: A | Discharge status: Home / Self Care | Payer: Medicare Other | Source: Ambulatory Visit | Attending: Family Medicine | Admitting: Family Medicine

## 2022-11-06 DIAGNOSIS — Z1231 Encounter for screening mammogram for malignant neoplasm of breast: Secondary | ICD-10-CM | POA: Diagnosis not present

## 2022-11-19 DIAGNOSIS — N3001 Acute cystitis with hematuria: Secondary | ICD-10-CM | POA: Diagnosis not present

## 2022-11-19 DIAGNOSIS — R32 Unspecified urinary incontinence: Secondary | ICD-10-CM | POA: Diagnosis not present

## 2022-12-12 DIAGNOSIS — Z23 Encounter for immunization: Secondary | ICD-10-CM | POA: Diagnosis not present

## 2023-02-08 ENCOUNTER — Ambulatory Visit
Admission: RE | Admit: 2023-02-08 | Discharge: 2023-02-08 | Disposition: A | Discharge status: Home / Self Care | Payer: Medicare HMO | Source: Ambulatory Visit | Attending: Family Medicine | Admitting: Family Medicine

## 2023-02-08 DIAGNOSIS — E2839 Other primary ovarian failure: Secondary | ICD-10-CM

## 2023-04-30 ENCOUNTER — Other Ambulatory Visit: Payer: Medicare Other

## 2023-05-01 ENCOUNTER — Telehealth: Payer: Self-pay | Admitting: *Deleted

## 2023-05-01 NOTE — Telephone Encounter (Signed)
LM that we are cancelling the appt with Dr Barbaraann Cao for tomorrow. Will get it rescheduled after MRI is authorized and scheduled.

## 2023-05-02 ENCOUNTER — Inpatient Hospital Stay: Payer: Medicare Other | Admitting: Internal Medicine

## 2023-05-13 ENCOUNTER — Other Ambulatory Visit: Payer: Self-pay | Admitting: *Deleted

## 2023-05-13 DIAGNOSIS — D329 Benign neoplasm of meninges, unspecified: Secondary | ICD-10-CM

## 2023-06-03 ENCOUNTER — Ambulatory Visit
Admission: RE | Admit: 2023-06-03 | Discharge: 2023-06-03 | Disposition: A | Discharge status: Home / Self Care | Payer: Medicare HMO | Source: Ambulatory Visit | Attending: Internal Medicine | Admitting: Internal Medicine

## 2023-06-03 DIAGNOSIS — D329 Benign neoplasm of meninges, unspecified: Secondary | ICD-10-CM

## 2023-06-03 MED ORDER — GADOPICLENOL 0.5 MMOL/ML IV SOLN
6.0000 mL | Freq: Once | INTRAVENOUS | Status: AC | PRN
  Administered 2023-06-03: 6 mL via INTRAVENOUS

## 2023-06-17 ENCOUNTER — Telehealth: Payer: Self-pay | Admitting: Internal Medicine

## 2023-06-17 NOTE — Telephone Encounter (Signed)
Called patient regarding July appointments, patient is notified.  

## 2023-06-21 ENCOUNTER — Other Ambulatory Visit: Payer: Medicare Other

## 2023-06-24 ENCOUNTER — Ambulatory Visit: Payer: Medicare Other | Admitting: Internal Medicine

## 2023-06-24 ENCOUNTER — Other Ambulatory Visit: Payer: Self-pay

## 2023-06-24 ENCOUNTER — Inpatient Hospital Stay: Payer: Medicare HMO | Attending: Internal Medicine | Admitting: Internal Medicine

## 2023-06-24 VITALS — BP 190/96 | HR 94 | Temp 97.6°F | Resp 17 | Wt 127.6 lb

## 2023-06-24 DIAGNOSIS — Z801 Family history of malignant neoplasm of trachea, bronchus and lung: Secondary | ICD-10-CM | POA: Insufficient documentation

## 2023-06-24 DIAGNOSIS — G40909 Epilepsy, unspecified, not intractable, without status epilepticus: Secondary | ICD-10-CM | POA: Diagnosis not present

## 2023-06-24 DIAGNOSIS — Z886 Allergy status to analgesic agent status: Secondary | ICD-10-CM | POA: Insufficient documentation

## 2023-06-24 DIAGNOSIS — D320 Benign neoplasm of cerebral meninges: Secondary | ICD-10-CM | POA: Diagnosis present

## 2023-06-24 DIAGNOSIS — G43909 Migraine, unspecified, not intractable, without status migrainosus: Secondary | ICD-10-CM | POA: Insufficient documentation

## 2023-06-24 DIAGNOSIS — Z8249 Family history of ischemic heart disease and other diseases of the circulatory system: Secondary | ICD-10-CM | POA: Diagnosis not present

## 2023-06-24 DIAGNOSIS — I10 Essential (primary) hypertension: Secondary | ICD-10-CM | POA: Insufficient documentation

## 2023-06-24 DIAGNOSIS — D329 Benign neoplasm of meninges, unspecified: Secondary | ICD-10-CM | POA: Diagnosis not present

## 2023-06-24 DIAGNOSIS — Z841 Family history of disorders of kidney and ureter: Secondary | ICD-10-CM | POA: Diagnosis not present

## 2023-06-24 DIAGNOSIS — Z90710 Acquired absence of both cervix and uterus: Secondary | ICD-10-CM | POA: Insufficient documentation

## 2023-06-24 DIAGNOSIS — Z79899 Other long term (current) drug therapy: Secondary | ICD-10-CM | POA: Insufficient documentation

## 2023-06-24 NOTE — Progress Notes (Signed)
[facility] at [facility] [address]  [place], [place] [address] [phone]   Interval Evaluation  Date of Service: 06/24/23 Patient Name: [name] Patient MRN: [id number] Patient DOB: 05/04/1951 Provider: Henreitta Leber, MD  Identifying Statement:  [name] is a 72 y.o. female with left frontal meningioma   Interval History:  [name] presents today following recent MRI brain.  She denies new or progressive neurologic deficits today.  Continues to have frequent migraines.  Bernita Raisin has helped, she is following with [facility] headache team.  Denies seizures.  Medications: Current Outpatient Medications on File Prior to Visit  Medication Sig Dispense Refill   Continuous Glucose Receiver (DEXCOM G7 RECEIVER) DEVI 1 Units by Does not apply route See admin instructions.     Continuous Glucose Sensor (DEXCOM G7 SENSOR) MISC 1 Units by Does not apply route See admin instructions.     acetaminophen (TYLENOL) 325 MG tablet Take 2 tablets (650 mg total) by mouth every 6 (six) hours as needed for up to 30 doses for mild pain or moderate pain. 30 tablet 0   amLODipine (NORVASC) 5 MG tablet Take 5 mg by mouth daily.     Ascorbic Acid (VITAMIN C) 1000 MG tablet Take 1,000 mg by mouth daily.     atorvastatin (LIPITOR) 80 MG tablet Take 80 mg by mouth daily.     calcium carbonate (OSCAL) 1500 (600 Ca) MG TABS tablet Take by mouth daily.     Cholecalciferol (VITAMIN D-3) 125 MCG (5000 UT) TABS Take by mouth.     DEXILANT 60 MG capsule Take 1 capsule by mouth daily.     diphenhydrAMINE (BENADRYL) 25 mg capsule Take 25 mg by mouth every 6 (six) hours as needed.     EMGALITY 120 MG/ML SOAJ Inject 120 mg/mL into the skin See admin instructions. Once per month     ezetimibe (ZETIA) 10 MG tablet Take 1 tablet by mouth daily.     ferrous sulfate 325 (65 FE) MG tablet Take 325 mg by mouth daily with breakfast.     magnesium oxide (MAG-OX) 400 MG tablet Take 500  mg by mouth daily.     metFORMIN (GLUCOPHAGE) 500 MG tablet Take 500 mg by mouth 2 (two) times daily with a meal.     mirabegron ER (MYRBETRIQ) 50 MG TB24 tablet Take 50 mg by mouth daily.     Multiple Vitamin (MULTIVITAMIN WITH MINERALS) TABS Take 1 tablet by mouth daily.     pyridOXINE (VITAMIN B-6) 100 MG tablet Take 100 mg by mouth daily.     trospium (SANCTURA) 20 MG tablet Take 20 mg by mouth 2 (two) times daily.     Ubrogepant (UBRELVY) 100 MG TABS Take 100 mg by mouth daily as needed (migraines).     vitamin B-12 (CYANOCOBALAMIN) 100 MCG tablet Take 100 mcg by mouth daily.     No current facility-administered medications on file prior to visit.    Allergies:  Allergies  Allergen Reactions   Aspirin Nausea Only and Other (See Comments)    ulcers    Nsaids Other (See Comments)    Can cause ulcers   Tolmetin Other (See Comments)    Can cause ulcers   Past Medical History:  Past Medical History:  Diagnosis Date   Cataract    Dementia ([facility])    Depression    denies   Displaced fracture    left ring finger and small finger proximal  phalanx   Epilepsy, grand mal ([facility])    "last seizure ~ 2001" (06/16/2015)   High cholesterol    Hypertension    Migraine    "just about qd";  treated by Dr. Terrace Arabia (10/15/2016)   Migraines    Pneumonia    h/o   Pre-diabetes    she reports that she never took meds. for prediabetes, states MD has told her that she has corrected her situation with diet    Seizures ([facility])    "at one time; stopped in the 1990s" (10/15/2016)   Stress at home    Pt. reports that she has a lot of personal stress & she is aware that it might being playing a part in her Migraine heaaches     Past Surgical History:  Past Surgical History:  Procedure Laterality Date   EYE SURGERY Left 2018   left eye cataract removed   HERNIA REPAIR     INCISIONAL HERNIA REPAIR N/A 06/16/2015   Procedure: LAPAROSCOPIC INCISIONAL HERNIA WITH MESH;  Surgeon: Abigail Miyamoto, MD;   Location: [facility];  Service: General;  Laterality: N/A;   INCISIONAL HERNIA REPAIR  10/15/2016   INCISIONAL HERNIA REPAIR N/A 10/15/2016   Procedure: OPEN INCISIONAL HERNIA REPAIR WITH MESH;  Surgeon: Abigail Miyamoto, MD;  Location: [facility] OR;  Service: General;  Laterality: N/A;   INCISIONAL HERNIA REPAIR Left 11/05/2018   open; w/mesh   INGUINAL HERNIA REPAIR Left 11/05/2018   Procedure: OPEN REPAIR LEFT HERNIA ERAS PATHWAY;  Surgeon: Abigail Miyamoto, MD;  Location: [facility] OR;  Service: General;  Laterality: Left;  TAP BLOCK   INSERTION OF MESH N/A 06/16/2015   Procedure: INSERTION OF MESH;  Surgeon: Abigail Miyamoto, MD;  Location: [facility] OR;  Service: General;  Laterality: N/A;   VAGINAL HYSTERECTOMY  ~ 1992   Social History:  Social History   Socioeconomic History   Marital status: Married    Spouse name: Dorinda Hill   Number of children: 3   Years of education: college   Highest education level: Not on file  Occupational History    Comment: does not work  Tobacco Use   Smoking status: Never   Smokeless tobacco: Never  Vaping Use   Vaping Use: Never used  Substance and Sexual Activity   Alcohol use: No    Alcohol/week: 0.0 standard drinks of alcohol   Drug use: No   Sexual activity: Yes  Other Topics Concern   Not on file  Social History Narrative   Patient lives at home with her husband Dorinda Hill). Patient was a homemaker.   Right handed.   College education.   Caffeine- One cup of coffee daily.   Patient has three children.   Social Determinants of Health   Financial Resource Strain: Low Risk  (11/29/2020)   Overall Financial Resource Strain (CARDIA)    Difficulty of Paying Living Expenses: Not hard at all  Food Insecurity: No Food Insecurity (02/27/2022)   Hunger Vital Sign    Worried About Running Out of Food in the Last Year: Never true    Ran Out of Food in the Last Year: Never true  Transportation Needs: No Transportation Needs (02/27/2022)   PRAPARE - Doctor, general practice (Medical): No    Lack of Transportation (Non-Medical): No  Physical Activity: Insufficiently Active (11/26/2019)   Exercise Vital Sign    Days of Exercise per Week: 3 days    Minutes of Exercise per Session: 30 min  Stress: Stress Concern Present (  11/26/2019)   [employer] - Occupational Stress Questionnaire    Feeling of Stress : To some extent  Social Connections: Not on file  Intimate Partner Violence: Not At Risk (11/29/2020)   Humiliation, Afraid, Rape, and Kick questionnaire    Fear of Current or Ex-Partner: No    Emotionally Abused: No    Physically Abused: No    Sexually Abused: No   Family History:  Family History  Problem Relation Age of Onset   Lung cancer Mother    Kidney failure Father    Hypertension Other     Review of Systems: Constitutional: Doesn't report fevers, chills or abnormal weight loss Eyes: Doesn't report blurriness of vision Ears, nose, mouth, throat, and face: Doesn't report sore throat Respiratory: Doesn't report cough, dyspnea or wheezes Cardiovascular: Doesn't report palpitation, chest discomfort  Gastrointestinal:  Doesn't report nausea, constipation, diarrhea GU: Doesn't report incontinence Skin: Doesn't report skin rashes Neurological: Per HPI Musculoskeletal: Doesn't report joint pain Behavioral/Psych: Doesn't report anxiety  Physical Exam: Vitals:   06/24/23 1430  BP: (!) 190/96  Pulse: 94  Resp: 17  Temp: 97.6 F (36.4 C)  SpO2: 99%    KPS: 90. General: Alert, cooperative, pleasant, in no acute distress Head: Normal EENT: No conjunctival injection or scleral icterus.  Lungs: Resp effort normal Cardiac: Regular rate Abdomen: Non-distended abdomen Skin: No rashes cyanosis or petechiae. Extremities: No clubbing or edema  Neurologic Exam: Mental Status: Awake, alert, attentive to examiner. Oriented to self and environment. Language is fluent with intact comprehension.  Cranial  Nerves: Visual acuity is grossly normal. Visual fields are full. Extra-ocular movements intact. No ptosis. Face is symmetric Motor: Tone and bulk are normal. Power is full in both arms and legs. Reflexes are symmetric, no pathologic reflexes present.  Sensory: Intact to light touch Gait: Normal.   Labs: I have reviewed the data as listed    Component Value Date/Time   NA 136 02/07/2022 1019   K 4.2 02/07/2022 1019   CL 100 02/07/2022 1019   CO2 22 02/07/2022 1019   GLUCOSE 77 02/07/2022 1019   BUN 20 02/07/2022 1019   CREATININE 1.07 (H) 02/07/2022 1019   CALCIUM 10.0 02/07/2022 1019   PROT 7.3 02/07/2022 1019   ALBUMIN 4.3 02/07/2022 1019   AST 28 02/07/2022 1019   ALT 27 02/07/2022 1019   ALKPHOS 60 02/07/2022 1019   BILITOT 0.4 02/07/2022 1019   GFRNONAA 55 (L) 02/07/2022 1019   GFRAA >60 11/21/2019 1549   Lab Results  Component Value Date   WBC 10.3 02/07/2022   NEUTROABS 6.8 02/07/2022   HGB 13.8 02/07/2022   HCT 43.0 02/07/2022   MCV 93.1 02/07/2022   PLT 357 02/07/2022    Imaging: [facility] Clinician Interpretation: I have personally reviewed the CNS images as listed.  My interpretation, in the context of the patient's clinical presentation, is progressive disease  MR Brain W Wo Contrast  Result Date: 06/13/2023 CLINICAL DATA:  Brain/CNS neoplasm, monitor.  Follow-up meningioma. EXAM: MRI HEAD WITHOUT AND WITH CONTRAST TECHNIQUE: Multiplanar, multiecho pulse sequences of the brain and surrounding structures were obtained without and with intravenous contrast. CONTRAST:  6 mL Vueway. COMPARISON:  Brain MRI 05/01/2022. FINDINGS: Brain: Minimal increase in size of the 14 x 11 mm planum sphenoidale meningioma (image 68 series 15), previously measuring 13 x 10 mm. Minimal increase in size of the 11 x 10 mm meningioma overlying the left precentral gyrus (image 130 series 15), previously measuring 11  x 9 mm. No new enhancing lesions. No acute infarct or hemorrhage. No  hydrocephalus or extra-axial collection. Vascular: Normal flow voids and vessel enhancement. Skull and upper cervical spine: Normal marrow signal and enhancement. Sinuses/Orbits: Unremarkable. Other: None. IMPRESSION: Minimal increase in size of planum sphenoidale and left frontal meningiomas. Electronically Signed   By: Orvan Falconer M.D.   On: 06/13/2023 13:31      Assessment/Plan Meningioma [facility]) [D32.9]   LEETHA BERNHART is clinically stable today.  MRI demonstrates subtle growth of left frontal meningioma ~76mm over 1 year, and similar change within left convexity meningioma.    Will defer intervention given lack of symptoms and slow change over time.  Can continue to monitor radiographically at this time.    She will con't to follow with her headache neurologist for migraine therapy.  We ask that Lita Mains return to clinic in 12 months following next brain MRI, or sooner as needed.  All questions were answered. The patient knows to call the clinic with any problems, questions or concerns. No barriers to learning were detected.  The total time spent in the encounter was 30 minutes and more than 50% was on counseling and review of test results   Henreitta Leber, MD Medical Director of Neuro-Oncology [facility] [place] 06/24/23 2:31 PM

## 2023-07-25 ENCOUNTER — Other Ambulatory Visit: Payer: Self-pay | Admitting: Family Medicine

## 2023-07-25 DIAGNOSIS — R6889 Other general symptoms and signs: Secondary | ICD-10-CM

## 2023-08-23 ENCOUNTER — Ambulatory Visit
Admission: RE | Admit: 2023-08-23 | Discharge: 2023-08-23 | Disposition: A | Discharge status: Home / Self Care | Payer: Medicare Other | Source: Ambulatory Visit | Attending: Family Medicine | Admitting: Family Medicine

## 2023-08-23 DIAGNOSIS — R6889 Other general symptoms and signs: Secondary | ICD-10-CM

## 2023-10-18 ENCOUNTER — Other Ambulatory Visit: Payer: Self-pay | Admitting: Family Medicine

## 2023-10-18 DIAGNOSIS — Z1231 Encounter for screening mammogram for malignant neoplasm of breast: Secondary | ICD-10-CM

## 2023-11-14 ENCOUNTER — Ambulatory Visit
Admission: RE | Admit: 2023-11-14 | Discharge: 2023-11-14 | Disposition: A | Discharge status: Home / Self Care | Payer: Medicare HMO | Source: Ambulatory Visit | Attending: Family Medicine | Admitting: Family Medicine

## 2023-11-14 DIAGNOSIS — Z1231 Encounter for screening mammogram for malignant neoplasm of breast: Secondary | ICD-10-CM

## 2023-11-19 ENCOUNTER — Other Ambulatory Visit: Payer: Self-pay | Admitting: Family Medicine

## 2023-11-19 DIAGNOSIS — R928 Other abnormal and inconclusive findings on diagnostic imaging of breast: Secondary | ICD-10-CM

## 2023-12-10 ENCOUNTER — Ambulatory Visit
Admission: RE | Admit: 2023-12-10 | Discharge: 2023-12-10 | Disposition: A | Discharge status: Home / Self Care | Payer: Medicare HMO | Source: Ambulatory Visit | Attending: Family Medicine | Admitting: Family Medicine

## 2023-12-10 ENCOUNTER — Other Ambulatory Visit: Payer: Self-pay | Admitting: Family Medicine

## 2023-12-10 DIAGNOSIS — R928 Other abnormal and inconclusive findings on diagnostic imaging of breast: Secondary | ICD-10-CM

## 2023-12-10 DIAGNOSIS — N6320 Unspecified lump in the left breast, unspecified quadrant: Secondary | ICD-10-CM

## 2024-03-10 ENCOUNTER — Other Ambulatory Visit: Payer: Self-pay | Admitting: Family Medicine

## 2024-03-10 ENCOUNTER — Ambulatory Visit
Admission: RE | Admit: 2024-03-10 | Discharge: 2024-03-10 | Disposition: A | Discharge status: Home / Self Care | Payer: Medicare HMO | Source: Ambulatory Visit | Attending: Family Medicine | Admitting: Family Medicine

## 2024-03-10 DIAGNOSIS — N6320 Unspecified lump in the left breast, unspecified quadrant: Secondary | ICD-10-CM

## 2024-06-11 ENCOUNTER — Other Ambulatory Visit

## 2024-06-11 ENCOUNTER — Encounter

## 2024-06-12 ENCOUNTER — Encounter

## 2024-06-12 ENCOUNTER — Other Ambulatory Visit

## 2024-06-16 ENCOUNTER — Ambulatory Visit
Admission: RE | Admit: 2024-06-16 | Discharge: 2024-06-16 | Disposition: A | Discharge status: Home / Self Care | Payer: Medicare Other | Source: Ambulatory Visit | Attending: Internal Medicine | Admitting: Internal Medicine

## 2024-06-16 DIAGNOSIS — D329 Benign neoplasm of meninges, unspecified: Secondary | ICD-10-CM

## 2024-06-16 MED ORDER — GADOPICLENOL 0.5 MMOL/ML IV SOLN
6.0000 mL | Freq: Once | INTRAVENOUS | Status: AC | PRN
  Administered 2024-06-16: 6 mL via INTRAVENOUS

## 2024-06-19 ENCOUNTER — Other Ambulatory Visit: Payer: Self-pay | Admitting: Family Medicine

## 2024-06-19 ENCOUNTER — Ambulatory Visit
Admission: RE | Admit: 2024-06-19 | Discharge: 2024-06-19 | Disposition: A | Discharge status: Home / Self Care | Source: Ambulatory Visit | Attending: Family Medicine | Admitting: Family Medicine

## 2024-06-19 DIAGNOSIS — M7989 Other specified soft tissue disorders: Secondary | ICD-10-CM

## 2024-06-19 DIAGNOSIS — N6320 Unspecified lump in the left breast, unspecified quadrant: Secondary | ICD-10-CM

## 2024-06-22 ENCOUNTER — Inpatient Hospital Stay: Payer: Medicare Other | Attending: Internal Medicine | Admitting: Internal Medicine

## 2024-06-22 VITALS — BP 137/61 | HR 61 | Temp 97.3°F | Resp 18 | Wt 132.2 lb

## 2024-06-22 DIAGNOSIS — Z8249 Family history of ischemic heart disease and other diseases of the circulatory system: Secondary | ICD-10-CM | POA: Insufficient documentation

## 2024-06-22 DIAGNOSIS — R519 Headache, unspecified: Secondary | ICD-10-CM | POA: Diagnosis not present

## 2024-06-22 DIAGNOSIS — D320 Benign neoplasm of cerebral meninges: Secondary | ICD-10-CM | POA: Insufficient documentation

## 2024-06-22 DIAGNOSIS — N6489 Other specified disorders of breast: Secondary | ICD-10-CM | POA: Diagnosis not present

## 2024-06-22 DIAGNOSIS — Z803 Family history of malignant neoplasm of breast: Secondary | ICD-10-CM | POA: Insufficient documentation

## 2024-06-22 DIAGNOSIS — Z886 Allergy status to analgesic agent status: Secondary | ICD-10-CM | POA: Insufficient documentation

## 2024-06-22 DIAGNOSIS — D329 Benign neoplasm of meninges, unspecified: Secondary | ICD-10-CM | POA: Diagnosis not present

## 2024-06-22 DIAGNOSIS — I2510 Atherosclerotic heart disease of native coronary artery without angina pectoris: Secondary | ICD-10-CM | POA: Diagnosis not present

## 2024-06-22 DIAGNOSIS — Z90710 Acquired absence of both cervix and uterus: Secondary | ICD-10-CM | POA: Diagnosis not present

## 2024-06-22 DIAGNOSIS — Z801 Family history of malignant neoplasm of trachea, bronchus and lung: Secondary | ICD-10-CM | POA: Insufficient documentation

## 2024-06-22 DIAGNOSIS — Z79899 Other long term (current) drug therapy: Secondary | ICD-10-CM | POA: Diagnosis not present

## 2024-06-22 DIAGNOSIS — Z841 Family history of disorders of kidney and ureter: Secondary | ICD-10-CM | POA: Insufficient documentation

## 2024-06-22 DIAGNOSIS — G40909 Epilepsy, unspecified, not intractable, without status epilepticus: Secondary | ICD-10-CM

## 2024-06-22 DIAGNOSIS — E7800 Pure hypercholesterolemia, unspecified: Secondary | ICD-10-CM | POA: Diagnosis not present

## 2024-06-22 NOTE — Progress Notes (Signed)
 [facility] [address]  [place], [place] [address] [phone]   Interval Evaluation  Date of Service: 06/22/24 Patient Name: [name] Patient MRN: [id number] Patient DOB: [birth year] Provider: Arthea MARLA Manns, MD  Identifying Statement:  [name] is a 73 y.o. female with left frontal meningioma   Interval History:  [name] presents today following recent MRI brain.  She denies new or progressive neurologic deficits today.  At this point is now having daily headaches with migrainous features.  Dosing Eptizenumab for prevention, Ubrelvy and Advil for breakthrough headaches.  Denies seizures.  Medications: Current Outpatient Medications on File Prior to Visit  Medication Sig Dispense Refill   acetaminophen  (TYLENOL ) 325 MG tablet Take 2 tablets (650 mg total) by mouth every 6 (six) hours as needed for up to 30 doses for mild pain or moderate pain. 30 tablet 0   alendronate (FOSAMAX) 70 MG tablet Take 70 mg by mouth once a week.     amLODipine  (NORVASC ) 5 MG tablet Take 5 mg by mouth daily.     Ascorbic Acid (VITAMIN C) 1000 MG tablet Take 1,000 mg by mouth daily.     atorvastatin  (LIPITOR) 80 MG tablet Take 80 mg by mouth daily.     calcium  carbonate (OSCAL) 1500 (600 Ca) MG TABS tablet Take by mouth daily.     Cholecalciferol (VITAMIN D-3) 125 MCG (5000 UT) TABS Take by mouth.     Continuous Glucose Receiver (DEXCOM G7 RECEIVER) DEVI 1 Units by Does not apply route See admin instructions.     Continuous Glucose Sensor (DEXCOM G7 SENSOR) MISC 1 Units by Does not apply route See admin instructions.     DEXILANT 60 MG capsule Take 1 capsule by mouth daily.     diphenhydrAMINE  (BENADRYL ) 25 mg capsule Take 25 mg by mouth every 6 (six) hours as needed.     EMGALITY 120 MG/ML SOAJ Inject 120 mg/mL into the skin See admin instructions. Once per month     ezetimibe (ZETIA) 10 MG tablet Take 1 tablet by mouth daily.     ferrous  sulfate 325 (65 FE) MG tablet Take 325 mg by mouth daily with breakfast.     gabapentin  (NEURONTIN ) 300 MG capsule Take 300 mg by mouth 2 (two) times daily.     magnesium  oxide (MAG-OX) 400 MG tablet Take 500 mg by mouth daily.     metFORMIN (GLUCOPHAGE) 500 MG tablet Take 500 mg by mouth 2 (two) times daily with a meal.     mirabegron  ER (MYRBETRIQ ) 50 MG TB24 tablet Take 50 mg by mouth daily.     Multiple Vitamin (MULTIVITAMIN WITH MINERALS) TABS Take 1 tablet by mouth daily.     pyridOXINE (VITAMIN B-6) 100 MG tablet Take 100 mg by mouth daily.     traZODone (DESYREL) 100 MG tablet Take 150 mg by mouth at bedtime.     trospium (SANCTURA) 20 MG tablet Take 20 mg by mouth 2 (two) times daily.     Ubrogepant (UBRELVY) 100 MG TABS Take 100 mg by mouth daily as needed (migraines).     vitamin B-12 (CYANOCOBALAMIN) 100 MCG tablet Take 100 mcg by mouth daily.     No current facility-administered medications on file prior to visit.    Allergies:  Allergies  Allergen Reactions   Aspirin Nausea Only and Other (See Comments)    ulcers    Nsaids Other (See Comments)  Can cause ulcers   Tolmetin Other (See Comments)    Can cause ulcers   Past Medical History:  Past Medical History:  Diagnosis Date   Cataract    Dementia (HCC)    Depression    denies   Displaced fracture    left ring finger and small finger proximal phalanx   Epilepsy, grand mal (HCC)    last seizure ~ 2001 (06/16/2015)   High cholesterol    Hypertension    Migraine    just about qd;  treated by Dr. Onita (10/15/2016)   Migraines    Pneumonia    h/o   Pre-diabetes    she reports that she never took meds. for prediabetes, states MD has told her that she has corrected her situation with diet    Seizures (HCC)    at one time; stopped in the 1990s (10/15/2016)   Stress at home    Pt. reports that she has a lot of personal stress & she is aware that it might being playing a part in her Migraine heaaches      Past Surgical History:  Past Surgical History:  Procedure Laterality Date   EYE SURGERY Left 2018   left eye cataract removed   HERNIA REPAIR     INCISIONAL HERNIA REPAIR N/A 06/16/2015   Procedure: LAPAROSCOPIC INCISIONAL HERNIA WITH MESH;  Surgeon: Vicenta Poli, MD;  Location: [facility] OR;  Service: General;  Laterality: N/A;   INCISIONAL HERNIA REPAIR  10/15/2016   INCISIONAL HERNIA REPAIR N/A 10/15/2016   Procedure: OPEN INCISIONAL HERNIA REPAIR WITH MESH;  Surgeon: Vicenta Poli, MD;  Location: [facility] OR;  Service: General;  Laterality: N/A;   INCISIONAL HERNIA REPAIR Left 11/05/2018   open; w/mesh   INGUINAL HERNIA REPAIR Left 11/05/2018   Procedure: OPEN REPAIR LEFT HERNIA ERAS PATHWAY;  Surgeon: Poli Vicenta, MD;  Location: [facility] OR;  Service: General;  Laterality: Left;  TAP BLOCK   INSERTION OF MESH N/A 06/16/2015   Procedure: INSERTION OF MESH;  Surgeon: Vicenta Poli, MD;  Location: [facility] OR;  Service: General;  Laterality: N/A;   VAGINAL HYSTERECTOMY  ~ 1992   Social History:  Social History   Socioeconomic History   Marital status: Married    Spouse name: Nancyann   Number of children: 3   Years of education: college   Highest education level: Not on file  Occupational History    Comment: does not work  Tobacco Use   Smoking status: Never   Smokeless tobacco: Never  Vaping Use   Vaping status: Never Used  Substance and Sexual Activity   Alcohol use: No    Alcohol/week: 0.0 standard drinks of alcohol   Drug use: No   Sexual activity: Yes  Other Topics Concern   Not on file  Social History Narrative   Patient lives at home with her husband [name]). Patient was a homemaker.   Right handed.   College education.   Caffeine - One cup of coffee daily.   Patient has three children.   Social Drivers of Corporate investment banker Strain: Low Risk  (04/23/2023)   Received from [facility]   Overall Financial Resource Strain (CARDIA)    Difficulty of Paying  Living Expenses: Not hard at all  Food Insecurity: No Food Insecurity (04/23/2023)   Received from [facility]   Hunger Vital Sign    Within the past 12 months, you worried that your food would run out before you got the money to buy  more.: Never true    Within the past 12 months, the food you bought just didn't last and you didn't have money to get more.: Never true  Transportation Needs: No Transportation Needs (04/23/2023)   Received from [facility]   PRAPARE - Transportation    Lack of Transportation (Medical): No    Lack of Transportation (Non-Medical): No  Physical Activity: Insufficiently Active (11/26/2019)   Exercise Vital Sign    Days of Exercise per Week: 3 days    Minutes of Exercise per Session: 30 min  Stress: Stress Concern Present (11/26/2019)   [employer] of Occupational Health - Occupational Stress Questionnaire    Feeling of Stress : To some extent  Social Connections: Unknown (04/29/2022)   Received from [facility]   Social Network    Social Network: Not on file  Intimate Partner Violence: Unknown (03/26/2022)   Received from [facility]   HITS    Physically Hurt: Not on file    Insult or Talk Down To: Not on file    Threaten Physical Harm: Not on file    Scream or Curse: Not on file   Family History:  Family History  Problem Relation Age of Onset   Lung cancer Mother    Kidney failure Father    Breast cancer Cousin    Hypertension Other     Review of Systems: Constitutional: Doesn't report fevers, chills or abnormal weight loss Eyes: Doesn't report blurriness of vision Ears, nose, mouth, throat, and face: Doesn't report sore throat Respiratory: Doesn't report cough, dyspnea or wheezes Cardiovascular: Doesn't report palpitation, chest discomfort  Gastrointestinal:  Doesn't report nausea, constipation, diarrhea GU: Doesn't report incontinence Skin: Doesn't report skin rashes Neurological: Per HPI Musculoskeletal: Doesn't report joint  pain Behavioral/Psych: Doesn't report anxiety  Physical Exam: Vitals:   06/22/24 0955  BP: 137/61  Pulse: 61  Resp: 18  Temp: (!) 97.3 F (36.3 C)  SpO2: 99%   KPS: 90. General: Alert, cooperative, pleasant, in no acute distress Head: Normal EENT: No conjunctival injection or scleral icterus.  Lungs: Resp effort normal Cardiac: Regular rate Abdomen: Non-distended abdomen Skin: No rashes cyanosis or petechiae. Extremities: No clubbing or edema  Neurologic Exam: Mental Status: Awake, alert, attentive to examiner. Oriented to self and environment. Language is fluent with intact comprehension.  Cranial Nerves: Visual acuity is grossly normal. Visual fields are full. Extra-ocular movements intact. No ptosis. Face is symmetric Motor: Tone and bulk are normal. Power is full in both arms and legs. Reflexes are symmetric, no pathologic reflexes present.  Sensory: Intact to light touch Gait: Normal.   Labs: I have reviewed the data as listed    Component Value Date/Time   NA 136 02/07/2022 1019   K 4.2 02/07/2022 1019   CL 100 02/07/2022 1019   CO2 22 02/07/2022 1019   GLUCOSE 77 02/07/2022 1019   BUN 20 02/07/2022 1019   CREATININE 1.07 (H) 02/07/2022 1019   CALCIUM  10.0 02/07/2022 1019   PROT 7.3 02/07/2022 1019   ALBUMIN 4.3 02/07/2022 1019   AST 28 02/07/2022 1019   ALT 27 02/07/2022 1019   ALKPHOS 60 02/07/2022 1019   BILITOT 0.4 02/07/2022 1019   GFRNONAA 55 (L) 02/07/2022 1019   GFRAA >60 11/21/2019 1549   Lab Results  Component Value Date   WBC 10.3 02/07/2022   NEUTROABS 6.8 02/07/2022   HGB 13.8 02/07/2022   HCT 43.0 02/07/2022   MCV 93.1 02/07/2022   PLT 357 02/07/2022  Imaging: [facility] Clinician Interpretation: I have personally reviewed the CNS images as listed.  My interpretation, in the context of the patient's clinical presentation, is progressive disease  MM 3D DIAGNOSTIC MAMMOGRAM UNILATERAL LEFT BREAST Result Date: 06/19/2024 CLINICAL DATA:   Short-term follow-up probable fat necrosis left breast. EXAM: DIGITAL DIAGNOSTIC UNILATERAL LEFT MAMMOGRAM WITH TOMOSYNTHESIS AND CAD; ULTRASOUND LEFT BREAST LIMITED TECHNIQUE: Left digital diagnostic mammography and breast tomosynthesis was performed. The images were evaluated with computer-aided detection. ; Targeted ultrasound examination of the left breast was performed. COMPARISON:  Previous exam(s). ACR Breast Density Category b: There are scattered areas of fibroglandular density. FINDINGS: Cc and MLO views of the left breast are submitted. No suspicious abnormality is identified. Previously noted asymmetry in the upper-outer quadrant left breast is less well seen on the current exam. Targeted ultrasound is performed, showing area of probable fat necrosis at the left breast 1 o'clock 8 cm from nipple is unchanged compared prior exams measuring 0 4 x 0.3 x 0.4 cm. IMPRESSION: Probable benign findings. RECOMMENDATION: Recommend bilateral diagnostic mammogram and left breast ultrasound in December of 2025. I have discussed the findings and recommendations with the patient. If applicable, a reminder letter will be sent to the patient regarding the next appointment. BI-RADS CATEGORY  3: Probably benign. Electronically Signed   By: Craig Farr M.D.   On: 06/19/2024 14:29   US  LIMITED ULTRASOUND INCLUDING AXILLA LEFT BREAST  Result Date: 06/19/2024 CLINICAL DATA:  Short-term follow-up probable fat necrosis left breast. EXAM: DIGITAL DIAGNOSTIC UNILATERAL LEFT MAMMOGRAM WITH TOMOSYNTHESIS AND CAD; ULTRASOUND LEFT BREAST LIMITED TECHNIQUE: Left digital diagnostic mammography and breast tomosynthesis was performed. The images were evaluated with computer-aided detection. ; Targeted ultrasound examination of the left breast was performed. COMPARISON:  Previous exam(s). ACR Breast Density Category b: There are scattered areas of fibroglandular density. FINDINGS: Cc and MLO views of the left breast are submitted. No  suspicious abnormality is identified. Previously noted asymmetry in the upper-outer quadrant left breast is less well seen on the current exam. Targeted ultrasound is performed, showing area of probable fat necrosis at the left breast 1 o'clock 8 cm from nipple is unchanged compared prior exams measuring 0 4 x 0.3 x 0.4 cm. IMPRESSION: Probable benign findings. RECOMMENDATION: Recommend bilateral diagnostic mammogram and left breast ultrasound in December of 2025. I have discussed the findings and recommendations with the patient. If applicable, a reminder letter will be sent to the patient regarding the next appointment. BI-RADS CATEGORY  3: Probably benign. Electronically Signed   By: Craig Farr M.D.   On: 06/19/2024 14:29   MR BRAIN W WO CONTRAST Result Date: 06/16/2024 CLINICAL DATA:  Brain/CNS neoplasm. Assess treatment response. Follow-up meningioma. EXAM: MRI HEAD WITHOUT AND WITH CONTRAST TECHNIQUE: Multiplanar, multiecho pulse sequences of the brain and surrounding structures were obtained without and with intravenous contrast. CONTRAST:  6 cc Vueway  COMPARISON:  06/03/2023 FINDINGS: Brain: Meningioma the planum sphenoidale to the left of midline measures 11 mm front to back, 7 mm cephalo caudal and 14 mm right to left. This is unchanged since last year. Mass-effect upon the inferior frontal lobe is unchanged without brain edema. Second meningioma along the lateral convexity at the left frontoparietal junction region shows a maximal diameter of 13 mm at its base with a thickness of 7.5 mm. This is also unchanged since last year. No significant mass-effect upon the brain. No third lesion. Brain does not show any evidence of accelerated atrophy or prior stroke. No hydrocephalus. Vascular: Major  vessels at the base of the brain show flow. Skull and upper cervical spine: Negative Sinuses/Orbits: Clear/normal Other: None IMPRESSION: 1. No change since last year. 2. 11 x 7 x 14 mm meningioma of the planum  sphenoidale to the left of midline. Mass-effect upon the inferior frontal lobe without brain edema. 3. 13 x 7.5 mm meningioma along the lateral convexity at the left frontoparietal junction region. No significant mass-effect upon the brain. Electronically Signed   By: Oneil Officer M.D.   On: 06/16/2024 13:53      Assessment/Plan Meningioma [facility]) [D32.9]   SPIRIT WERNLI is clinically stable today.  MRI demonstrates overall stability of left frontal meningioma, yet untreated.  Will defer intervention given lack of symptoms and minimal change over time.  Can continue to monitor radiographically at this time.    Did discuss and recommend a sleep study given chronic daily headache, daytime sleepiness, prior study in 2016 demonstrating borderline OSA.  She will con't to follow with her headache neurologist for migraine therapy.  We ask that Earnie LITTIE Luster return to clinic in 12 months following next brain MRI, or sooner as needed.  We will also call her with sleep study results once available.  All questions were answered. The patient knows to call the clinic with any problems, questions or concerns. No barriers to learning were detected.  The total time spent in the encounter was 40 minutes and more than 50% was on counseling and review of test results   Arthea MARLA Manns, MD Medical Director of Neuro-Oncology [facility] 06/22/24 9:53 AM

## 2024-06-29 ENCOUNTER — Telehealth: Payer: Self-pay | Admitting: *Deleted

## 2024-06-29 NOTE — Telephone Encounter (Signed)
 PC to [facility], they can see patient's referral & will contact patient in 6-8 weeks for study.

## 2024-08-14 ENCOUNTER — Telehealth: Payer: Self-pay | Admitting: Internal Medicine

## 2024-08-14 NOTE — Telephone Encounter (Signed)
 Rescheduled appointment per 8/22 secure chat from the Dr.Vaslow and his nurse Sherrilyn PEAK. Called and left VM with appointment details for the patient.

## 2024-08-17 ENCOUNTER — Inpatient Hospital Stay: Admitting: Internal Medicine

## 2024-09-07 ENCOUNTER — Inpatient Hospital Stay: Attending: Internal Medicine | Admitting: Internal Medicine

## 2024-09-21 ENCOUNTER — Telehealth: Payer: Self-pay | Admitting: *Deleted

## 2024-09-21 NOTE — Telephone Encounter (Signed)
 Patient called asking for when she would be seen next.  Found sleep study notes scanned in under media from [facility].  Dr Buckley agreed to phone visit.

## 2024-09-22 ENCOUNTER — Inpatient Hospital Stay (HOSPITAL_BASED_OUTPATIENT_CLINIC_OR_DEPARTMENT_OTHER): Admitting: Internal Medicine

## 2024-09-22 DIAGNOSIS — D329 Benign neoplasm of meninges, unspecified: Secondary | ICD-10-CM | POA: Diagnosis not present

## 2024-09-22 NOTE — Progress Notes (Signed)
 I connected with [name] on 09/22/24 at 10:15 AM EDT by telephone visit and verified that I am speaking with the correct person using two identifiers.  I discussed the limitations, risks, security and privacy concerns of performing an evaluation and management service by telemedicine and the availability of in-person appointments. I also discussed with the patient that there may be a patient responsible charge related to this service. The patient expressed understanding and agreed to proceed.  Other persons participating in the visit and their role in the encounter:  n/a  Patient's location:  Home Provider's location:  Office Chief Complaint:  Meningioma [facility]) - Plan: MR BRAIN W WO CONTRAST  History of Present Ilness: [name] reports no clinical changes today.  No issues with sleep study.  Migraines are unchanged from prior, she continues to follow with [facility] headache team.    Observations: Language and cognition at baseline    Media Information     Document Information  AMB Correspondence  [facility]  08/31/2024 15:35  Attached To:  [name]  Source Information  Default, Provider, MD   Assessment and Plan: Meningioma [facility]) - Plan: MR BRAIN W WO CONTRAST  Clinically stable today.  Sleep study did not demonstrates any sleep disordered breathing.  She will con't to follow with her headache team.  Follow Up Instructions: We ask that [name] return to clinic in 9 months following next brain MRI, or sooner as needed.  I discussed the assessment and treatment plan with the patient.  The patient was provided an opportunity to ask questions and all were answered.  The patient agreed with the plan and demonstrated understanding of the instructions.    The patient was advised to call back or seek an in-person evaluation if the symptoms worsen or if the condition fails to improve as anticipated.    [name] K [name], MD   I provided 20 minutes of  non face-to-face telephone visit time during this encounter, and > 50% was spent counseling as documented under my assessment & plan.

## 2024-10-01 ENCOUNTER — Telehealth: Payer: Self-pay | Admitting: Internal Medicine

## 2024-10-01 NOTE — Telephone Encounter (Signed)
 Scheduled patient for next appointment. Called and spoke with the patient, she is aware.

## 2024-12-17 ENCOUNTER — Other Ambulatory Visit: Payer: Self-pay | Admitting: Medical Genetics

## 2024-12-21 ENCOUNTER — Ambulatory Visit
Admission: RE | Admit: 2024-12-21 | Discharge: 2024-12-21 | Disposition: A | Source: Ambulatory Visit | Attending: Family Medicine | Admitting: Family Medicine

## 2024-12-21 DIAGNOSIS — M7989 Other specified soft tissue disorders: Secondary | ICD-10-CM

## 2024-12-21 DIAGNOSIS — N6320 Unspecified lump in the left breast, unspecified quadrant: Secondary | ICD-10-CM

## 2025-01-18 ENCOUNTER — Other Ambulatory Visit

## 2025-06-17 ENCOUNTER — Encounter (HOSPITAL_COMMUNITY)

## 2025-06-21 ENCOUNTER — Inpatient Hospital Stay: Admitting: Internal Medicine
